# Patient Record
Sex: Female | Born: 1965 | Race: Black or African American | Hispanic: No | Smoking: Never smoker
Health system: Southern US, Community
[De-identification: ages and names within clinical notes are randomized; demographics above are authoritative.]

## PROBLEM LIST (undated history)

## (undated) DIAGNOSIS — R928 Other abnormal and inconclusive findings on diagnostic imaging of breast: Secondary | ICD-10-CM

## (undated) DIAGNOSIS — E669 Obesity, unspecified: Secondary | ICD-10-CM

## (undated) DIAGNOSIS — I10 Essential (primary) hypertension: Secondary | ICD-10-CM

## (undated) DIAGNOSIS — Z1389 Encounter for screening for other disorder: Secondary | ICD-10-CM

## (undated) DIAGNOSIS — Z1239 Encounter for other screening for malignant neoplasm of breast: Secondary | ICD-10-CM

## (undated) DIAGNOSIS — O24419 Gestational diabetes mellitus in pregnancy, unspecified control: Secondary | ICD-10-CM

## (undated) DIAGNOSIS — L732 Hidradenitis suppurativa: Secondary | ICD-10-CM

## (undated) HISTORY — DX: Encounter for other screening for malignant neoplasm of breast: Z12.39

## (undated) HISTORY — DX: Essential (primary) hypertension: I10

## (undated) HISTORY — DX: Encounter for screening for other disorder: Z13.89

## (undated) HISTORY — DX: Obesity, unspecified: E66.9

## (undated) HISTORY — DX: Other abnormal and inconclusive findings on diagnostic imaging of breast: R92.8

## (undated) HISTORY — DX: Gestational diabetes mellitus in pregnancy, unspecified control: O24.419

## (undated) HISTORY — DX: Hidradenitis suppurativa: L73.2

---

## 1997-07-30 HISTORY — PX: BREAST REDUCTION SURGERY: SHX8

## 1998-04-15 ENCOUNTER — Ambulatory Visit (HOSPITAL_COMMUNITY): Admission: RE | Admit: 1998-04-15 | Discharge: 1998-04-15 | Payer: Self-pay | Admitting: Specialist

## 1998-04-23 ENCOUNTER — Encounter: Payer: Self-pay | Admitting: Specialist

## 1998-04-23 ENCOUNTER — Ambulatory Visit (HOSPITAL_COMMUNITY): Admission: RE | Admit: 1998-04-23 | Discharge: 1998-04-24 | Payer: Self-pay | Admitting: Specialist

## 1998-07-30 HISTORY — PX: REDUCTION MAMMAPLASTY: SUR839

## 2006-03-06 ENCOUNTER — Ambulatory Visit: Payer: Self-pay | Admitting: Family Medicine

## 2010-05-30 ENCOUNTER — Ambulatory Visit: Payer: Self-pay | Admitting: Internal Medicine

## 2010-06-21 ENCOUNTER — Ambulatory Visit: Payer: Self-pay | Admitting: Internal Medicine

## 2010-06-29 ENCOUNTER — Ambulatory Visit: Payer: Self-pay | Admitting: Internal Medicine

## 2010-07-30 HISTORY — PX: ABDOMINAL HYSTERECTOMY: SHX81

## 2011-07-31 DIAGNOSIS — L732 Hidradenitis suppurativa: Secondary | ICD-10-CM

## 2011-07-31 DIAGNOSIS — R928 Other abnormal and inconclusive findings on diagnostic imaging of breast: Secondary | ICD-10-CM

## 2011-07-31 HISTORY — DX: Other abnormal and inconclusive findings on diagnostic imaging of breast: R92.8

## 2011-07-31 HISTORY — DX: Hidradenitis suppurativa: L73.2

## 2012-01-21 ENCOUNTER — Ambulatory Visit: Payer: Self-pay | Admitting: Obstetrics & Gynecology

## 2012-01-21 LAB — CBC
HCT: 27.3 % — ABNORMAL LOW (ref 35.0–47.0)
HGB: 8.2 g/dL — ABNORMAL LOW (ref 12.0–16.0)
MCH: 18.9 pg — ABNORMAL LOW (ref 26.0–34.0)
MCHC: 29.9 g/dL — ABNORMAL LOW (ref 32.0–36.0)
RBC: 4.31 10*6/uL (ref 3.80–5.20)
RDW: 18 % — ABNORMAL HIGH (ref 11.5–14.5)
WBC: 5.4 10*3/uL (ref 3.6–11.0)

## 2012-01-21 LAB — PREGNANCY, URINE: Pregnancy Test, Urine: NEGATIVE m[IU]/mL

## 2012-01-28 HISTORY — PX: PARTIAL HYSTERECTOMY: SHX80

## 2012-01-29 ENCOUNTER — Ambulatory Visit: Payer: Self-pay | Admitting: Obstetrics & Gynecology

## 2012-01-29 LAB — PREGNANCY, URINE: Pregnancy Test, Urine: NEGATIVE m[IU]/mL

## 2012-01-30 LAB — PATHOLOGY REPORT

## 2012-01-30 LAB — HEMOGLOBIN: HGB: 6.6 g/dL — ABNORMAL LOW (ref 12.0–16.0)

## 2012-03-06 ENCOUNTER — Ambulatory Visit: Payer: Self-pay | Admitting: Family Medicine

## 2012-03-14 ENCOUNTER — Ambulatory Visit: Payer: Self-pay | Admitting: Family Medicine

## 2012-04-14 ENCOUNTER — Ambulatory Visit: Payer: Self-pay | Admitting: General Surgery

## 2012-04-14 HISTORY — PX: BREAST BIOPSY: SHX20

## 2012-04-15 LAB — PATHOLOGY REPORT

## 2012-08-21 IMAGING — US ULTRASOUND LEFT BREAST
1 series · 14 of 17 positions shown · non-contrast
Comparison: none

REASON FOR EXAM: av lt asymmetric density
COMMENTS:

PROCEDURE:     US  - US BREAST LEFT  - March 14, 2012 [DATE]
RESULT:
The left breast is evaluated in the region of interest from the 3 o'clock to
the 6 o'clock position. No solid or cystic sonographic abnormalities are
identified.

[Series 1: ultrasound left breast · 0.10mm/px · 14 of 17 slices shown]
[im 1/17]
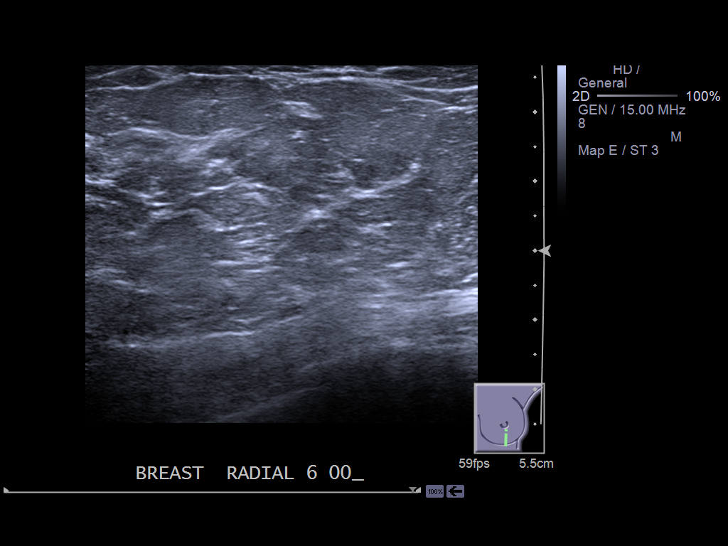
[im 2/17]
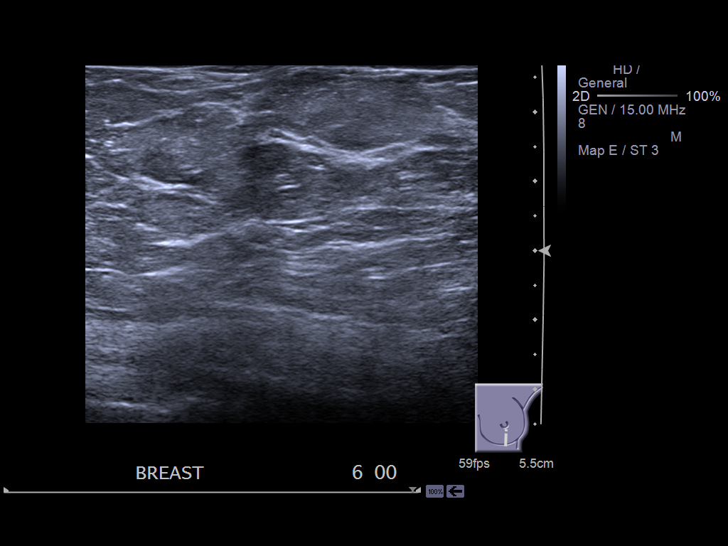
[im 4/17]
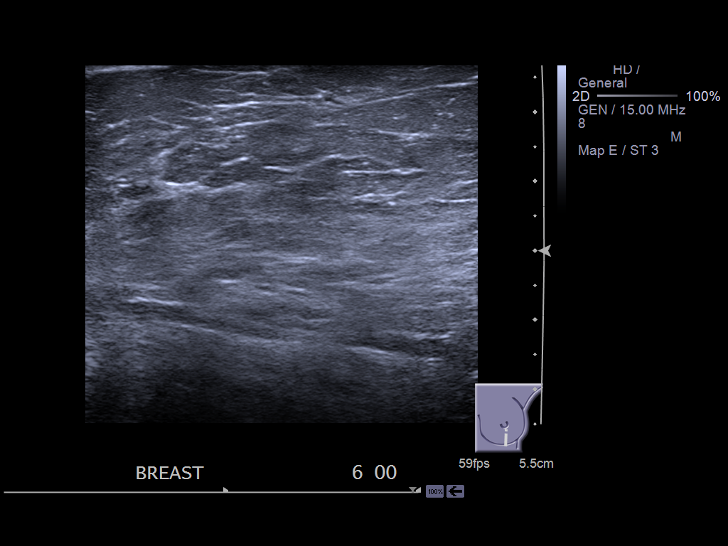
[im 5/17]
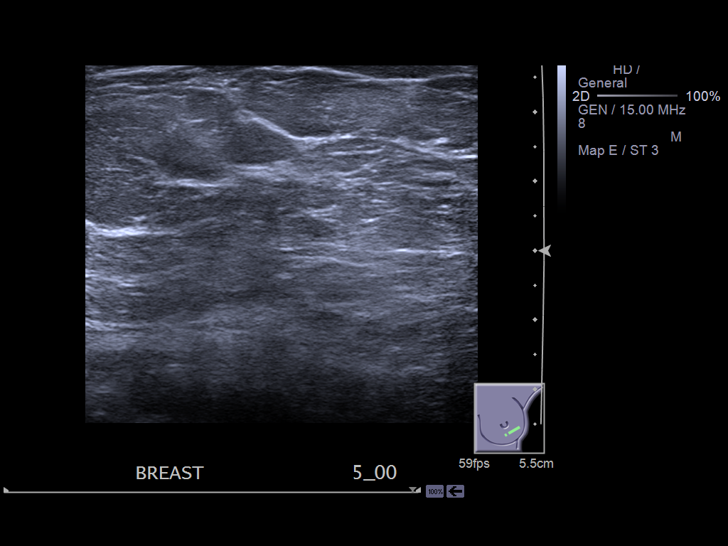
[im 6/17]
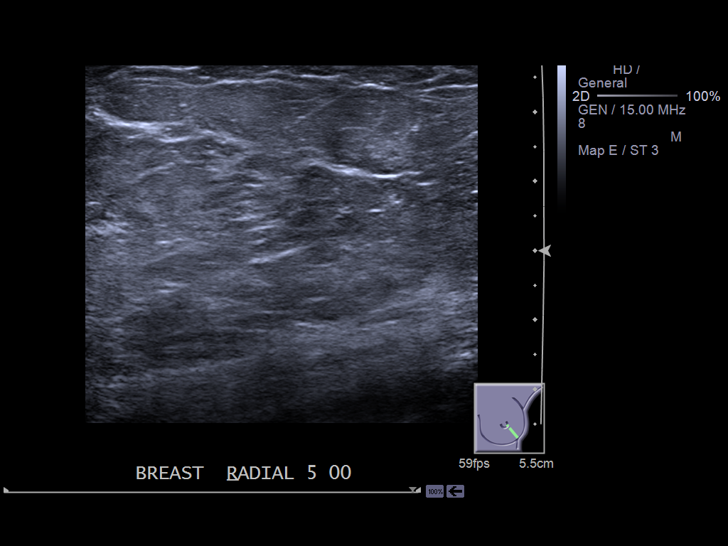
[im 7/17]
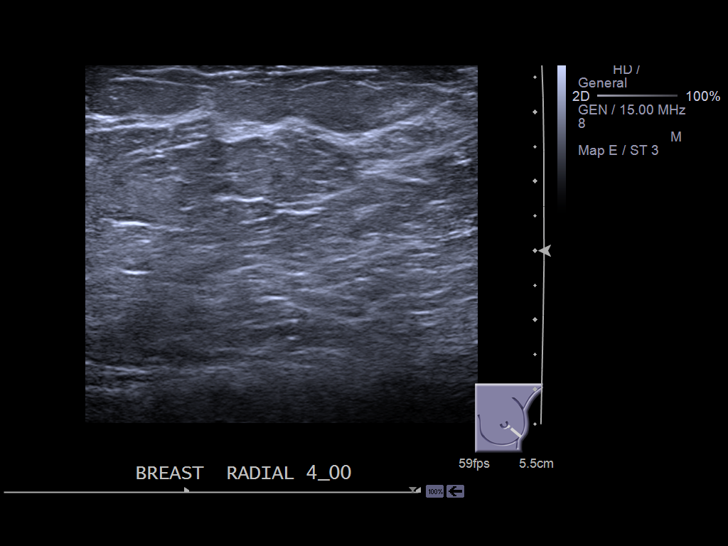
[im 8/17]
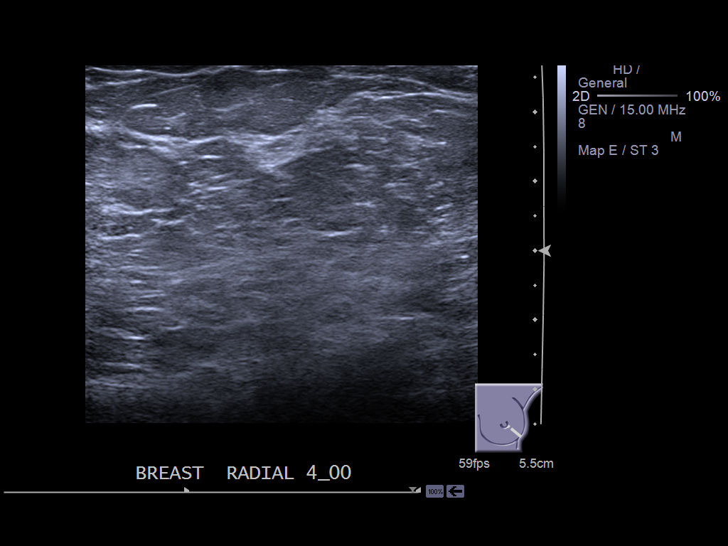
[im 10/17]
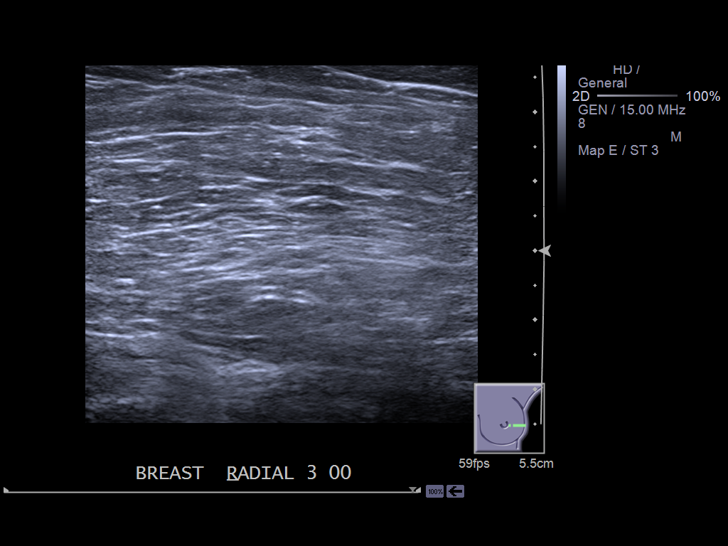
[im 11/17]
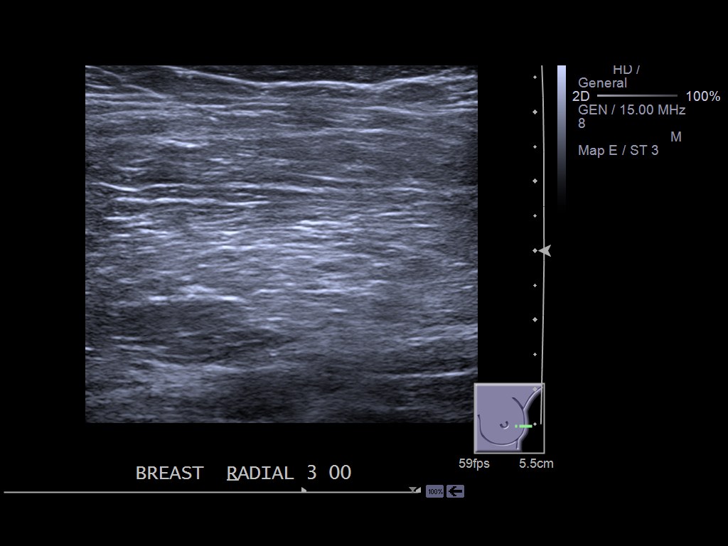
[im 12/17]
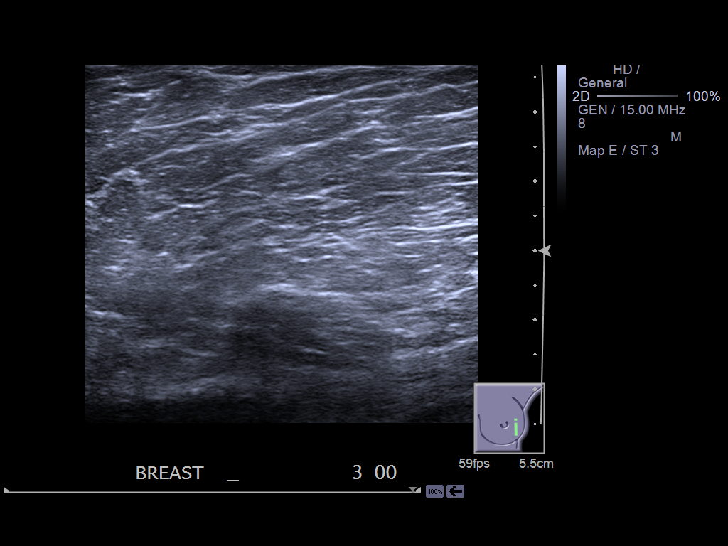
[im 13/17]
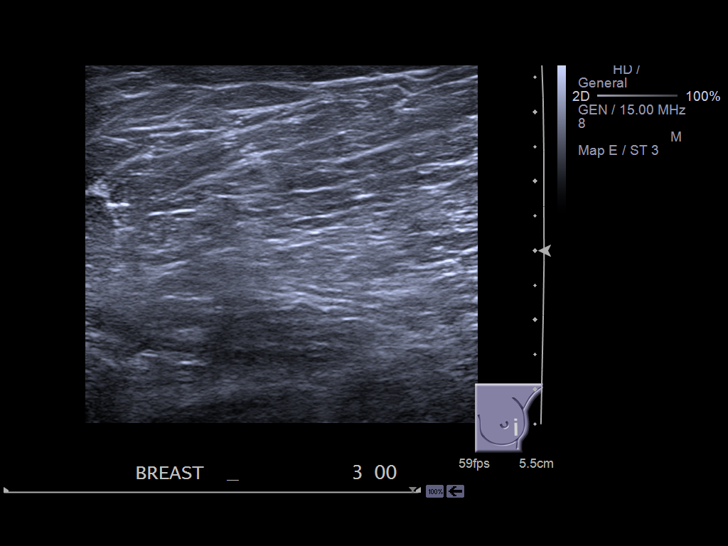
[im 14/17]
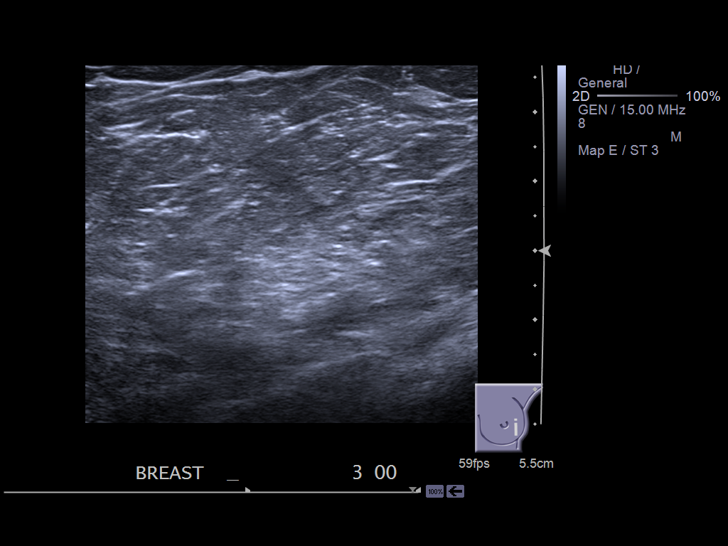
[im 16/17]
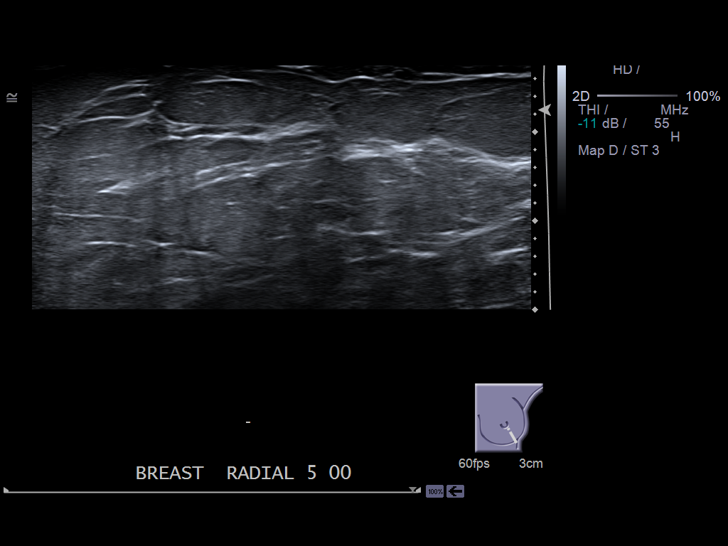
[im 17/17]
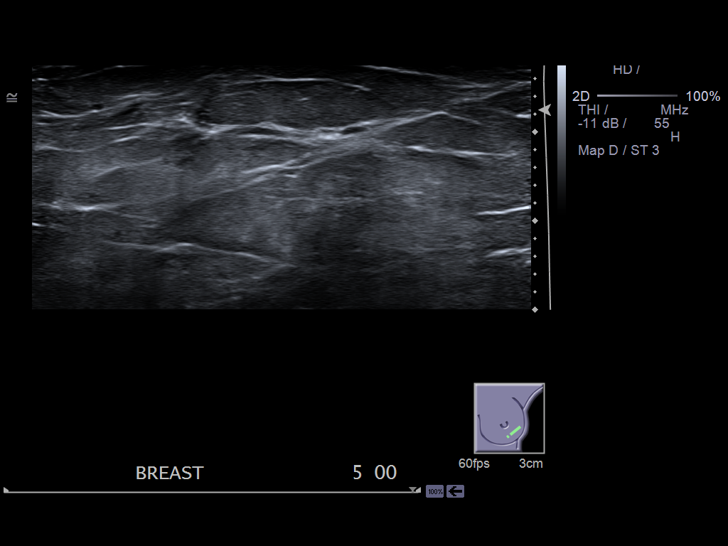

[14 of 17 positions shown; findings below may reference images not displayed]

IMPRESSION: Unremarkable Focused Left Breast Ultrasound. Please refer to
the additional radiographic view dictation for completed discussion.

## 2012-09-06 ENCOUNTER — Encounter: Payer: Self-pay | Admitting: General Surgery

## 2012-10-27 ENCOUNTER — Ambulatory Visit: Payer: Self-pay | Admitting: General Surgery

## 2012-11-26 ENCOUNTER — Ambulatory Visit: Payer: Self-pay | Admitting: General Surgery

## 2012-12-02 ENCOUNTER — Encounter: Payer: Self-pay | Admitting: General Surgery

## 2012-12-03 ENCOUNTER — Encounter: Payer: Self-pay | Admitting: *Deleted

## 2012-12-03 ENCOUNTER — Ambulatory Visit: Payer: Self-pay | Admitting: Family Medicine

## 2013-02-09 ENCOUNTER — Ambulatory Visit: Payer: Self-pay | Admitting: Family Medicine

## 2013-06-10 ENCOUNTER — Ambulatory Visit: Payer: Self-pay | Admitting: Family Medicine

## 2014-05-03 ENCOUNTER — Ambulatory Visit: Payer: Self-pay | Admitting: General Surgery

## 2014-05-11 ENCOUNTER — Ambulatory Visit (INDEPENDENT_AMBULATORY_CARE_PROVIDER_SITE_OTHER): Payer: BC Managed Care – PPO | Admitting: General Surgery

## 2014-05-11 ENCOUNTER — Encounter: Payer: Self-pay | Admitting: General Surgery

## 2014-05-11 VITALS — BP 130/80 | HR 86 | Resp 14 | Ht 65.0 in | Wt 201.0 lb

## 2014-05-11 DIAGNOSIS — L732 Hidradenitis suppurativa: Secondary | ICD-10-CM

## 2014-05-11 NOTE — Patient Instructions (Signed)
Patient to return in 2-3 weeks for follow up. The patient is aware to call back for any questions or concerns.

## 2014-05-11 NOTE — Progress Notes (Signed)
Patient ID: Sara Foley, female   DOB: [birth year], 48 y.o.   MRN: [id number]  Chief Complaint  Patient presents with  . Mass    left inner thigh    HPI Sara Foley is a 48 y.o. female.  Here today for evaluation of a left inner thigh nodule. States that she noticed it about 3-4 weeks ago. She states the area is draining and sore at times. The area has gotten larger. Currently on Augmentin for the past two weeks. She reports this problem has been recurring for quit sometime. In the past she had similar problems in right groin but resolved fully.   HPI  Past Medical History  Diagnosis Date  . Gestational diabetes     1996  . Unspecified essential hypertension   . Hidradenitis 2013    hidradentitis suppurativa  . Abnormal mammogram, unspecified 2013    illdefined left inferior density; ultrasound was normal  . Breast screening, unspecified   . Obesity, unspecified   . Screening for obesity     Past Surgical History  Procedure Laterality Date  . Partial hysterectomy  July 2013    due to fibroid tumors  . Breast reduction surgery  1999  . Breast biopsy Left 04-14-12    left breast stereotactic biopsy; fibroadenoma  . Abdominal hysterectomy  2012    partial    Family History  Problem Relation Age of Onset  . Prostate cancer Other   . Colon cancer Maternal Uncle     Social History History  Substance Use Topics  . Smoking status: Never Smoker   . Smokeless tobacco: Not on file  . Alcohol Use: 0.5 oz/week    1 drink(s) per week    No Known Allergies  Current Outpatient Prescriptions  Medication Sig Dispense Refill  . amoxicillin-clavulanate (AUGMENTIN) 875-125 MG per tablet       . ibuprofen (ADVIL,MOTRIN) 200 MG tablet Take 400 mg by mouth as needed.       No current facility-administered medications for this visit.    Review of Systems Review of Systems  Constitutional: Negative.   Respiratory: Negative.   Cardiovascular: Negative.     Blood pressure  130/80, pulse 86, resp. rate 14, height 5\' 5"  (1.651 m), weight 201 lb (91.173 kg).  Physical Exam Physical Exam  Constitutional: She is oriented to person, place, and time. She appears well-developed and well-nourished.  Lymphadenopathy:       Right: No inguinal adenopathy present.       Left: No inguinal adenopathy present.  Neurological: She is alert and oriented to person, place, and time.  Skin: Skin is warm and dry.  Large area of hidradenitis located in the left upper inner thigh extending to the labia.   The portion nearer to labia appears to be healed areas of hidradenitis. Lower part of this is active and measures 6 by 5 cm with one spot draiing pus-yellowish grey. Culture obtained. Heale area of hidradenitis in right groin and thigh. Data Reviewed none  Assessment    Active hidradenitis left upper inner thigh.      Plan    Await culture report. Adjust antibiotic as needed. Local hygiene discussed. Recheck in 2-3 weeks.        SANKAR,SEEPLAPUTHUR G 05/11/2014, 8:08 PM

## 2014-05-17 LAB — ANAEROBIC AND AEROBIC CULTURE

## 2014-05-31 ENCOUNTER — Encounter: Payer: Self-pay | Admitting: General Surgery

## 2014-06-01 ENCOUNTER — Ambulatory Visit (INDEPENDENT_AMBULATORY_CARE_PROVIDER_SITE_OTHER): Payer: BC Managed Care – PPO | Admitting: General Surgery

## 2014-06-01 ENCOUNTER — Encounter: Payer: Self-pay | Admitting: General Surgery

## 2014-06-01 VITALS — BP 130/90 | HR 84 | Resp 14 | Ht 65.0 in | Wt 198.0 lb

## 2014-06-01 DIAGNOSIS — L732 Hidradenitis suppurativa: Secondary | ICD-10-CM

## 2014-06-01 MED ORDER — AMOXICILLIN-POT CLAVULANATE 875-125 MG PO TABS: 1.0000 | ORAL_TABLET | Freq: Two times a day (BID) | ORAL | Status: AC

## 2014-06-01 NOTE — Progress Notes (Signed)
Patient ID: Sara Foley, female   DOB: 1965/10/19, 48 y.o.   MRN: [id number]  Chief Complaint  Patient presents with  . Follow-up    follow up skin lesion thigh    HPI Sara Foley is a 48 y.o. female who presents for a 3 week follow up of a skin lesion on her thigh. The patient states the area is a little better since her last visit. She is still taking her antibiotic but has not been consistent with it-in fact she has often not taken the morning dose. She states there is still some drainage but has decreased in amount.   HPI  Past Medical History  Diagnosis Date  . Gestational diabetes     1996  . Unspecified essential hypertension   . Hidradenitis 2013    hidradentitis suppurativa  . Abnormal mammogram, unspecified 2013    illdefined left inferior density; ultrasound was normal  . Breast screening, unspecified   . Obesity, unspecified   . Screening for obesity     Past Surgical History  Procedure Laterality Date  . Partial hysterectomy  July 2013    due to fibroid tumors  . Breast reduction surgery  1999  . Breast biopsy Left 04-14-12    left breast stereotactic biopsy; fibroadenoma  . Abdominal hysterectomy  2012    partial    Family History  Problem Relation Age of Onset  . Prostate cancer Other   . Colon cancer Maternal Uncle     Social History History  Substance Use Topics  . Smoking status: Never Smoker   . Smokeless tobacco: Not on file  . Alcohol Use: 0.5 oz/week    1 drink(s) per week    No Known Allergies  Current Outpatient Prescriptions  Medication Sig Dispense Refill  . amoxicillin-clavulanate (AUGMENTIN) 875-125 MG per tablet     . ibuprofen (ADVIL,MOTRIN) 200 MG tablet Take 400 mg by mouth as needed.    Marland Kitchen. amoxicillin-clavulanate (AUGMENTIN) 875-125 MG per tablet Take 1 tablet by mouth 2 (two) times daily. 10 tablet 0   No current facility-administered medications for this visit.    Review of Systems Review of Systems   Constitutional: Negative.   Respiratory: Negative.   Cardiovascular: Negative.     Blood pressure 130/90, pulse 84, resp. rate 14, height 5\' 5"  (1.651 m), weight 198 lb (89.812 kg).  Physical Exam Physical Exam  Constitutional: She is oriented to person, place, and time. She appears well-developed and well-nourished.  Neurological: She is alert and oriented to person, place, and time.  Skin: Skin is warm and dry.  Left inner thigh shows similar finding with 6 x 5 cm area of active hidradenitis. Located upper portion of inner thigh. Healed areas extending above this to the left labia.  Basicall no change from last visit 2-3 weeks ago  Data Reviewed Culture has grown Streptococcus.   Assessment    Active hidradenitis left thigh. No improved since initial visit. Pt has not been taking her antibiotic routinely    Plan    Patient advised of proper use of antibiotic. She had about 5 days worth Augmentin left, Rx sent for additional 5 days.Patient to return in 3 weeks for follow up.        Sara Foley 06/01/2014, 2:23 PM

## 2014-06-01 NOTE — Patient Instructions (Signed)
Patient to take antibiotic twice daily on a consistent basis. Patient to return in . The patient is aware to call back for any questions or concerns.

## 2014-06-23 ENCOUNTER — Ambulatory Visit: Payer: BC Managed Care – PPO | Admitting: General Surgery

## 2014-07-14 ENCOUNTER — Encounter: Payer: Self-pay | Admitting: *Deleted

## 2014-11-21 NOTE — Op Note (Signed)
PATIENT NAME:  Sara Foley, Sara Foley MR#:  [id number] DATE OF BIRTH:  1966/07/20  DATE OF PROCEDURE:  01/29/2012  PREOPERATIVE DIAGNOSES:  1. Menorrhagia. 2. Anemia. 3. Fibroid uterus.   POSTOPERATIVE DIAGNOSES:  1. Menorrhagia. 2. Anemia. 3. Fibroid uterus.   PROCEDURE: Laparoscopic supracervical hysterectomy.   SURGEON: Dierdre Searles. Sara Lavonia Eager, MD   ASSISTANT: Sara LangeLashawn Weaver-Lee, MD   ANESTHESIA: General.   ESTIMATED BLOOD LOSS: 50 mL.   COMPLICATIONS: None.   FINDINGS: Enlarged uterus, adhesions of the omentum to the anterior surface of the uterus, bilateral paratubal cyst and a small left ovarian cyst, normal appendix, liver, bowel and colon.   DISPOSITION: To recovery room in stable condition.   TECHNIQUE: The patient is prepped and draped in the usual sterile fashion after adequate anesthesia is obtained in the dorsal lithotomy position. A sponge stick is placed per vagina for manipulation purposes. A Foley catheter is also inserted.   Attention is then turned to the abdomen where a Veress needle was inserted through a 5 mm infraumbilical incision after Marcaine is used to anesthetize the skin. Veress needle placement is confirmed using the hanging drop technique and the abdomen is then insufflated with CO2 gas. A 5 mm trocar is then inserted under direct visualization with the laparoscope with no injuries or bleeding noted. The patient is placed in Trendelenburg position with the above-mentioned findings visualized. There were no adhesions in the area of the trocar placement. Right and left trocars are placed after Marcaine is used to anesthetize the skin. An 11 mm trocar is placed in the right lower quadrant and a 5 mm trocar is placed in the left lower quadrant lateral to the inferior epigastric blood vessels.   Using the 5 mm Harmonic scalpel, the adhesions of the omentum to the anterior surface of the uterus were carefully dissected with no bleeding noted. Uterus is then grasped with a  tenaculum and the uterine ovarian blood vessels and ligaments are carefully clamped, transected, and then cauterized using Bovie cautery device as well as the Harmonic scalpel to dissect down the level of the cardinal ligaments to the level of the uterine arteries. Uterine arteries are carefully coagulated using bipolar cautery device and the uterus is amputated at the level of the cervix with approximately 1 cm length of cervix left inside. Endocervical canal is cauterized using bipolar cautery device. The pelvic cavity is irrigated with aspiration of all fluid and any blood clot seen.   A morcellator device is placed through the right lower quadrant incision and the uterus is removed by morcellation technique without complication. Reinspection of the pelvic cavity reveals excellent hemostasis and it is again irrigated. There is no apparent injury to ureter, bowel, bladder, or colon structures. Interceed is placed over the cervical stump to prevent adhesions.   The right lower quadrant incision is closed with a fascial closure device using a 0 Vicryl suture. Gas is expelled and trocars are removed. The patient is leveled. Skin is closed with Dermabond. The patient goes to the recovery room in stable condition. All sponge, instrument, and needle counts are correct.   ____________________________ R. Annamarie MajorPaul Shara Hartis, MD rph:drc Foley: 01/29/2012 10:32:59 ET T: 01/29/2012 12:46:42 ET JOB#: [id number]  cc: Dierdre Searles. Sara Lauro Manlove, MD, <Dictator> Nadara MustardOBERT P Derion Kreiter MD ELECTRONICALLY SIGNED 01/29/2012 16:26

## 2016-01-17 ENCOUNTER — Encounter: Payer: Self-pay | Admitting: Family Medicine

## 2016-01-17 ENCOUNTER — Other Ambulatory Visit: Payer: Self-pay | Admitting: Family Medicine

## 2016-01-17 ENCOUNTER — Other Ambulatory Visit: Payer: Self-pay

## 2016-01-17 ENCOUNTER — Ambulatory Visit (INDEPENDENT_AMBULATORY_CARE_PROVIDER_SITE_OTHER): Payer: BC Managed Care – PPO | Admitting: Family Medicine

## 2016-01-17 VITALS — BP 136/98 | HR 93 | Temp 97.9°F | Resp 16 | Wt 205.9 lb

## 2016-01-17 DIAGNOSIS — N63 Unspecified lump in breast: Secondary | ICD-10-CM | POA: Diagnosis not present

## 2016-01-17 DIAGNOSIS — L732 Hidradenitis suppurativa: Secondary | ICD-10-CM

## 2016-01-17 DIAGNOSIS — Z1239 Encounter for other screening for malignant neoplasm of breast: Secondary | ICD-10-CM | POA: Diagnosis not present

## 2016-01-17 DIAGNOSIS — E669 Obesity, unspecified: Secondary | ICD-10-CM | POA: Diagnosis not present

## 2016-01-17 DIAGNOSIS — Z8632 Personal history of gestational diabetes: Secondary | ICD-10-CM

## 2016-01-17 DIAGNOSIS — Z23 Encounter for immunization: Secondary | ICD-10-CM | POA: Diagnosis not present

## 2016-01-17 DIAGNOSIS — E66811 Obesity, class 1: Secondary | ICD-10-CM

## 2016-01-17 DIAGNOSIS — Z862 Personal history of diseases of the blood and blood-forming organs and certain disorders involving the immune mechanism: Secondary | ICD-10-CM | POA: Diagnosis not present

## 2016-01-17 DIAGNOSIS — M542 Cervicalgia: Secondary | ICD-10-CM | POA: Diagnosis not present

## 2016-01-17 DIAGNOSIS — Z124 Encounter for screening for malignant neoplasm of cervix: Secondary | ICD-10-CM

## 2016-01-17 DIAGNOSIS — Z01419 Encounter for gynecological examination (general) (routine) without abnormal findings: Secondary | ICD-10-CM

## 2016-01-17 DIAGNOSIS — I10 Essential (primary) hypertension: Secondary | ICD-10-CM

## 2016-01-17 DIAGNOSIS — Z0000 Encounter for general adult medical examination without abnormal findings: Secondary | ICD-10-CM

## 2016-01-17 DIAGNOSIS — N6320 Unspecified lump in the left breast, unspecified quadrant: Secondary | ICD-10-CM

## 2016-01-17 DIAGNOSIS — R5383 Other fatigue: Secondary | ICD-10-CM

## 2016-01-17 DIAGNOSIS — Z114 Encounter for screening for human immunodeficiency virus [HIV]: Secondary | ICD-10-CM | POA: Diagnosis not present

## 2016-01-17 DIAGNOSIS — Z13220 Encounter for screening for lipoid disorders: Secondary | ICD-10-CM

## 2016-01-17 LAB — CBC WITH DIFFERENTIAL/PLATELET
Basophils Absolute: 0 cells/uL (ref 0–200)
Basophils Relative: 0 %
Eosinophils Absolute: 67 cells/uL (ref 15–500)
Eosinophils Relative: 1 %
HCT: 43.6 % (ref 35.0–45.0)
Hemoglobin: 14.1 g/dL (ref 11.7–15.5)
Lymphocytes Relative: 18 %
Lymphs Abs: 1206 cells/uL (ref 850–3900)
MCH: 25.7 pg — ABNORMAL LOW (ref 27.0–33.0)
MCHC: 32.3 g/dL (ref 32.0–36.0)
MCV: 79.4 fL — ABNORMAL LOW (ref 80.0–100.0)
MPV: 10.1 fL (ref 7.5–12.5)
Monocytes Absolute: 469 cells/uL (ref 200–950)
Monocytes Relative: 7 %
Neutro Abs: 4958 cells/uL (ref 1500–7800)
Neutrophils Relative %: 74 %
Platelets: 327 10*3/uL (ref 140–400)
RBC: 5.49 MIL/uL — ABNORMAL HIGH (ref 3.80–5.10)
RDW: 14.9 % (ref 11.0–15.0)
WBC: 6.7 10*3/uL (ref 3.8–10.8)

## 2016-01-17 LAB — HEMOGLOBIN A1C
Hgb A1c MFr Bld: 6 % — ABNORMAL HIGH (ref <5.7)
Mean Plasma Glucose: 126 mg/dL

## 2016-01-17 LAB — VITAMIN B12: Vitamin B-12: 291 pg/mL (ref 200–1100)

## 2016-01-17 LAB — TSH: TSH: 1.49 mIU/L

## 2016-01-17 MED ORDER — LISINOPRIL-HYDROCHLOROTHIAZIDE 10-12.5 MG PO TABS: 1.0000 | ORAL_TABLET | Freq: Every day | ORAL | Status: DC

## 2016-01-17 NOTE — Progress Notes (Signed)
Name: Sara Foley   MRN: [id number]    DOB: 1966-07-23   Date:01/17/2016       Progress Note  Subjective  Chief Complaint  Chief Complaint  Patient presents with  . Annual Exam  . Stress    patient stated that she has had some increased stress lately  . Immunizations    tdap  . Labs Only  . Orders    mammogram    HPI  Well Woman: she is divorced, not sexually active since separation almost 4 years ago. No vaginal discharge or dysuria.   Obesity; she continues to gain weight, but slowly, she has a history of gestational DM - no polyphagia, polydipsia or polyuria. She is avoiding carbohydrates  HTN: not taking bp medications, bp is elevated, explained importance of compliance of medication. No chest pain or palpitation  Fatigue: she does not seem to have enough sleep. Falls asleep between 10-12 am, wakes up at 5 am. Discussed sleep hygiene. We will check labs.   Neck spasm: she states she gets very tense on her neck, has spasms and does not like to take medication. We will try massage therapy.    Patient Active Problem List   Diagnosis Date Noted  . Obesity (BMI 30.0-34.9) 01/17/2016  . History of gestational diabetes 01/17/2016  . Hypertension, benign 01/17/2016  . Hidradenitis suppurativa 01/17/2016  . History of iron deficiency anemia 01/17/2016  . Neck pain 01/17/2016    Past Surgical History  Procedure Laterality Date  . Partial hysterectomy  July 2013    due to fibroid tumors  . Breast reduction surgery  1999  . Breast biopsy Left 04-14-12    left breast stereotactic biopsy; fibroadenoma  . Abdominal hysterectomy  2012    partial    Family History  Problem Relation Age of Onset  . Prostate cancer Other   . Colon cancer Maternal Uncle     Social History   Social History  . Marital Status: Divorced    Spouse Name: N/A  . Number of Children: N/A  . Years of Education: N/A   Occupational History  . Not on file.   Social History Main Topics  .  Smoking status: Never Smoker   . Smokeless tobacco: Not on file  . Alcohol Use: 0.5 oz/week    1 drink(s) per week  . Drug Use: No  . Sexual Activity: Not on file   Other Topics Concern  . Not on file   Social History Narrative    No current outpatient prescriptions on file.  No Known Allergies   ROS  Constitutional: Negative for fever, positive for weight change.  Respiratory: Negative for cough and shortness of breath.   Cardiovascular: Negative for chest pain or palpitations.  Gastrointestinal: Negative for abdominal pain, no bowel changes.  Musculoskeletal: Negative for gait problem or joint swelling.  Skin: Negative for rash.  Neurological: Negative for dizziness or headache.  No other specific complaints in a complete review of systems (except as listed in HPI above).  Objective  Filed Vitals:   01/17/16 1419  BP: 136/98  Pulse: 93  Temp: 97.9 F (36.6 C)  TempSrc: Oral  Resp: 16  Weight: 205 lb 14.4 oz (93.396 kg)  SpO2: 99%    Body mass index is 34.26 kg/(m^2).  Physical Exam  Constitutional: Patient appears well-developed and well-nourished. No distress.  HENT: Head: Normocephalic and atraumatic. Ears: B TMs ok, no erythema or effusion; Nose: Nose normal. Mouth/Throat: Oropharynx is clear and moist.  No oropharyngeal exudate.  Eyes: Conjunctivae and EOM are normal. Pupils are equal, round, and reactive to light. No scleral icterus.  Neck: Normal range of motion. Neck supple. No JVD present. No thyromegaly present.  Cardiovascular: Normal rate, regular rhythm and normal heart sounds.  No murmur heard. No BLE edema. Pulmonary/Chest: Effort normal and breath sounds normal. No respiratory distress. Abdominal: Soft. Bowel sounds are normal, no distension. There is no tenderness. no masses Breast: large lump at left inner lower breast, no nipple discharge or rashes FEMALE GENITALIA:  External genitalia normal External urethra normal Vaginal vault normal  without discharge or lesions Cervix normal  Bimanual exam normal without masses RECTAL: not done Musculoskeletal: Normal range of motion, no joint effusions. No gross deformities Neurological: he is alert and oriented to person, place, and time. No cranial nerve deficit. Coordination, balance, strength, speech and gait are normal.  Skin: Skin is warm and dry. Hydradenitis suppurative, large indurated area on left inner thigh  Psychiatric: Patient has a normal mood and affect. behavior is normal. Judgment and thought content normal.  PHQ2/9: Depression screen PHQ 2/9 01/17/2016  Decreased Interest 0  Down, Depressed, Hopeless 0  PHQ - 2 Score 0     Fall Risk: Fall Risk  01/17/2016  Falls in the past year? No     Functional Status Survey: Is the patient deaf or have difficulty hearing?: No Does the patient have difficulty seeing, even when wearing glasses/contacts?: No Does the patient have difficulty concentrating, remembering, or making decisions?: No Does the patient have difficulty walking or climbing stairs?: No Does the patient have difficulty dressing or bathing?: No Does the patient have difficulty doing errands alone such as visiting a doctor's office or shopping?: No    Assessment & Plan  1. Well woman exam  Discussed importance of 150 minutes of physical activity weekly, eat two servings of fish weekly, eat one serving of tree nuts ( cashews, pistachios, pecans, almonds.Marland Kitchen.) every other day, eat 6 servings of fruit/vegetables daily and drink plenty of water and avoid sweet beverages.   2. Obesity (BMI 30.0-34.9)  Discussed with the patient the risk posed by an increased BMI. Discussed importance of portion control, calorie counting and at least 150 minutes of physical activity weekly. Avoid sweet beverages and drink more water. Eat at least 6 servings of fruit and vegetables daily   3. History of gestational diabetes  - Hemoglobin A1c  4. Hypertension, benign  -  lisinopril-hydrochlorothiazide (PRINZIDE,ZESTORETIC) 10-12.5 MG tablet; Take 1 tablet by mouth daily.  Dispense: 30 tablet; Refill: 1  5. History of iron deficiency anemia  - CBC with Differential/Platelet  6. Neck pain  - PT massage; Future  7. Encounter for screening for HIV  - HIV antibody  8. Breast cancer screening  - MM Digital Screening; Future  9. Lipid screening  - Lipid panel  10. Other fatigue  - Comprehensive metabolic panel - VITAMIN D 25 Hydroxy (Vit-D Deficiency, Fractures) - Vitamin B12 - TSH  11. Need for Tdap vaccination  - Tdap vaccine greater than or equal to 7yo IM  12. Left breast lump  - MM Digital Diagnostic Bilat; Future - US BREAST LTD UNI LEFT INC AXILLA; Future  13. Hidradenitis suppurativa  - Ambulatory referral to General Surgery  14. Cervical cancer screening  - Pap IG, CT/NG NAA, and HPV (high risk)

## 2016-01-18 ENCOUNTER — Other Ambulatory Visit: Payer: Self-pay | Admitting: Family Medicine

## 2016-01-18 DIAGNOSIS — E559 Vitamin D deficiency, unspecified: Secondary | ICD-10-CM

## 2016-01-18 DIAGNOSIS — E538 Deficiency of other specified B group vitamins: Secondary | ICD-10-CM

## 2016-01-18 LAB — COMPREHENSIVE METABOLIC PANEL
ALBUMIN: 3.9 g/dL (ref 3.6–5.1)
ALT: 11 U/L (ref 6–29)
AST: 14 U/L (ref 10–35)
Alkaline Phosphatase: 112 U/L (ref 33–115)
BUN: 12 mg/dL (ref 7–25)
CHLORIDE: 101 mmol/L (ref 98–110)
CO2: 25 mmol/L (ref 20–31)
Calcium: 8.7 mg/dL (ref 8.6–10.2)
Creat: 0.79 mg/dL (ref 0.50–1.10)
Glucose, Bld: 84 mg/dL (ref 65–99)
POTASSIUM: 4.4 mmol/L (ref 3.5–5.3)
Sodium: 141 mmol/L (ref 135–146)
TOTAL PROTEIN: 7.9 g/dL (ref 6.1–8.1)
Total Bilirubin: 0.3 mg/dL (ref 0.2–1.2)

## 2016-01-18 LAB — PAP IG, CT-NG NAA, HPV HIGH-RISK
Chlamydia Probe Amp: NOT DETECTED
GC Probe Amp: NOT DETECTED
HPV DNA HIGH RISK: NOT DETECTED

## 2016-01-18 LAB — VITAMIN D 25 HYDROXY (VIT D DEFICIENCY, FRACTURES): VIT D 25 HYDROXY: 11 ng/mL — AB (ref 30–100)

## 2016-01-18 LAB — LIPID PANEL
CHOL/HDL RATIO: 3.4 ratio (ref <=5.0)
Cholesterol: 152 mg/dL (ref 125–200)
HDL: 45 mg/dL — AB (ref >=46)
LDL CALC: 92 mg/dL (ref <130)
TRIGLYCERIDES: 76 mg/dL (ref <150)
VLDL: 15 mg/dL (ref <30)

## 2016-01-18 LAB — HIV ANTIBODY (ROUTINE TESTING W REFLEX): HIV: NONREACTIVE

## 2016-01-18 MED ORDER — VITAMIN D (ERGOCALCIFEROL) 1.25 MG (50000 UNIT) PO CAPS: 50000.0000 [IU] | ORAL_CAPSULE | ORAL | Status: DC

## 2016-01-18 MED ORDER — B-12 1000 MCG SL SUBL: 1.0000 | SUBLINGUAL_TABLET | Freq: Every day | SUBLINGUAL | Status: DC

## 2016-01-19 ENCOUNTER — Other Ambulatory Visit: Payer: Self-pay | Admitting: Family Medicine

## 2016-01-19 ENCOUNTER — Encounter: Payer: Self-pay | Admitting: Family Medicine

## 2016-01-19 DIAGNOSIS — Z90711 Acquired absence of uterus with remaining cervical stump: Secondary | ICD-10-CM | POA: Insufficient documentation

## 2016-01-19 MED ORDER — B-12 1000 MCG SL SUBL: 1.0000 | SUBLINGUAL_TABLET | Freq: Every day | SUBLINGUAL | Status: DC

## 2016-01-25 ENCOUNTER — Encounter: Payer: Self-pay | Admitting: *Deleted

## 2016-02-06 ENCOUNTER — Encounter: Payer: Self-pay | Admitting: General Surgery

## 2016-02-06 ENCOUNTER — Ambulatory Visit (INDEPENDENT_AMBULATORY_CARE_PROVIDER_SITE_OTHER): Payer: BC Managed Care – PPO | Admitting: General Surgery

## 2016-02-06 VITALS — BP 124/70 | HR 76 | Resp 12 | Ht 65.0 in | Wt 208.0 lb

## 2016-02-06 DIAGNOSIS — L732 Hidradenitis suppurativa: Secondary | ICD-10-CM | POA: Diagnosis not present

## 2016-02-06 MED ORDER — AMOXICILLIN-POT CLAVULANATE 875-125 MG PO TABS: 1.0000 | ORAL_TABLET | Freq: Two times a day (BID) | ORAL | Status: DC

## 2016-02-06 NOTE — Progress Notes (Signed)
Patient ID: Sara Foley, female   DOB: [birth year], 50 y.o.   MRN: [id number]  Chief Complaint  Patient presents with  . Other    Hidradenitis    HPI Sara Foley is a 50 y.o. female here today for a follow up of her hidradenitis. She was last seen for this in 2015. She reports that she does get this in her axillary region as well as the groin area. She states that it is much worse in the left inner thigharea. She has had this off and on for years. She reports she is getting more flare ups involving larger areas much more often.  I have reviewed the history of present illness with the patient.  HPI  Past Medical History  Diagnosis Date  . Gestational diabetes     1996  . Unspecified essential hypertension   . Hidradenitis 2013    hidradentitis suppurativa  . Abnormal mammogram, unspecified 2013    illdefined left inferior density; ultrasound was normal  . Breast screening, unspecified   . Obesity, unspecified   . Screening for obesity     Past Surgical History  Procedure Laterality Date  . Partial hysterectomy  July 2013    due to fibroid tumors  . Breast reduction surgery  1999  . Breast biopsy Left 04-14-12    left breast stereotactic biopsy; fibroadenoma  . Abdominal hysterectomy  2012    partial    Family History  Problem Relation Age of Onset  . Prostate cancer Other   . Colon cancer Maternal Uncle     Social History Social History  Substance Use Topics  . Smoking status: Never Smoker   . Smokeless tobacco: None  . Alcohol Use: 0.5 oz/week    1 drink(s) per week    No Known Allergies  Current Outpatient Prescriptions  Medication Sig Dispense Refill  . Cyanocobalamin (B-12) 1000 MCG SUBL Place 1 tablet under the tongue daily. 30 each 2  . lisinopril-hydrochlorothiazide (PRINZIDE,ZESTORETIC) 10-12.5 MG tablet Take 1 tablet by mouth daily. 30 tablet 1  . Vitamin D, Ergocalciferol, (DRISDOL) 50000 units CAPS capsule Take 1 capsule (50,000 Units total) by  mouth every 7 (seven) days. 12 capsule 0  . amoxicillin-clavulanate (AUGMENTIN) 875-125 MG tablet Take 1 tablet by mouth 2 (two) times daily. 20 tablet 0   No current facility-administered medications for this visit.    Review of Systems Review of Systems  Constitutional: Negative.   Respiratory: Negative.   Cardiovascular: Negative.     Blood pressure 124/70, pulse 76, resp. rate 12, height 5\' 5"  (1.651 m), weight 208 lb (94.348 kg).  Physical Exam Physical Exam  Constitutional: She is oriented to person, place, and time. She appears well-developed and well-nourished.  Eyes: Conjunctivae are normal. No scleral icterus.  Neck: Neck supple.  Cardiovascular: Normal rate, regular rhythm and normal heart sounds.   Pulmonary/Chest: Effort normal and breath sounds normal.  Abdominal: Soft. Bowel sounds are normal. There is no tenderness.  Lymphadenopathy:    She has no cervical adenopathy.  Neurological: She is alert and oriented to person, place, and time.  Skin: Skin is dry.       Data Reviewed Prior notes eviewed  Assessment    Hidradenitis left thigh. Will treat with antibiotics and reassess in 2-3 weeks. May benefit with excision.    Plan   Patient to return in two to three weeks.  AUGMENTIN sent to pharmacy BID for 10 days    PCP:  [place], This information  has been scribed by Ples Specter CMA.    Eleri Ruben G 02/09/2016, 2:28 PM

## 2016-02-06 NOTE — Patient Instructions (Addendum)
Patient to return in two to three weeks.

## 2016-02-09 ENCOUNTER — Encounter: Payer: Self-pay | Admitting: General Surgery

## 2016-02-12 LAB — ANAEROBIC AND AEROBIC CULTURE

## 2016-02-20 ENCOUNTER — Ambulatory Visit: Payer: BC Managed Care – PPO | Admitting: General Surgery

## 2016-02-28 ENCOUNTER — Ambulatory Visit (INDEPENDENT_AMBULATORY_CARE_PROVIDER_SITE_OTHER): Payer: BC Managed Care – PPO | Admitting: Family Medicine

## 2016-02-28 ENCOUNTER — Other Ambulatory Visit: Payer: Self-pay | Admitting: Family Medicine

## 2016-02-28 ENCOUNTER — Encounter: Payer: Self-pay | Admitting: Family Medicine

## 2016-02-28 VITALS — BP 128/78 | HR 89 | Temp 99.2°F | Resp 18 | Ht 65.0 in | Wt 204.6 lb

## 2016-02-28 DIAGNOSIS — E8881 Metabolic syndrome: Secondary | ICD-10-CM

## 2016-02-28 DIAGNOSIS — L732 Hidradenitis suppurativa: Secondary | ICD-10-CM

## 2016-02-28 DIAGNOSIS — E559 Vitamin D deficiency, unspecified: Secondary | ICD-10-CM

## 2016-02-28 DIAGNOSIS — I10 Essential (primary) hypertension: Secondary | ICD-10-CM | POA: Diagnosis not present

## 2016-02-28 DIAGNOSIS — E538 Deficiency of other specified B group vitamins: Secondary | ICD-10-CM

## 2016-02-28 DIAGNOSIS — E669 Obesity, unspecified: Secondary | ICD-10-CM | POA: Diagnosis not present

## 2016-02-28 DIAGNOSIS — E786 Lipoprotein deficiency: Secondary | ICD-10-CM

## 2016-02-28 DIAGNOSIS — N63 Unspecified lump in breast: Secondary | ICD-10-CM

## 2016-02-28 DIAGNOSIS — N6320 Unspecified lump in the left breast, unspecified quadrant: Secondary | ICD-10-CM

## 2016-02-28 MED ORDER — CYANOCOBALAMIN 1000 MCG/ML IJ SOLN
1000.0000 ug | Freq: Once | INTRAMUSCULAR | Status: AC
  Administered 2016-02-28: 1000 ug via INTRAMUSCULAR

## 2016-02-28 MED ORDER — LISINOPRIL-HYDROCHLOROTHIAZIDE 10-12.5 MG PO TABS: 1.0000 | ORAL_TABLET | Freq: Every day | ORAL | 5 refills | Status: DC

## 2016-02-28 NOTE — Progress Notes (Signed)
Name: Sara Foley   MRN: [id number]    DOB: November 01, 1965   Date:02/28/2016       Progress Note  Subjective  Chief Complaint  Chief Complaint  Patient presents with  . Hypertension    pt here for 6 week follow up    HPI  HTN: she is taking bp medication as prescribed, she denies chest pain, palpitation or SOB. BP is at goal today  Vitamin D deficiency: she is taking rx vitamin D weekly, she states she is feeling better.   Hidradenitis Suppurative: seen by Dr. Jamal Collin recently and is taking antibiotics at this time, skipping doses, no fever, she states it is doing better in the inner thigh. May need surgery  B12 deficiency: she did not start SL supplementation.   Metabolic Syndrome: she denies polyphagia, polydipsia or polyuria  Dyslipidemia: to improve HDL patient  needs to eat tree nuts ( pecans/pistachios/almonds ) four times weekly, eat fish two times weekly  and exercise  at least 150 minutes per week   Patient Active Problem List   Diagnosis Date Noted  . History of hysterectomy leaving cervix intact 01/19/2016  . Vitamin D deficiency 01/18/2016  . Vitamin B12 deficiency 01/18/2016  . Obesity (BMI 30.0-34.9) 01/17/2016  . History of gestational diabetes 01/17/2016  . Hypertension, benign 01/17/2016  . Hidradenitis suppurativa 01/17/2016  . History of iron deficiency anemia 01/17/2016  . Neck pain 01/17/2016    Past Surgical History:  Procedure Laterality Date  . ABDOMINAL HYSTERECTOMY  2012   partial  . BREAST BIOPSY Left 04-14-12   left breast stereotactic biopsy; fibroadenoma  . BREAST REDUCTION SURGERY  1999  . PARTIAL HYSTERECTOMY  July 2013   due to fibroid tumors    Family History  Problem Relation Age of Onset  . Prostate cancer Other   . Colon cancer Maternal Uncle     Social History   Social History  . Marital status: Divorced    Spouse name: N/A  . Number of children: N/A  . Years of education: N/A   Occupational History  . Not on file.    Social History Main Topics  . Smoking status: Never Smoker  . Smokeless tobacco: Never Used  . Alcohol use 0.5 oz/week    1 Standard drinks or equivalent per week  . Drug use: No  . Sexual activity: Not on file   Other Topics Concern  . Not on file   Social History Narrative  . No narrative on file     Current Outpatient Prescriptions:  .  Cyanocobalamin (B-12) 1000 MCG SUBL, Place 1 tablet under the tongue daily., Disp: 30 each, Rfl: 2 .  lisinopril-hydrochlorothiazide (PRINZIDE,ZESTORETIC) 10-12.5 MG tablet, Take 1 tablet by mouth daily., Disp: 30 tablet, Rfl: 5 .  Vitamin D, Ergocalciferol, (DRISDOL) 50000 units CAPS capsule, Take 1 capsule (50,000 Units total) by mouth every 7 (seven) days., Disp: 12 capsule, Rfl: 0 .  amoxicillin-clavulanate (AUGMENTIN) 875-125 MG tablet, Take 1 tablet by mouth 2 (two) times daily. (Patient not taking: Reported on 02/28/2016), Disp: 20 tablet, Rfl: 0  Current Facility-Administered Medications:  .  cyanocobalamin ((VITAMIN B-12)) injection 1,000 mcg, 1,000 mcg, Intramuscular, Once, Steele Sizer, MD  No Known Allergies   ROS  Constitutional: Negative for fever , mild weight change.  Respiratory: Negative for cough and shortness of breath.   Cardiovascular: Negative for chest pain or palpitations.  Gastrointestinal: Negative for abdominal pain, no bowel changes.  Musculoskeletal: Negative for gait problem or joint swelling.  Skin: Negative for rash.  Neurological: Negative for dizziness or headache.  No other specific complaints in a complete review of systems (except as listed in HPI above).  Objective  Vitals:   02/28/16 1422  BP: 128/78  Pulse: 89  Resp: 18  Temp: 99.2 F (37.3 C)  SpO2: 98%  Weight: 204 lb 9 oz (92.8 kg)  Height: 5\' 5"  (1.651 m)    Body mass index is 34.04 kg/m.  Physical Exam  Constitutional: Patient appears well-developed and well-nourished. Obese No distress.  HEENT: head atraumatic,  normocephalic, pupils equal and reactive to light, neck supple, throat within normal limits Cardiovascular: Normal rate, regular rhythm and normal heart sounds.  No murmur heard. No BLE edema. Pulmonary/Chest: Effort normal and breath sounds normal. No respiratory distress. Abdominal: Soft.  There is no tenderness. Psychiatric: Patient has a normal mood and affect. behavior is normal. Judgment and thought content normal.  Recent Results (from the past 2160 hour(s))  Pap IG, CT/NG NAA, and HPV (high risk)     Status: None   Collection Time: 01/17/16  4:17 PM  Result Value Ref Range   HPV DNA High Risk Not Detected     Comment: HIGH RISK HPV types (16,18,31,33,35,39,45,51,52,56,58,59,66,68) were not detected. Other HPV types which cause anogenital lesions may be present. The significance of the other types of HPV in malignant  processes has not been established.                  ** Normal Reference Range: Not Detected **      HPV High Risk testing performed using the APTIMA HPV mRNA Assay.      Specimen adequacy:      Comment: SATISFACTORY.  Endocervical/transformation zone component present.   FINAL DIAGNOSIS:      Comment: - NEGATIVE FOR INTRAEPITHELIAL LESIONS OR MALIGNANCY.    COMMENTS:      Comment: This Pap test has been evaluated with computer assisted technology.   Cytotechnologist:      Comment: [place], CT(HEW)   Chlamydia Probe Amp NOT DETECTED     Comment:                    **Normal Reference Range: NOT DETECTED**   This test was performed using the APTIMA COMBO2 Assay ([employer]   The analytical performance characteristics of this assay, when used to test SurePath specimens have been determined by [facility]      GC Probe Amp NOT DETECTED     Comment:                    **Normal Reference Range: NOT DETECTED**   This test was performed using the APTIMA COMBO2 Assay ([employer]   The analytical performance characteristics of this assay, when  used to test [facility]Path specimens have been determined by 01/19/16   *  The Pap is a screening test for cervical cancer. It is not a  diagnostic test and is subject to false negative and false positive  results. It is most reliable when a satisfactory sample, regularly  obtained, is submitted with relevant clinical findings and history,  and when the Pap result is evaluated along with historic and current  clinical information.   Lipid panel     Status: Abnormal   Collection Time: 01/17/16  5:00 PM  Result Value Ref Range   Cholesterol 152 125 - 200 mg/dL   Triglycerides 76 01/19/16 mg/dL   HDL 45 (L) <979  mg/dL   Total CHOL/HDL Ratio 3.4 <=5.0 Ratio   VLDL 15 <30 mg/dL   LDL Cholesterol 92 <130 mg/dL    Comment:   Total Cholesterol/HDL Ratio:CHD Risk                        Coronary Heart Disease Risk Table                                        Men       Women          1/2 Average Risk              3.4        3.3              Average Risk              5.0        4.4           2X Average Risk              9.6        7.1           3X Average Risk             23.4       11.0 Use the calculated Patient Ratio above and the CHD Risk table  to determine the patient's CHD Risk.   Comprehensive metabolic panel     Status: None   Collection Time: 01/17/16  5:00 PM  Result Value Ref Range   Sodium 141 135 - 146 mmol/L   Potassium 4.4 3.5 - 5.3 mmol/L   Chloride 101 98 - 110 mmol/L   CO2 25 20 - 31 mmol/L   Glucose, Bld 84 65 - 99 mg/dL   BUN 12 7 - 25 mg/dL   Creat 0.79 0.50 - 1.10 mg/dL   Total Bilirubin 0.3 0.2 - 1.2 mg/dL   Alkaline Phosphatase 112 33 - 115 U/L   AST 14 10 - 35 U/L   ALT 11 6 - 29 U/L   Total Protein 7.9 6.1 - 8.1 g/dL   Albumin 3.9 3.6 - 5.1 g/dL   Calcium 8.7 8.6 - 10.2 mg/dL  CBC with Differential/Platelet     Status: Abnormal   Collection Time: 01/17/16  5:00 PM  Result Value Ref Range   WBC 6.7 3.8 - 10.8 K/uL   RBC 5.49 (H) 3.80 - 5.10 MIL/uL    Hemoglobin 14.1 11.7 - 15.5 g/dL   HCT 43.6 35.0 - 45.0 %   MCV 79.4 (L) 80.0 - 100.0 fL   MCH 25.7 (L) 27.0 - 33.0 pg   MCHC 32.3 32.0 - 36.0 g/dL   RDW 14.9 11.0 - 15.0 %   Platelets 327 140 - 400 K/uL   MPV 10.1 7.5 - 12.5 fL   Neutro Abs 4,958 1,500 - 7,800 cells/uL   Lymphs Abs 1,206 850 - 3,900 cells/uL   Monocytes Absolute 469 200 - 950 cells/uL   Eosinophils Absolute 67 15 - 500 cells/uL   Basophils Absolute 0 0 - 200 cells/uL   Neutrophils Relative % 74 %   Lymphocytes Relative 18 %   Monocytes Relative 7 %   Eosinophils Relative 1 %   Basophils Relative 0 %   Smear Review Criteria for review not met  Comment: ** Please note change in unit of measure and reference range(s). **  Hemoglobin A1c     Status: Abnormal   Collection Time: 01/17/16  5:00 PM  Result Value Ref Range   Hgb A1c MFr Bld 6.0 (H) <5.7 %    Comment:   For someone without known diabetes, a hemoglobin A1c value between 5.7% and 6.4% is consistent with prediabetes and should be confirmed with a follow-up test.   For someone with known diabetes, a value <7% indicates that their diabetes is well controlled. A1c targets should be individualized based on duration of diabetes, age, co-morbid conditions and other considerations.   This assay result is consistent with an increased risk of diabetes.   Currently, no consensus exists regarding use of hemoglobin A1c for diagnosis of diabetes in children.      Mean Plasma Glucose 126 mg/dL  VITAMIN D 25 Hydroxy (Vit-D Deficiency, Fractures)     Status: Abnormal   Collection Time: 01/17/16  5:00 PM  Result Value Ref Range   Vit D, 25-Hydroxy 11 (L) 30 - 100 ng/mL    Comment: Vitamin D Status           25-OH Vitamin D        Deficiency                <20 ng/mL        Insufficiency         20 - 29 ng/mL        Optimal             > or = 30 ng/mL   For 25-OH Vitamin D testing on patients on D2-supplementation and patients for whom quantitation of D2 and  D3 fractions is required, the [facility] 25-OH VIT D, (D2,D3), LC/MS/MS is recommended: order code 514 [id number] 1117 (patients > 2 yrs).   Vitamin B12     Status: None   Collection Time: 01/17/16  5:00 PM  Result Value Ref Range   Vitamin B-12 291 200 - 1,100 pg/mL  TSH     Status: None   Collection Time: 01/17/16  5:00 PM  Result Value Ref Range   TSH 1.49 mIU/L    Comment:   Reference Range   > or = 20 Years  0.40-4.50   Pregnancy Range First trimester  0.26-2.66 Second trimester 0.55-2.73 Third trimester  0.43-2.91     HIV antibody     Status: None   Collection Time: 01/17/16  5:00 PM  Result Value Ref Range   HIV 1&2 Ab, 4th Generation NONREACTIVE NONREACTIVE    Comment:   HIV-1 antigen and HIV-1/HIV-2 antibodies were not detected.  There is no laboratory evidence of HIV infection.   HIV-1/2 Antibody Diff        Not indicated. HIV-1 RNA, Qual TMA          Not indicated.     PLEASE NOTE: This information has been disclosed to you from records whose confidentiality may be protected by state law. If your state requires such protection, then the state law prohibits you from making any further disclosure of the information without the specific written consent of the person to whom it pertains, or as otherwise permitted by law. A general authorization for the release of medical or other information is NOT sufficient for this purpose.   The performance of this assay has not been clinically validated in patients less than 78 years old.   For additional information please refer to http://education.[facility].com/faq/FAQ106.  (  This link is being provided for informational/educational purposes only.)     Anaerobic and Aerobic Culture     Status: Abnormal   Collection Time: 02/06/16  4:47 PM  Result Value Ref Range   Anaerobic Culture Final report (A)    Result 1 Comment (A)     Comment: Bacteroides species, Bacteroides fragilis group Scant growth Studies at [facility] have  confirmed the observations of others who have demonstrated that Bacteroides fragilis group are routinely susceptible to Cefoxitin, Chloramphenicol, and Metronidazole and are usually resistant to Penicillin, and variably in resistance to Clindamycin.    Result 2 Comment (A)     Comment: Mixed anaerobic organisms, none predominating. Light growth    Aerobic Culture Final report (A)    Result 1 Comment (A)     Comment: Beta hemolytic Streptococcus, group B Scant growth Penicillin and ampicillin are drugs of choice for treatment of beta-hemolytic streptococcal infections. Susceptibility testing of penicillins and other beta-lactam agents approved by the FDA for treatment of beta-hemolytic streptococcal infections need not be performed routinely because nonsusceptible isolates are extremely rare in any beta-hemolytic streptococcus and have not been reported for Streptococcus pyogenes (group A). (CLSI 2011)    Result 2 Mixed skin flora     Comment: Heavy growth     PHQ2/9: Depression screen PHQ 2/9 01/17/2016  Decreased Interest 0  Down, Depressed, Hopeless 0  PHQ - 2 Score 0     Fall Risk: Fall Risk  01/17/2016  Falls in the past year? No     Assessment & Plan  1. Hypertension, benign  - lisinopril-hydrochlorothiazide (PRINZIDE,ZESTORETIC) 10-12.5 MG tablet; Take 1 tablet by mouth daily.  Dispense: 30 tablet; Refill: 5  2. Obesity (BMI 30.0-34.9)  Discussed with the patient the risk posed by an increased BMI. Discussed importance of portion control, calorie counting and at least 150 minutes of physical activity weekly. Avoid sweet beverages and drink more water. Eat at least 6 servings of fruit and vegetables daily   3. Vitamin D deficiency  Continue supplementation  4. Vitamin B12 deficiency  - cyanocobalamin ((VITAMIN B-12)) injection 1,000 mcg; Inject 1 mL (1,000 mcg total) into the muscle once.  5. Hidradenitis suppurativa  Taking antibiotics, sees Dr.  Jamal Collin  6. Left breast lump  - US BREAST LTD UNI RIGHT INC AXILLA; Future  7. Metabolic syndrome  Discussed life style modification   8. Low HDL (under 40)  Discussed change in diet

## 2016-03-06 ENCOUNTER — Telehealth: Payer: Self-pay | Admitting: *Deleted

## 2016-03-06 NOTE — Telephone Encounter (Signed)
Aware of culture report. appointment made for f/u.

## 2016-03-19 ENCOUNTER — Ambulatory Visit
Admission: RE | Admit: 2016-03-19 | Discharge: 2016-03-19 | Disposition: A | Discharge status: Home / Self Care | Payer: BC Managed Care – PPO | Source: Ambulatory Visit | Attending: Family Medicine | Admitting: Family Medicine

## 2016-03-19 ENCOUNTER — Other Ambulatory Visit: Payer: Self-pay | Admitting: Family Medicine

## 2016-03-19 DIAGNOSIS — N6320 Unspecified lump in the left breast, unspecified quadrant: Secondary | ICD-10-CM

## 2016-03-19 DIAGNOSIS — N63 Unspecified lump in breast: Secondary | ICD-10-CM | POA: Diagnosis not present

## 2016-03-29 ENCOUNTER — Ambulatory Visit: Payer: Self-pay | Admitting: General Surgery

## 2016-04-10 ENCOUNTER — Encounter: Payer: Self-pay | Admitting: Family Medicine

## 2016-04-10 ENCOUNTER — Ambulatory Visit (INDEPENDENT_AMBULATORY_CARE_PROVIDER_SITE_OTHER): Payer: BC Managed Care – PPO | Admitting: Family Medicine

## 2016-04-10 VITALS — BP 122/78 | HR 87 | Temp 98.2°F | Resp 16 | Ht 65.0 in | Wt 201.4 lb

## 2016-04-10 DIAGNOSIS — E538 Deficiency of other specified B group vitamins: Secondary | ICD-10-CM

## 2016-04-10 DIAGNOSIS — E669 Obesity, unspecified: Secondary | ICD-10-CM

## 2016-04-10 DIAGNOSIS — E559 Vitamin D deficiency, unspecified: Secondary | ICD-10-CM

## 2016-04-10 DIAGNOSIS — E8881 Metabolic syndrome: Secondary | ICD-10-CM | POA: Diagnosis not present

## 2016-04-10 DIAGNOSIS — F4321 Adjustment disorder with depressed mood: Secondary | ICD-10-CM

## 2016-04-10 DIAGNOSIS — I10 Essential (primary) hypertension: Secondary | ICD-10-CM

## 2016-04-10 DIAGNOSIS — Z23 Encounter for immunization: Secondary | ICD-10-CM | POA: Diagnosis not present

## 2016-04-10 DIAGNOSIS — G4700 Insomnia, unspecified: Secondary | ICD-10-CM | POA: Diagnosis not present

## 2016-04-10 MED ORDER — TRAZODONE HCL 50 MG PO TABS: 25.0000 mg | ORAL_TABLET | Freq: Every evening | ORAL | 0 refills | Status: DC | PRN

## 2016-04-10 MED ORDER — VITAMIN D (ERGOCALCIFEROL) 1.25 MG (50000 UNIT) PO CAPS: 50000.0000 [IU] | ORAL_CAPSULE | ORAL | 0 refills | Status: DC

## 2016-04-10 MED ORDER — CYANOCOBALAMIN 1000 MCG/ML IJ SOLN
1000.0000 ug | Freq: Once | INTRAMUSCULAR | Status: AC
  Administered 2016-04-10: 1000 ug via INTRAMUSCULAR

## 2016-04-10 MED ORDER — B-12 1000 MCG SL SUBL: 1.0000 | SUBLINGUAL_TABLET | Freq: Every day | SUBLINGUAL | 2 refills | Status: DC

## 2016-04-10 NOTE — Progress Notes (Signed)
Name: Sara Foley   MRN: [id number]    DOB: 05/04/1966   Date:04/10/2016       Progress Note  Subjective  Chief Complaint  Chief Complaint  Patient presents with  . Insomnia    father passed and has been having trouble sleeping    HPI  HTN: she has been compliant with medication, she denies chest pain or palpitation. BP is at goal today  Metabolic Syndrome: last hgbA1C was 6 in June, discussed importance of a diabetic diet, she has family history of DM but she denies polyphagia, polyuria or polydipsia  Vitamin D deficiency: she has been unable to get rx filled at pharmacy, but willing to take it  B12 deficiency: she is feeling very tired, advised to get B12 injection today.  Grieving/ Insomnia: she states her father was diagnosed with leukemia years ago, but worse since April and died August 29th. She states she is not crying, but has difficulty staying asleep. She is tired all day because of lack of sleep. She has never taken medication for sleep in the past.   Obesity: lost a few pounds since last visit, discussed importance of changing her diet and exercising on a regular basis  Patient Active Problem List   Diagnosis Date Noted  . Metabolic syndrome 02/28/2016  . Low HDL (under 40) 02/28/2016  . History of hysterectomy leaving cervix intact 01/19/2016  . Vitamin D deficiency 01/18/2016  . Vitamin B12 deficiency 01/18/2016  . Obesity (BMI 30.0-34.9) 01/17/2016  . History of gestational diabetes 01/17/2016  . Hypertension, benign 01/17/2016  . Hidradenitis suppurativa 01/17/2016  . History of iron deficiency anemia 01/17/2016  . Neck pain 01/17/2016    Past Surgical History:  Procedure Laterality Date  . ABDOMINAL HYSTERECTOMY  2012   partial  . BREAST BIOPSY Left 04-14-12   left breast stereotactic biopsy; fibroadenoma  . BREAST REDUCTION SURGERY  1999  . PARTIAL HYSTERECTOMY  July 2013   due to fibroid tumors  . REDUCTION MAMMAPLASTY Bilateral 2000    Family  History  Problem Relation Age of Onset  . Prostate cancer Other   . Colon cancer Maternal Uncle   . Diabetes Mother   . Arthritis/Rheumatoid Mother   . Leukemia Father     Social History   Social History  . Marital status: Divorced    Spouse name: N/A  . Number of children: N/A  . Years of education: N/A   Occupational History  . Not on file.   Social History Main Topics  . Smoking status: Never Smoker  . Smokeless tobacco: Never Used  . Alcohol use 0.5 oz/week    1 Standard drinks or equivalent per week  . Drug use: No  . Sexual activity: Not Currently   Other Topics Concern  . Not on file   Social History Narrative  . No narrative on file     Current Outpatient Prescriptions:  .  lisinopril-hydrochlorothiazide (PRINZIDE,ZESTORETIC) 10-12.5 MG tablet, Take 1 tablet by mouth daily., Disp: 30 tablet, Rfl: 5 .  Cyanocobalamin (B-12) 1000 MCG SUBL, Place 1 tablet under the tongue daily., Disp: 30 each, Rfl: 2 .  Vitamin D, Ergocalciferol, (DRISDOL) 50000 units CAPS capsule, Take 1 capsule (50,000 Units total) by mouth every 7 (seven) days., Disp: 12 capsule, Rfl: 0  Current Facility-Administered Medications:  .  cyanocobalamin ((VITAMIN B-12)) injection 1,000 mcg, 1,000 mcg, Intramuscular, Once, Krichna Sowles, MD  No Known Allergies   ROS  Constitutional: Negative for fever , positive for    weight change.  Respiratory: Negative for cough and shortness of breath.   Cardiovascular: Negative for chest pain or palpitations.  Gastrointestinal: Negative for abdominal pain, no bowel changes.  Musculoskeletal: Negative for gait problem or joint swelling.  Skin: Negative for rash.  Neurological: Negative for dizziness or headache.  No other specific complaints in a complete review of systems (except as listed in HPI above).  Objective  Vitals:   04/10/16 0910  BP: 122/78  Pulse: 87  Resp: 16  Temp: 98.2 F (36.8 C)  SpO2: 97%  Weight: 201 lb 7 oz (91.4 kg)   Height: 5' 5" (1.651 m)    Body mass index is 33.52 kg/m.  Physical Exam  Constitutional: Patient appears well-developed and well-nourished. Obese  No distress.  HEENT: head atraumatic, normocephalic, pupils equal and reactive to light,neck supple, throat within normal limits Cardiovascular: Normal rate, regular rhythm and normal heart sounds.  No murmur heard. No BLE edema. Pulmonary/Chest: Effort normal and breath sounds normal. No respiratory distress. Abdominal: Soft.  There is no tenderness. Psychiatric: Patient has a normal mood and affect. behavior is normal. Judgment and thought content normal.  Recent Results (from the past 2160 hour(s))  Pap IG, CT/NG NAA, and HPV (high risk)     Status: None   Collection Time: 01/17/16  4:17 PM  Result Value Ref Range   HPV DNA High Risk Not Detected     Comment: HIGH RISK HPV types (16,18,31,33,35,39,45,51,52,56,58,59,66,68) were not detected. Other HPV types which cause anogenital lesions may be present. The significance of the other types of HPV in malignant  processes has not been established.                  ** Normal Reference Range: Not Detected **      HPV High Risk testing performed using the APTIMA HPV mRNA Assay.      Specimen adequacy:      Comment: SATISFACTORY.  Endocervical/transformation zone component present.   FINAL DIAGNOSIS:      Comment: - NEGATIVE FOR INTRAEPITHELIAL LESIONS OR MALIGNANCY.    COMMENTS:      Comment: This Pap test has been evaluated with computer assisted technology.   Cytotechnologist:      Comment: [place], CT(HEW)   Chlamydia Probe Amp NOT DETECTED     Comment:                    **Normal Reference Range: NOT DETECTED**   This test was performed using the APTIMA COMBO2 Assay ([employer]   The analytical performance characteristics of this assay, when used to test SurePath specimens have been determined by [facility]      GC Probe Amp NOT DETECTED     Comment:                     **Normal Reference Range: NOT DETECTED**   This test was performed using the APTIMA COMBO2 Assay ([employer]   The analytical performance characteristics of this assay, when used to test SurePath specimens have been determined by [facility]   *  The Pap is a screening test for cervical cancer. It is not a  diagnostic test and is subject to false negative and false positive  results. It is most reliable when a satisfactory sample, regularly  obtained, is submitted with relevant clinical findings and history,  and when the Pap result is evaluated along with historic and current  clinical information.   Lipid panel       Status: Abnormal   Collection Time: 01/17/16  5:00 PM  Result Value Ref Range   Cholesterol 152 125 - 200 mg/dL   Triglycerides 76 <150 mg/dL   HDL 45 (L) >=46 mg/dL   Total CHOL/HDL Ratio 3.4 <=5.0 Ratio   VLDL 15 <30 mg/dL   LDL Cholesterol 92 <130 mg/dL    Comment:   Total Cholesterol/HDL Ratio:CHD Risk                        Coronary Heart Disease Risk Table                                        Men       Women          1/2 Average Risk              3.4        3.3              Average Risk              5.0        4.4           2X Average Risk              9.6        7.1           3X Average Risk             23.4       11.0 Use the calculated Patient Ratio above and the CHD Risk table  to determine the patient's CHD Risk.   Comprehensive metabolic panel     Status: None   Collection Time: 01/17/16  5:00 PM  Result Value Ref Range   Sodium 141 135 - 146 mmol/L   Potassium 4.4 3.5 - 5.3 mmol/L   Chloride 101 98 - 110 mmol/L   CO2 25 20 - 31 mmol/L   Glucose, Bld 84 65 - 99 mg/dL   BUN 12 7 - 25 mg/dL   Creat 0.79 0.50 - 1.10 mg/dL   Total Bilirubin 0.3 0.2 - 1.2 mg/dL   Alkaline Phosphatase 112 33 - 115 U/L   AST 14 10 - 35 U/L   ALT 11 6 - 29 U/L   Total Protein 7.9 6.1 - 8.1 g/dL   Albumin 3.9 3.6 - 5.1 g/dL   Calcium 8.7 8.6  - 10.2 mg/dL  CBC with Differential/Platelet     Status: Abnormal   Collection Time: 01/17/16  5:00 PM  Result Value Ref Range   WBC 6.7 3.8 - 10.8 K/uL   RBC 5.49 (H) 3.80 - 5.10 MIL/uL   Hemoglobin 14.1 11.7 - 15.5 g/dL   HCT 43.6 35.0 - 45.0 %   MCV 79.4 (L) 80.0 - 100.0 fL   MCH 25.7 (L) 27.0 - 33.0 pg   MCHC 32.3 32.0 - 36.0 g/dL   RDW 14.9 11.0 - 15.0 %   Platelets 327 140 - 400 K/uL   MPV 10.1 7.5 - 12.5 fL   Neutro Abs 4,958 1,500 - 7,800 cells/uL   Lymphs Abs 1,206 850 - 3,900 cells/uL   Monocytes Absolute 469 200 - 950 cells/uL   Eosinophils Absolute 67 15 - 500 cells/uL   Basophils Absolute 0 0 - 200 cells/uL   Neutrophils Relative % 74 %  Lymphocytes Relative 18 %   Monocytes Relative 7 %   Eosinophils Relative 1 %   Basophils Relative 0 %   Smear Review Criteria for review not met     Comment: ** Please note change in unit of measure and reference range(s). **  Hemoglobin A1c     Status: Abnormal   Collection Time: 01/17/16  5:00 PM  Result Value Ref Range   Hgb A1c MFr Bld 6.0 (H) <5.7 %    Comment:   For someone without known diabetes, a hemoglobin A1c value between 5.7% and 6.4% is consistent with prediabetes and should be confirmed with a follow-up test.   For someone with known diabetes, a value <7% indicates that their diabetes is well controlled. A1c targets should be individualized based on duration of diabetes, age, co-morbid conditions and other considerations.   This assay result is consistent with an increased risk of diabetes.   Currently, no consensus exists regarding use of hemoglobin A1c for diagnosis of diabetes in children.      Mean Plasma Glucose 126 mg/dL  VITAMIN D 25 Hydroxy (Vit-D Deficiency, Fractures)     Status: Abnormal   Collection Time: 01/17/16  5:00 PM  Result Value Ref Range   Vit D, 25-Hydroxy 11 (L) 30 - 100 ng/mL    Comment: Vitamin D Status           25-OH Vitamin D        Deficiency                <20 ng/mL         Insufficiency         20 - 29 ng/mL        Optimal             > or = 30 ng/mL   For 25-OH Vitamin D testing on patients on D2-supplementation and patients for whom quantitation of D2 and D3 fractions is required, the [facility] 25-OH VIT D, (D2,D3), LC/MS/MS is recommended: order code 85814 (patients > 2 yrs).   Vitamin B12     Status: None   Collection Time: 01/17/16  5:00 PM  Result Value Ref Range   Vitamin B-12 291 200 - 1,100 pg/mL  TSH     Status: None   Collection Time: 01/17/16  5:00 PM  Result Value Ref Range   TSH 1.49 mIU/L    Comment:   Reference Range   > or = 20 Years  0.40-4.50   Pregnancy Range First trimester  0.26-2.66 Second trimester 0.55-2.73 Third trimester  0.43-2.91     HIV antibody     Status: None   Collection Time: 01/17/16  5:00 PM  Result Value Ref Range   HIV 1&2 Ab, 4th Generation NONREACTIVE NONREACTIVE    Comment:   HIV-1 antigen and HIV-1/HIV-2 antibodies were not detected.  There is no laboratory evidence of HIV infection.   HIV-1/2 Antibody Diff        Not indicated. HIV-1 RNA, Qual TMA          Not indicated.     PLEASE NOTE: This information has been disclosed to you from records whose confidentiality may be protected by state law. If your state requires such protection, then the state law prohibits you from making any further disclosure of the information without the specific written consent of the person to whom it pertains, or as otherwise permitted by law. A general authorization for the release of medical or other information   is NOT sufficient for this purpose.   The performance of this assay has not been clinically validated in patients less than 75 years old.   For additional information please refer to http://education.[facility].com/faq/FAQ106.  (This link is being provided for informational/educational purposes only.)     Anaerobic and Aerobic Culture     Status: Abnormal   Collection Time: 02/06/16  4:47  PM  Result Value Ref Range   Anaerobic Culture Final report (A)    Result 1 Comment (A)     Comment: Bacteroides species, Bacteroides fragilis group Scant growth Studies at [facility] have confirmed the observations of others who have demonstrated that Bacteroides fragilis group are routinely susceptible to Cefoxitin, Chloramphenicol, and Metronidazole and are usually resistant to Penicillin, and variably in resistance to Clindamycin.    Result 2 Comment (A)     Comment: Mixed anaerobic organisms, none predominating. Light growth    Aerobic Culture Final report (A)    Result 1 Comment (A)     Comment: Beta hemolytic Streptococcus, group B Scant growth Penicillin and ampicillin are drugs of choice for treatment of beta-hemolytic streptococcal infections. Susceptibility testing of penicillins and other beta-lactam agents approved by the FDA for treatment of beta-hemolytic streptococcal infections need not be performed routinely because nonsusceptible isolates are extremely rare in any beta-hemolytic streptococcus and have not been reported for Streptococcus pyogenes (group A). (CLSI 2011)    Result 2 Mixed skin flora     Comment: Heavy growth     PHQ2/9: Depression screen [facility] 2/9 04/10/2016 01/17/2016  Decreased Interest 0 0  Down, Depressed, Hopeless 0 0  PHQ - 2 Score 0 0     Fall Risk: Fall Risk  04/10/2016 01/17/2016  Falls in the past year? No No      Functional Status Survey: Is the patient deaf or have difficulty hearing?: No Does the patient have difficulty seeing, even when wearing glasses/contacts?: Yes Does the patient have difficulty concentrating, remembering, or making decisions?: No Does the patient have difficulty walking or climbing stairs?: No Does the patient have difficulty dressing or bathing?: No Does the patient have difficulty doing errands alone such as visiting a doctor's office or shopping?: No    Assessment & Plan  1. Hypertension,  benign  Well controlled  2. Metabolic syndrome  Discussed life style modification   3. Obesity (BMI 30.0-34.9)  Discussed with the patient the risk posed by an increased BMI. Discussed importance of portion control, calorie counting and at least 150 minutes of physical activity weekly. Avoid sweet beverages and drink more water. Eat at least 6 servings of fruit and vegetables daily   4. Vitamin D deficiency  - Vitamin D, Ergocalciferol, (DRISDOL) 50000 units CAPS capsule; Take 1 capsule (50,000 Units total) by mouth every 7 (seven) days.  Dispense: 12 capsule; Refill: 0  5. Vitamin B12 deficiency  - cyanocobalamin ((VITAMIN B-12)) injection 1,000 mcg; Inject 1 mL (1,000 mcg total) into the muscle once. - Cyanocobalamin (B-12) 1000 MCG SUBL; Place 1 tablet under the tongue daily.  Dispense: 30 each; Refill: 2  6. Needs flu shot  - Flu Vaccine QUAD 36+ mos IM -refused  7. Grieving  Discussed hospice grieving therapy  8. Insomnia  - traZODone (DESYREL) 50 MG tablet; Take 0.5-1.5 tablets (25-75 mg total) by mouth at bedtime as needed for sleep.  Dispense: 45 tablet; Refill: 0

## 2016-06-08 ENCOUNTER — Other Ambulatory Visit: Payer: Self-pay | Admitting: Family Medicine

## 2016-06-08 NOTE — Telephone Encounter (Signed)
Patient requesting a refill on the following medication that she is out of:  Lisinopril 10-12.5 mg  [facility] [address]

## 2016-06-09 ENCOUNTER — Other Ambulatory Visit: Payer: Self-pay | Admitting: Family Medicine

## 2016-06-09 NOTE — Telephone Encounter (Signed)
It was sent to [facility] back in 08 with 5 refills, please contact pharmacy to verify and if not there call it in. Thank you

## 2016-06-11 NOTE — Telephone Encounter (Signed)
Patient was informed that she should have at least 2 if not 1 refill left. She stated that she called the automated system and it said she had none. I then encouraged her to call and speak with a person and if a refill is needed to have them fax us a request. She said ok and thanks.

## 2016-08-30 ENCOUNTER — Other Ambulatory Visit: Payer: Self-pay | Admitting: Family Medicine

## 2016-08-30 MED ORDER — OSELTAMIVIR PHOSPHATE 75 MG PO CAPS
75.0000 mg | ORAL_CAPSULE | Freq: Every day | ORAL | 0 refills | Status: DC
Start: 2016-08-30 — End: 2016-09-19

## 2016-08-31 ENCOUNTER — Ambulatory Visit: Payer: BC Managed Care – PPO | Admitting: Family Medicine

## 2016-09-19 ENCOUNTER — Ambulatory Visit (INDEPENDENT_AMBULATORY_CARE_PROVIDER_SITE_OTHER): Payer: BC Managed Care – PPO | Admitting: Family Medicine

## 2016-09-19 ENCOUNTER — Encounter: Payer: Self-pay | Admitting: Family Medicine

## 2016-09-19 VITALS — BP 122/88 | HR 96 | Temp 98.8°F | Resp 16 | Ht 65.0 in | Wt 208.2 lb

## 2016-09-19 DIAGNOSIS — G4709 Other insomnia: Secondary | ICD-10-CM | POA: Diagnosis not present

## 2016-09-19 DIAGNOSIS — E8881 Metabolic syndrome: Secondary | ICD-10-CM

## 2016-09-19 DIAGNOSIS — E559 Vitamin D deficiency, unspecified: Secondary | ICD-10-CM

## 2016-09-19 DIAGNOSIS — R7303 Prediabetes: Secondary | ICD-10-CM | POA: Diagnosis not present

## 2016-09-19 DIAGNOSIS — E786 Lipoprotein deficiency: Secondary | ICD-10-CM

## 2016-09-19 DIAGNOSIS — I10 Essential (primary) hypertension: Secondary | ICD-10-CM

## 2016-09-19 DIAGNOSIS — E538 Deficiency of other specified B group vitamins: Secondary | ICD-10-CM

## 2016-09-19 DIAGNOSIS — E669 Obesity, unspecified: Secondary | ICD-10-CM | POA: Diagnosis not present

## 2016-09-19 DIAGNOSIS — E66811 Obesity, class 1: Secondary | ICD-10-CM

## 2016-09-19 MED ORDER — VALSARTAN 80 MG PO TABS: 80.0000 mg | ORAL_TABLET | Freq: Every day | ORAL | 1 refills | Status: DC

## 2016-09-19 MED ORDER — DULAGLUTIDE 1.5 MG/0.5ML ~~LOC~~ SOAJ: 1.5000 mg | SUBCUTANEOUS | 2 refills | Status: DC

## 2016-09-19 MED ORDER — B-12 1000 MCG SL SUBL: 1.0000 | SUBLINGUAL_TABLET | Freq: Every day | SUBLINGUAL | 1 refills | Status: DC

## 2016-09-19 MED ORDER — VITAMIN D3 50 MCG (2000 UT) PO CAPS: 2000.0000 [IU] | ORAL_CAPSULE | Freq: Every day | ORAL | 1 refills | Status: DC

## 2016-09-19 MED ORDER — VITAMIN D3 50 MCG (2000 UT) PO CAPS: 2000.0000 [IU] | ORAL_CAPSULE | Freq: Every day | ORAL | 0 refills | Status: DC

## 2016-09-19 NOTE — Addendum Note (Signed)
Addended by: Cynda FamiliaJOHNSON, Mailey Landstrom L on: 09/19/2016 02:10 PM   Modules accepted: Orders

## 2016-09-19 NOTE — Progress Notes (Signed)
Name: Sara Foley   MRN: [id number]    DOB: Apr 19, 1966   Date:09/19/2016       Progress Note  Subjective  Chief Complaint  Chief Complaint  Patient presents with  . Hypertension    6 month follow up pt has not been taking medication but has been watching what she eats  . Insomnia    HPI  HTN: she has not been compliant with medication, she denies chest pain or palpitation. BP is at goal today, but DBP is 10 points higher, she is willing to resume medication but is afraid of ACE inhibitor and does not like HCTZ.  Metabolic Syndrome: last hgbA1C was 6 in June, discussed importance of a diabetic diet, she has family history of DM and also previous history of gestational diabetes, but she denies polyphagia, polyuria or polydipsia  Vitamin D deficiency: she has not been taking Vitamin D supplementation   B12 deficiency: she is feeling very tired, advised to get B12 injection today but she wants to hold off for now, she will resume otc supplementation.   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 Discussed sleep hygiene  Obesity: she has gained weight, she states she has been gaining weight because she has been more stressed, discussed importance of changing her diet and exercising on a regular basis, she is willing to try GLP-1   Patient Active Problem List   Diagnosis Date Noted  . Metabolic syndrome 02/28/2016  . Low HDL (under 40) 02/28/2016  . History of hysterectomy leaving cervix intact 01/19/2016  . Vitamin D deficiency 01/18/2016  . Vitamin B12 deficiency 01/18/2016  . Obesity (BMI 30.0-34.9) 01/17/2016  . History of gestational diabetes 01/17/2016  . Hypertension, benign 01/17/2016  . Hidradenitis suppurativa 01/17/2016  . History of iron deficiency anemia 01/17/2016  . Neck pain 01/17/2016    Past Surgical History:  Procedure Laterality Date  . ABDOMINAL HYSTERECTOMY  2012   partial  .  BREAST BIOPSY Left 04-14-12   left breast stereotactic biopsy; fibroadenoma  . BREAST REDUCTION SURGERY  1999  . PARTIAL HYSTERECTOMY  July 2013   due to fibroid tumors  . REDUCTION MAMMAPLASTY Bilateral 2000    Family History  Problem Relation Age of Onset  . Prostate cancer Other   . Colon cancer Maternal Uncle   . Diabetes Mother   . Arthritis/Rheumatoid Mother   . Leukemia Father     Social History   Social History  . Marital status: Divorced    Spouse name: N/A  . Number of children: N/A  . Years of education: N/A   Occupational History  . Not on file.   Social History Main Topics  . Smoking status: Never Smoker  . Smokeless tobacco: Never Used  . Alcohol use 0.5 oz/week    1 Standard drinks or equivalent per week  . Drug use: No  . Sexual activity: Not Currently   Other Topics Concern  . Not on file   Social History Narrative  . No narrative on file     Current Outpatient Prescriptions:  .  Cyanocobalamin (B-12) 1000 MCG SUBL, Place 1 tablet under the tongue daily., Disp: 30 each, Rfl: 2 .  Cholecalciferol (VITAMIN D3) 2000 units capsule, Take 1 capsule (2,000 Units total) by mouth daily., Disp: 30 capsule, Rfl: 0 .  valsartan (DIOVAN) 80 MG tablet, Take 1 tablet (80 mg total) by mouth daily., Disp: 90 tablet, Rfl: 1  No Known Allergies   ROS  Constitutional: Negative for fever, positive for  weight change.  Respiratory: Negative for cough and shortness of breath.   Cardiovascular: Negative for chest pain or palpitations.  Gastrointestinal: Negative for abdominal pain, no bowel changes.  Musculoskeletal: Negative for gait problem or joint swelling.  Skin: Negative for rash.  Neurological: Negative for dizziness or headache.  No other specific complaints in a complete review of systems (except as listed in HPI above).  Objective  Vitals:   09/19/16 1328  BP: 122/88  Pulse: 96  Resp: 16  Temp: 98.8 F (37.1 C)  SpO2: 96%  Weight: 208 lb 4 oz  (94.5 kg)  Height: 5\' 5"  (1.651 m)    Body mass index is 34.65 kg/m.  Physical Exam  Constitutional: Patient appears well-developed and well-nourished. Obese  No distress.  HEENT: head atraumatic, normocephalic, pupils equal and reactive to light,neck supple, throat within normal limits Cardiovascular: Normal rate, regular rhythm and normal heart sounds.  No murmur heard. No BLE edema. Pulmonary/Chest: Effort normal and breath sounds normal. No respiratory distress. Abdominal: Soft.  There is no tenderness. Psychiatric: Patient has a normal mood and affect. behavior is normal. Judgment and thought content normal.  PHQ2/9: Depression screen [facility] 2/9 09/19/2016 04/10/2016 01/17/2016  Decreased Interest 0 0 0  Down, Depressed, Hopeless 0 0 0  PHQ - 2 Score 0 0 0    Fall Risk: Fall Risk  09/19/2016 04/10/2016 01/17/2016  Falls in the past year? No No No    Functional Status Survey: Is the patient deaf or have difficulty hearing?: No Does the patient have difficulty seeing, even when wearing glasses/contacts?: No Does the patient have difficulty concentrating, remembering, or making decisions?: No Does the patient have difficulty walking or climbing stairs?: No Does the patient have difficulty dressing or bathing?: No Does the patient have difficulty doing errands alone such as visiting a doctor's office or shopping?: No   Assessment & Plan  1. Hypertension, benign  - valsartan (DIOVAN) 80 MG tablet; Take 1 tablet (80 mg total) by mouth daily.  Dispense: 90 tablet; Refill: 1  2. Metabolic syndrome  Discussed starting GLP-1 agonist, she denies family history of thyroid cancer or MEN, discussed possible side effects - Dulaglutide (TRULICITY) 1.5 MG/0.5ML SOPN; Inject 1.5 mg into the skin once a week.  Dispense: 4 pen; Refill: 2  3. Obesity (BMI 30.0-34.9)  Discussed with the patient the risk posed by an increased BMI. Discussed importance of portion control, calorie counting and  at least 150 minutes of physical activity weekly. Avoid sweet beverages and drink more water. Eat at least 6 servings of fruit and vegetables daily  - Dulaglutide (TRULICITY) 1.5 MG/0.5ML SOPN; Inject 1.5 mg into the skin once a week.  Dispense: 4 pen; Refill: 2  4. Vitamin B12 deficiency  She needs to resume medication, offered injection but she would like to hold off for now  5. Other insomnia  She did not take Trazodone, she states she is trying to have better   6. Vitamin D deficiency  - Cholecalciferol (VITAMIN D3) 2000 units capsule; Take 1 capsule (2,000 Units total) by mouth daily.  Dispense: 30 capsule; Refill: 0  7. Low HDL (under 40)  Discussed importance of 150 minutes of physical activity weekly, eat two servings of fish weekly, eat one serving of tree nuts ( cashews, pistachios, pecans, almonds.Marland Kitchen) every other day, eat 6 servings of fruit/vegetables daily and drink plenty of water and avoid sweet beverages.  8. Pre-diabetes  - Dulaglutide (TRULICITY) 1.5 MG/0.5ML SOPN; Inject 1.5 mg into the skin once a week.  Dispense: 4 pen; Refill: 2

## 2016-12-26 ENCOUNTER — Encounter: Payer: Self-pay | Admitting: *Deleted

## 2017-01-07 ENCOUNTER — Encounter: Payer: Self-pay | Admitting: Family Medicine

## 2017-01-07 ENCOUNTER — Ambulatory Visit (INDEPENDENT_AMBULATORY_CARE_PROVIDER_SITE_OTHER): Payer: BC Managed Care – PPO | Admitting: Family Medicine

## 2017-01-07 VITALS — BP 148/96 | HR 110 | Temp 98.1°F | Resp 18 | Ht 65.0 in | Wt 207.2 lb

## 2017-01-07 DIAGNOSIS — E559 Vitamin D deficiency, unspecified: Secondary | ICD-10-CM | POA: Diagnosis not present

## 2017-01-07 DIAGNOSIS — E8881 Metabolic syndrome: Secondary | ICD-10-CM

## 2017-01-07 DIAGNOSIS — E538 Deficiency of other specified B group vitamins: Secondary | ICD-10-CM | POA: Diagnosis not present

## 2017-01-07 DIAGNOSIS — E669 Obesity, unspecified: Secondary | ICD-10-CM | POA: Diagnosis not present

## 2017-01-07 DIAGNOSIS — I10 Essential (primary) hypertension: Secondary | ICD-10-CM | POA: Diagnosis not present

## 2017-01-07 DIAGNOSIS — J40 Bronchitis, not specified as acute or chronic: Secondary | ICD-10-CM | POA: Diagnosis not present

## 2017-01-07 LAB — CBC WITH DIFFERENTIAL/PLATELET
BASOS ABS: 0 {cells}/uL (ref 0–200)
Basophils Relative: 0 %
EOS PCT: 2 %
Eosinophils Absolute: 176 cells/uL (ref 15–500)
HEMATOCRIT: 40.4 % (ref 35.0–45.0)
HEMOGLOBIN: 13.3 g/dL (ref 11.7–15.5)
LYMPHS ABS: 1320 {cells}/uL (ref 850–3900)
Lymphocytes Relative: 15 %
MCH: 26.3 pg — AB (ref 27.0–33.0)
MCHC: 32.9 g/dL (ref 32.0–36.0)
MCV: 80 fL (ref 80.0–100.0)
MONO ABS: 792 {cells}/uL (ref 200–950)
MPV: 9.7 fL (ref 7.5–12.5)
Monocytes Relative: 9 %
NEUTROS ABS: 6512 {cells}/uL (ref 1500–7800)
Neutrophils Relative %: 74 %
Platelets: 284 10*3/uL (ref 140–400)
RBC: 5.05 MIL/uL (ref 3.80–5.10)
RDW: 14.7 % (ref 11.0–15.0)
WBC: 8.8 10*3/uL (ref 3.8–10.8)

## 2017-01-07 MED ORDER — DM-GUAIFENESIN ER 30-600 MG PO TB12: 1.0000 | ORAL_TABLET | Freq: Two times a day (BID) | ORAL | 0 refills | Status: DC

## 2017-01-07 MED ORDER — BENZONATATE 100 MG PO CAPS: 100.0000 mg | ORAL_CAPSULE | Freq: Three times a day (TID) | ORAL | 0 refills | Status: DC | PRN

## 2017-01-07 NOTE — Progress Notes (Signed)
Name: Sara Foley   MRN: [id number]    DOB: Oct 15, 1965   Date:01/07/2017       Progress Note  Subjective  Chief Complaint  Chief Complaint  Patient presents with  . Hypertension    Denies any symptoms  . Cough    Onset-4 days, productive cough white mucus and dry at times, losing her voice. Has tried otc Cater[employer] managerAlka Seltzer Plus  . Medication Refill    HPI  HTN: she has not been compliant with medication, she denies chest pain or palpitation. BP is hight today. Explained importance of compliance.  Metabolic Syndrome: last hgbA1C was 6 in June, discussed importance of a diabetic diet, she has family history of DM and also previous history of gestational diabetes, but she denies polyphagia, polyuria or polydipsia. She was given Trulicity back in Feb 2018 but she stopped on her own.   Vitamin D deficiency: she has been taking Vitamin D supplementation   B12 deficiency: she is feeling very tired, advised to get B12 injection today but she wants to hold off for now, she will resume otc supplementation.   Obesity:  she states she has been gaining weight because she has been more stressed, discussed importance of changing her diet and exercising on a regular basis, she stopped GLP-1 agonist   Patient Active Problem List   Diagnosis Date Noted  . Metabolic syndrome 02/28/2016  . Low HDL (under 40) 02/28/2016  . History of hysterectomy leaving cervix intact 01/19/2016  . Vitamin D deficiency 01/18/2016  . Vitamin B12 deficiency 01/18/2016  . Obesity (BMI 30.0-34.9) 01/17/2016  . History of gestational diabetes 01/17/2016  . Hypertension, benign 01/17/2016  . Hidradenitis suppurativa 01/17/2016  . History of iron deficiency anemia 01/17/2016  . Neck pain 01/17/2016    Past Surgical History:  Procedure Laterality Date  . ABDOMINAL HYSTERECTOMY  2012   partial  . BREAST BIOPSY Left 04-14-12   left breast stereotactic biopsy; fibroadenoma  . BREAST REDUCTION SURGERY  1999  . PARTIAL  HYSTERECTOMY  July 2013   due to fibroid tumors  . REDUCTION MAMMAPLASTY Bilateral 2000    Family History  Problem Relation Age of Onset  . Prostate cancer Other   . Colon cancer Maternal Uncle   . Diabetes Mother   . Arthritis/Rheumatoid Mother   . Leukemia Father     Social History   Social History  . Marital status: Divorced    Spouse name: N/A  . Number of children: N/A  . Years of education: N/A   Occupational History  . Not on file.   Social History Main Topics  . Smoking status: Never Smoker  . Smokeless tobacco: Never Used  . Alcohol use 0.5 oz/week    1 Standard drinks or equivalent per week  . Drug use: No  . Sexual activity: Not Currently   Other Topics Concern  . Not on file   Social History Narrative  . No narrative on file     Current Outpatient Prescriptions:  .  valsartan (DIOVAN) 80 MG tablet, Take 1 tablet (80 mg total) by mouth daily., Disp: 90 tablet, Rfl: 1 .  benzonatate (TESSALON) 100 MG capsule, Take 1-2 capsules (100-200 mg total) by mouth 3 (three) times daily as needed., Disp: 40 capsule, Rfl: 0 .  Cholecalciferol (VITAMIN D3) 2000 units capsule, Take 1 capsule (2,000 Units total) by mouth daily., Disp: 90 capsule, Rfl: 1 .  Cyanocobalamin (B-12) 1000 MCG SUBL, Place 1 tablet under the tongue daily. (Patient  not taking: Reported on 01/07/2017), Disp: 90 each, Rfl: 1 .  dextromethorphan-guaiFENesin (MUCINEX DM) 30-600 MG 12hr tablet, Take 1 tablet by mouth 2 (two) times daily., Disp: 30 tablet, Rfl: 0  No Known Allergies   ROS  Constitutional: Negative for fever or weight change.  Respiratory: Positive for cough but no  shortness of breath.   Cardiovascular: Negative for chest pain or palpitations.  Gastrointestinal: Negative for abdominal pain, no bowel changes.  Musculoskeletal: Negative for gait problem or joint swelling.  Skin: Negative for rash.  Neurological: Negative for dizziness , she has intermittent headache.  No other  specific complaints in a complete review of systems (except as listed in HPI above).  Objective  Vitals:   01/07/17 0842  BP: (!) 148/96  Pulse: (!) 110  Resp: 18  Temp: 98.1 F (36.7 C)  TempSrc: Oral  SpO2: 98%  Weight: 207 lb 3.2 oz (94 kg)  Height: 5\' 5"  (1.651 m)    Body mass index is 34.48 kg/m.  Physical Exam  Constitutional: Patient appears well-developed and well-nourished. Obese  No distress.  HEENT: head atraumatic, normocephalic, pupils equal and reactive to light, ears normal TM, neck supple, throat within normal limits Cardiovascular: high rate, regular rhythm and normal heart sounds.  No murmur heard. No BLE edema. Pulmonary/Chest: Effort normal and breath sounds normal. No respiratory distress. Abdominal: Soft.  There is no tenderness. Psychiatric: Patient has a normal mood and affect. behavior is normal. Judgment and thought content normal.  PHQ2/9: Depression screen [facility] 2/9 01/07/2017 09/19/2016 04/10/2016 01/17/2016  Decreased Interest 0 0 0 0  Down, Depressed, Hopeless 0 0 0 0  PHQ - 2 Score 0 0 0 0    Fall Risk: Fall Risk  01/07/2017 09/19/2016 04/10/2016 01/17/2016  Falls in the past year? No No No No     Functional Status Survey: Is the patient deaf or have difficulty hearing?: No Does the patient have difficulty seeing, even when wearing glasses/contacts?: No Does the patient have difficulty concentrating, remembering, or making decisions?: No Does the patient have difficulty walking or climbing stairs?: No Does the patient have difficulty dressing or bathing?: No Does the patient have difficulty doing errands alone such as visiting a doctor's office or shopping?: No   Assessment & Plan  1. Hypertension, benign  She has not been taking bp medication, explained importance of taking her medications daily and complications of uncontrolled HTN -Comp panel  - CBC -EKG : normal EKG  2. Metabolic syndrome  She stopped Trulicity, states trying to  eat better - hgbA1C  3. Obesity (BMI 30.0-34.9)  Discussed life style modification -fasting insulin  4. Vitamin B12 deficiency  She needs to take supplements  5. Vitamin D deficiency  Taking rx vitamin D   6. Bronchitis  Likely viral, explained no need to take antibiotics at this time, she does not want an inhaler - benzonatate (TESSALON) 100 MG capsule; Take 1-2 capsules (100-200 mg total) by mouth 3 (three) times daily as needed.  Dispense: 40 capsule; Refill: 0 - dextromethorphan-guaiFENesin (MUCINEX DM) 30-600 MG 12hr tablet; Take 1 tablet by mouth 2 (two) times daily.  Dispense: 30 tablet; Refill: 0

## 2017-01-08 LAB — COMPLETE METABOLIC PANEL WITH GFR
ALBUMIN: 3.5 g/dL — AB (ref 3.6–5.1)
ALK PHOS: 100 U/L (ref 33–115)
ALT: 9 U/L (ref 6–29)
AST: 12 U/L (ref 10–35)
BILIRUBIN TOTAL: 0.3 mg/dL (ref 0.2–1.2)
BUN: 10 mg/dL (ref 7–25)
CO2: 26 mmol/L (ref 20–31)
Calcium: 8.4 mg/dL — ABNORMAL LOW (ref 8.6–10.2)
Chloride: 100 mmol/L (ref 98–110)
Creat: 0.63 mg/dL (ref 0.50–1.10)
GFR, Est African American: >89 mL/min (ref >=60)
GFR, Est Non African American: >89 mL/min (ref >=60)
Glucose, Bld: 89 mg/dL (ref 65–99)
Potassium: 4.1 mmol/L (ref 3.5–5.3)
Sodium: 134 mmol/L — ABNORMAL LOW (ref 135–146)
TOTAL PROTEIN: 7.1 g/dL (ref 6.1–8.1)

## 2017-01-08 LAB — HEMOGLOBIN A1C
Hgb A1c MFr Bld: 6 % — ABNORMAL HIGH (ref <5.7)
Mean Plasma Glucose: 126 mg/dL

## 2017-01-08 LAB — LIPID PANEL
CHOL/HDL RATIO: 3 ratio (ref <5.0)
Cholesterol: 127 mg/dL (ref <200)
HDL: 42 mg/dL — ABNORMAL LOW (ref >50)
LDL CALC: 73 mg/dL (ref <100)
Triglycerides: 58 mg/dL (ref <150)
VLDL: 12 mg/dL (ref <30)

## 2017-01-08 LAB — VITAMIN D 25 HYDROXY (VIT D DEFICIENCY, FRACTURES): VIT D 25 HYDROXY: 28 ng/mL — AB (ref 30–100)

## 2017-01-08 LAB — VITAMIN B12: VITAMIN B 12: 262 pg/mL (ref 200–1100)

## 2017-01-08 LAB — INSULIN, FASTING: Insulin fasting, serum: 14.5 u[IU]/mL (ref 2.0–19.6)

## 2017-02-06 ENCOUNTER — Ambulatory Visit: Payer: BC Managed Care – PPO | Admitting: Family Medicine

## 2017-03-01 ENCOUNTER — Telehealth: Payer: Self-pay | Admitting: Family Medicine

## 2017-03-01 DIAGNOSIS — E538 Deficiency of other specified B group vitamins: Secondary | ICD-10-CM

## 2017-03-01 MED ORDER — B-12 1000 MCG SL SUBL: 1.0000 | SUBLINGUAL_TABLET | Freq: Every day | SUBLINGUAL | 1 refills | Status: DC

## 2017-03-01 NOTE — Telephone Encounter (Signed)
PT NOTIFIED AND SAID WILL CALL BACK AND MAKE THE APPT.

## 2017-03-01 NOTE — Telephone Encounter (Signed)
Requesting a refill on vitamin b-12. Please send to [facility]. Pt is completely out

## 2017-03-05 ENCOUNTER — Telehealth: Payer: Self-pay

## 2017-03-05 NOTE — Telephone Encounter (Signed)
[facility] faxed us they are unable to get sublingual tablets. Please send RX for regular B-12 tablet.

## 2017-03-05 NOTE — Telephone Encounter (Signed)
Can she go to another pharmacy?

## 2017-03-06 NOTE — Telephone Encounter (Signed)
Informed patient about B12 SL being offered over the counter but if she does not want to buy otc. She can call around and see which pharmacy does offer the B12 1000 mcg SL and call us back with the pharmacy information.

## 2017-04-24 ENCOUNTER — Encounter: Payer: Self-pay | Admitting: Family Medicine

## 2017-04-24 ENCOUNTER — Ambulatory Visit (INDEPENDENT_AMBULATORY_CARE_PROVIDER_SITE_OTHER): Payer: BC Managed Care – PPO | Admitting: Family Medicine

## 2017-04-24 VITALS — BP 120/80 | HR 93 | Resp 12 | Wt 206.7 lb

## 2017-04-24 DIAGNOSIS — E786 Lipoprotein deficiency: Secondary | ICD-10-CM | POA: Diagnosis not present

## 2017-04-24 DIAGNOSIS — I10 Essential (primary) hypertension: Secondary | ICD-10-CM

## 2017-04-24 DIAGNOSIS — E8881 Metabolic syndrome: Secondary | ICD-10-CM

## 2017-04-24 DIAGNOSIS — K529 Noninfective gastroenteritis and colitis, unspecified: Secondary | ICD-10-CM | POA: Diagnosis not present

## 2017-04-24 DIAGNOSIS — E669 Obesity, unspecified: Secondary | ICD-10-CM

## 2017-04-24 DIAGNOSIS — E538 Deficiency of other specified B group vitamins: Secondary | ICD-10-CM

## 2017-04-24 DIAGNOSIS — E559 Vitamin D deficiency, unspecified: Secondary | ICD-10-CM

## 2017-04-24 DIAGNOSIS — E441 Mild protein-calorie malnutrition: Secondary | ICD-10-CM

## 2017-04-24 DIAGNOSIS — Z23 Encounter for immunization: Secondary | ICD-10-CM | POA: Diagnosis not present

## 2017-04-24 MED ORDER — LOSARTAN POTASSIUM 50 MG PO TABS: 50.0000 mg | ORAL_TABLET | Freq: Every day | ORAL | 1 refills | Status: DC

## 2017-04-24 MED ORDER — B-12 1000 MCG SL SUBL: 1.0000 | SUBLINGUAL_TABLET | Freq: Every day | SUBLINGUAL | 1 refills | Status: DC

## 2017-04-24 MED ORDER — VITAMIN D3 50 MCG (2000 UT) PO CAPS: 2000.0000 [IU] | ORAL_CAPSULE | Freq: Every day | ORAL | 1 refills | Status: DC

## 2017-04-24 NOTE — Progress Notes (Signed)
Name: Sara Foley   MRN: [id number]    DOB: 01-28-1966   Date:04/24/2017       Progress Note  Subjective  Chief Complaint  Chief Complaint  Patient presents with  . Hypertension  . Diarrhea  . Hyperlipidemia    HPI  HTN: she has not been compliant with medication, she denies chest pain or palpitation. BP is at goal today, she took medication this morning.   Metabolic Syndrome: last hgbA1C was 6 in June, discussed importance of a diabetic diet, she has family history of DM and also previous history of gestational diabetes, but she denies polyphagia, polyuria or polydipsia. She was given Trulicity back in Feb 2018 but she stopped on her own. She is avoiding bread, but not very knowledgeable about diet yet. Discussed going to life style modification but she wants to hold off.   Vitamin D deficiency: she has been taking Vitamin D supplementation   B12 deficiency: she is taking B12 supplementation otc. She is feeling better  Obesity: weight is stable, she does not want to take medications at this time. Discussed life style modification.   Enteritis: she states felt mild abdominal pain ( generalized ) pain two nights ago, she had multiple episodes of soft to loose stools, no blood or mucus, last bowel movement was yesterday around 4 pm, she is able to eat , but no nausea, vomiting or abdominal pain today. She had rice last night. She states she is feeling well now.   Low HDL: discussed life style modification  Patient Active Problem List   Diagnosis Date Noted  . Metabolic syndrome 02/28/2016  . Low HDL (under 40) 02/28/2016  . History of hysterectomy leaving cervix intact 01/19/2016  . Vitamin D deficiency 01/18/2016  . Vitamin B12 deficiency 01/18/2016  . Obesity (BMI 30.0-34.9) 01/17/2016  . History of gestational diabetes 01/17/2016  . Hypertension, benign 01/17/2016  . Hidradenitis suppurativa 01/17/2016  . History of iron deficiency anemia 01/17/2016  . Neck pain  01/17/2016    Past Surgical History:  Procedure Laterality Date  . ABDOMINAL HYSTERECTOMY  2012   partial  . BREAST BIOPSY Left 04-14-12   left breast stereotactic biopsy; fibroadenoma  . BREAST REDUCTION SURGERY  1999  . PARTIAL HYSTERECTOMY  July 2013   due to fibroid tumors  . REDUCTION MAMMAPLASTY Bilateral 2000    Family History  Problem Relation Age of Onset  . Prostate cancer Other   . Colon cancer Maternal Uncle   . Diabetes Mother   . Arthritis/Rheumatoid Mother   . Leukemia Father     Social History   Social History  . Marital status: Divorced    Spouse name: N/A  . Number of children: N/A  . Years of education: N/A   Occupational History  . Not on file.   Social History Main Topics  . Smoking status: Never Smoker  . Smokeless tobacco: Never Used  . Alcohol use 0.5 oz/week    1 Standard drinks or equivalent per week  . Drug use: No  . Sexual activity: Not Currently   Other Topics Concern  . Not on file   Social History Narrative  . No narrative on file     Current Outpatient Prescriptions:  .  Cyanocobalamin (B-12) 1000 MCG SUBL, Place 1 tablet under the tongue daily., Disp: 100 each, Rfl: 1 .  Cholecalciferol (VITAMIN D3) 2000 units capsule, Take 1 capsule (2,000 Units total) by mouth daily., Disp: 100 capsule, Rfl: 1 .  losartan (COZAAR)  50 MG tablet, Take 1 tablet (50 mg total) by mouth daily., Disp: 90 tablet, Rfl: 1  No Known Allergies   ROS  Constitutional: Negative for fever or weight change.  Respiratory: Negative for cough and shortness of breath.   Cardiovascular: Negative for chest pain or palpitations.  Gastrointestinal: Negative for abdominal pain, positive for  bowel changes ( diarrhea yesterday) .  Musculoskeletal: Negative for gait problem or joint swelling.  Skin: Negative for rash.  Neurological: Negative for dizziness or headache.  No other specific complaints in a complete review of systems (except as listed in HPI  above).  Objective  Vitals:   04/24/17 0749  BP: 120/80  Pulse: 93  Resp: 12  SpO2: 97%  Weight: 206 lb 11.2 oz (93.8 kg)    Body mass index is 34.4 kg/m.  Physical Exam  Constitutional: Patient appears well-developed and well-nourished. Obese No distress.  HEENT: head atraumatic, normocephalic, pupils equal and reactive to light,  neck supple, throat within normal limits Cardiovascular: Normal rate, regular rhythm and normal heart sounds.  No murmur heard. No BLE edema. Pulmonary/Chest: Effort normal and breath sounds normal. No respiratory distress. Abdominal: Soft.  There is no tenderness. Psychiatric: Patient has a normal mood and affect. behavior is normal. Judgment and thought content normal.  PHQ2/9: Depression screen [facility] 2/9 01/07/2017 09/19/2016 04/10/2016 01/17/2016  Decreased Interest 0 0 0 0  Down, Depressed, Hopeless 0 0 0 0  PHQ - 2 Score 0 0 0 0    Fall Risk: Fall Risk  04/24/2017 01/07/2017 09/19/2016 04/10/2016 01/17/2016  Falls in the past year? No No No No No      Assessment & Plan  1. Hypertension, benign  - losartan (COZAAR) 50 MG tablet; Take 1 tablet (50 mg total) by mouth daily.  Dispense: 90 tablet; Refill: 1  2. Vitamin B12 deficiency  - Cyanocobalamin (B-12) 1000 MCG SUBL; Place 1 tablet under the tongue daily.  Dispense: 100 each; Refill: 1  3. Enteritis  Resolving, gave reassurance, drink fluids and bland diet  4. Vitamin D deficiency  - Cholecalciferol (VITAMIN D3) 2000 units capsule; Take 1 capsule (2,000 Units total) by mouth daily.  Dispense: 100 capsule; Refill: 1  5. Needs flu shot  Refused  6. Metabolic syndrome  Discussed starting medication, but she wants to hold off for now  7. Obesity (BMI 30.0-34.9)  Discussed with the patient the risk posed by an increased BMI. Discussed importance of portion control, calorie counting and at least 150 minutes of physical activity weekly. Avoid sweet beverages and drink more water. Eat  at least 6 servings of fruit and vegetables daily   8. Low HDL (under 40)  Lipid panel shows low HDL : to improve HDL patient  needs to eat tree nuts ( pecans/pistachios/almonds ) four times weekly, eat fish two times weekly  and exercise  at least 150 minutes per week  9. Mild protein-calorie malnutrition (HCC)  Low albumin, discussed importance of increasing protein in her diet

## 2017-04-24 NOTE — Patient Instructions (Signed)
Diabetes Mellitus and Food It is important for you to manage your blood sugar (glucose) level. Your blood glucose level can be greatly affected by what you eat. Eating healthier foods in the appropriate amounts throughout the day at about the same time each day will help you control your blood glucose level. It can also help slow or prevent worsening of your diabetes mellitus. Healthy eating may even help you improve the level of your blood pressure and reach or maintain a healthy weight. General recommendations for healthful eating and cooking habits include:  Eating meals and snacks regularly. Avoid going long periods of time without eating to lose weight.  Eating a diet that consists mainly of plant-based foods, such as fruits, vegetables, nuts, legumes, and whole grains.  Using low-heat cooking methods, such as baking, instead of high-heat cooking methods, such as deep frying.  Work with your dietitian to make sure you understand how to use the Nutrition Facts information on food labels. How can food affect me? Carbohydrates Carbohydrates affect your blood glucose level more than any other type of food. Your dietitian will help you determine how many carbohydrates to eat at each meal and teach you how to count carbohydrates. Counting carbohydrates is important to keep your blood glucose at a healthy level, especially if you are using insulin or taking certain medicines for diabetes mellitus. Alcohol Alcohol can cause sudden decreases in blood glucose (hypoglycemia), especially if you use insulin or take certain medicines for diabetes mellitus. Hypoglycemia can be a life-threatening condition. Symptoms of hypoglycemia (sleepiness, dizziness, and disorientation) are similar to symptoms of having too much alcohol. If your health care provider has given you approval to drink alcohol, do so in moderation and use the following guidelines:  Women should not have more than one drink per day, and men  should not have more than two drinks per day. One drink is equal to: ? 12 oz of beer. ? 5 oz of wine. ? 1 oz of hard liquor.  Do not drink on an empty stomach.  Keep yourself hydrated. Have water, diet soda, or unsweetened iced tea.  Regular soda, juice, and other mixers might contain a lot of carbohydrates and should be counted.  What foods are not recommended? As you make food choices, it is important to remember that all foods are not the same. Some foods have fewer nutrients per serving than other foods, even though they might have the same number of calories or carbohydrates. It is difficult to get your body what it needs when you eat foods with fewer nutrients. Examples of foods that you should avoid that are high in calories and carbohydrates but low in nutrients include:  Trans fats (most processed foods list trans fats on the Nutrition Facts label).  Regular soda.  Juice.  Candy.  Sweets, such as cake, pie, doughnuts, and cookies.  Fried foods.  What foods can I eat? Eat nutrient-rich foods, which will nourish your body and keep you healthy. The food you should eat also will depend on several factors, including:  The calories you need.  The medicines you take.  Your weight.  Your blood glucose level.  Your blood pressure level.  Your cholesterol level.  You should eat a variety of foods, including:  Protein. ? Lean cuts of meat. ? Proteins low in saturated fats, such as fish, egg whites, and beans. Avoid processed meats.  Fruits and vegetables. ? Fruits and vegetables that may help control blood glucose levels, such as apples,   mangoes, and yams.  Dairy products. ? Choose fat-free or low-fat dairy products, such as milk, yogurt, and cheese.  Grains, bread, pasta, and rice. ? Choose whole grain products, such as multigrain bread, whole oats, and Vitanza rice. These foods may help control blood pressure.  Fats. ? Foods containing healthful fats, such as  nuts, avocado, olive oil, canola oil, and fish.  Does everyone with diabetes mellitus have the same meal plan? Because every person with diabetes mellitus is different, there is not one meal plan that works for everyone. It is very important that you meet with a dietitian who will help you create a meal plan that is just right for you. This information is not intended to replace advice given to you by your health care provider. Make sure you discuss any questions you have with your health care provider. Document Released: 04/12/2005 Document Revised: 12/22/2015 Document Reviewed: 06/12/2013 Elsevier Interactive Patient Education  2017 [employer] Inc[employer]  

## 2017-07-17 ENCOUNTER — Ambulatory Visit (INDEPENDENT_AMBULATORY_CARE_PROVIDER_SITE_OTHER): Payer: BC Managed Care – PPO | Admitting: Family Medicine

## 2017-07-17 ENCOUNTER — Encounter: Payer: Self-pay | Admitting: Family Medicine

## 2017-07-17 VITALS — BP 118/76 | HR 95 | Resp 12 | Ht 65.0 in | Wt 214.2 lb

## 2017-07-17 DIAGNOSIS — E669 Obesity, unspecified: Secondary | ICD-10-CM | POA: Diagnosis not present

## 2017-07-17 DIAGNOSIS — E441 Mild protein-calorie malnutrition: Secondary | ICD-10-CM

## 2017-07-17 DIAGNOSIS — E559 Vitamin D deficiency, unspecified: Secondary | ICD-10-CM

## 2017-07-17 DIAGNOSIS — E786 Lipoprotein deficiency: Secondary | ICD-10-CM | POA: Diagnosis not present

## 2017-07-17 DIAGNOSIS — I10 Essential (primary) hypertension: Secondary | ICD-10-CM | POA: Diagnosis not present

## 2017-07-17 DIAGNOSIS — E8881 Metabolic syndrome: Secondary | ICD-10-CM | POA: Diagnosis not present

## 2017-07-17 DIAGNOSIS — E538 Deficiency of other specified B group vitamins: Secondary | ICD-10-CM

## 2017-07-17 LAB — COMPREHENSIVE METABOLIC PANEL
AG Ratio: 1.1 (calc) (ref 1.0–2.5)
ALBUMIN MSPROF: 3.9 g/dL (ref 3.6–5.1)
ALT: 9 U/L (ref 6–29)
AST: 13 U/L (ref 10–35)
Alkaline phosphatase (APISO): 112 U/L (ref 33–115)
BUN: 12 mg/dL (ref 7–25)
CO2: 30 mmol/L (ref 20–32)
Calcium: 9 mg/dL (ref 8.6–10.2)
Chloride: 101 mmol/L (ref 98–110)
Creat: 0.59 mg/dL (ref 0.50–1.10)
GLUCOSE: 86 mg/dL (ref 65–99)
Globulin: 3.4 g/dL (calc) (ref 1.9–3.7)
POTASSIUM: 4.3 mmol/L (ref 3.5–5.3)
Sodium: 137 mmol/L (ref 135–146)
Total Bilirubin: 0.4 mg/dL (ref 0.2–1.2)
Total Protein: 7.3 g/dL (ref 6.1–8.1)

## 2017-07-17 NOTE — Progress Notes (Signed)
Name: Sara Foley   MRN: [id number]    DOB: 09-14-1965   Date:07/17/2017       Progress Note  Subjective  Chief Complaint  Chief Complaint  Patient presents with  . Hypertension  . Hyperlipidemia  . Medication Refill    3 month F/U    HPI  HTN: she has  been compliant with medication, she denies chest pain or palpitation. BP is slightly elevated, she took medication this morning but was rushing on her way here from work.   Metabolic Syndrome: last hgbA1C was 6 in June, we will recheck labs today, discussed importance of a diabetic diet, she has family history of DM and also previous history of gestational diabetes, but she denies polyphagia, polyuria or polydipsia. She was given Trulicity back in Feb 2018 but she stopped on her own, she does not like given herself shots, discussed Metformin but she refuses that also. She has not been compliant with her diet, eating out 3 times a week, likes to eat. .   Vitamin D deficiency: she has been taking Vitamin D supplementation   B12 deficiency: she is not sure if she is taking supplementation at this time. Advised to take it again   Obesity: weight is going up, gained 7 lbs since last visit. she does not want to take medications at this time. Discussed life style modification, she refuses to go at this time  Low HDL: discussed life style modification, she is eating more fish and tree nuts  Malnutrition: last labs showed low albumin    Patient Active Problem List   Diagnosis Date Noted  . Metabolic syndrome 02/28/2016  . Low HDL (under 40) 02/28/2016  . History of hysterectomy leaving cervix intact 01/19/2016  . Vitamin D deficiency 01/18/2016  . Vitamin B12 deficiency 01/18/2016  . Obesity (BMI 30.0-34.9) 01/17/2016  . History of gestational diabetes 01/17/2016  . Hypertension, benign 01/17/2016  . Hidradenitis suppurativa 01/17/2016  . History of iron deficiency anemia 01/17/2016  . Neck pain 01/17/2016    Past Surgical  History:  Procedure Laterality Date  . ABDOMINAL HYSTERECTOMY  2012   partial  . BREAST BIOPSY Left 04-14-12   left breast stereotactic biopsy; fibroadenoma  . BREAST REDUCTION SURGERY  1999  . PARTIAL HYSTERECTOMY  July 2013   due to fibroid tumors  . REDUCTION MAMMAPLASTY Bilateral 2000    Family History  Problem Relation Age of Onset  . Prostate cancer Other   . Colon cancer Maternal Uncle   . Diabetes Mother   . Arthritis/Rheumatoid Mother   . Leukemia Father     Social History   Socioeconomic History  . Marital status: Divorced    Spouse name: Not on file  . Number of children: Not on file  . Years of education: Not on file  . Highest education level: Not on file  Social Needs  . Financial resource strain: Not on file  . Food insecurity - worry: Not on file  . Food insecurity - inability: Not on file  . Transportation needs - medical: Not on file  . Transportation needs - non-medical: Not on file  Occupational History  . Not on file  Tobacco Use  . Smoking status: Never Smoker  . Smokeless tobacco: Never Used  Substance and Sexual Activity  . Alcohol use: Yes    Alcohol/week: 0.5 oz    Types: 1 Standard drinks or equivalent per week  . Drug use: No  . Sexual activity: Not Currently  Other  Topics Concern  . Not on file  Social History Narrative  . Not on file     Current Outpatient Medications:  .  Cholecalciferol (VITAMIN D3) 2000 units capsule, Take 1 capsule (2,000 Units total) by mouth daily., Disp: 100 capsule, Rfl: 1 .  Cyanocobalamin (B-12) 1000 MCG SUBL, Place 1 tablet under the tongue daily., Disp: 100 each, Rfl: 1 .  losartan (COZAAR) 50 MG tablet, Take 1 tablet (50 mg total) by mouth daily., Disp: 90 tablet, Rfl: 1  No Known Allergies   ROS  Constitutional: Patient appears well-developed and well-nourished. Obese  No distress.  HEENT: head atraumatic, normocephalic, pupils equal and reactive to light, neck supple, throat within normal  limits Cardiovascular: Normal rate, regular rhythm and normal heart sounds.  No murmur heard. No BLE edema. Pulmonary/Chest: Effort normal and breath sounds normal. No respiratory distress. Abdominal: Soft.  There is no tenderness. Psychiatric: Patient has a normal mood and affect. behavior is normal. Judgment and thought content normal.  Objective  Vitals:   07/17/17 1456 07/17/17 1532  BP: 140/90 118/76  Pulse: 95   Resp: 12   SpO2: 99%   Weight: 214 lb 3.2 oz (97.2 kg)   Height: 5\' 5"  (1.651 m)     Body mass index is 35.64 kg/m.  Physical Exam  Constitutional: Patient appears well-developed and well-nourished. Obese  No distress.  HEENT: head atraumatic, normocephalic, pupils equal and reactive to light,  neck supple, throat within normal limits Cardiovascular: Normal rate, regular rhythm and normal heart sounds.  No murmur heard. No BLE edema. Pulmonary/Chest: Effort normal and breath sounds normal. No respiratory distress. Abdominal: Soft.  There is no tenderness. Psychiatric: Patient has a normal mood and affect. behavior is normal. Judgment and thought content normal.  PHQ2/9: Depression screen [facility] 2/9 01/07/2017 09/19/2016 04/10/2016 01/17/2016  Decreased Interest 0 0 0 0  Down, Depressed, Hopeless 0 0 0 0  PHQ - 2 Score 0 0 0 0     Fall Risk: Fall Risk  07/17/2017 04/24/2017 01/07/2017 09/19/2016 04/10/2016  Falls in the past year? No No No No No     Functional Status Survey: Is the patient deaf or have difficulty hearing?: No Does the patient have difficulty seeing, even when wearing glasses/contacts?: No Does the patient have difficulty concentrating, remembering, or making decisions?: No Does the patient have difficulty walking or climbing stairs?: No Does the patient have difficulty dressing or bathing?: No Does the patient have difficulty doing errands alone such as visiting a doctor's office or shopping?: No    Assessment & Plan  1. Hypertension,  benign  - Comprehensive metabolic panel  2. Low HDL (under 40)  Discussed life style modification   3. Metabolic syndrome  - POCT HgB [id number]  4. Obesity (BMI 30.0-34.9)  Gaining weight, discussed life style modification   5. Vitamin B12 deficiency  Needs to resume supplementation   6. Vitamin D deficiency  Needs to take vitamin D   7. Mild protein-calorie malnutrition (HCC)  Recheck labs

## 2017-07-19 NOTE — Progress Notes (Signed)
LVM on [phone] @ 9:08 pm informing patient that her labs were normal, she no longer has low albumin.

## 2017-10-07 ENCOUNTER — Ambulatory Visit: Payer: BC Managed Care – PPO | Admitting: Family Medicine

## 2017-10-07 ENCOUNTER — Encounter: Payer: Self-pay | Admitting: Family Medicine

## 2017-10-07 VITALS — BP 150/86 | HR 86 | Temp 98.0°F | Resp 16 | Ht 65.0 in | Wt 214.2 lb

## 2017-10-07 DIAGNOSIS — R059 Cough, unspecified: Secondary | ICD-10-CM

## 2017-10-07 DIAGNOSIS — R202 Paresthesia of skin: Secondary | ICD-10-CM | POA: Diagnosis not present

## 2017-10-07 DIAGNOSIS — R7303 Prediabetes: Secondary | ICD-10-CM | POA: Diagnosis not present

## 2017-10-07 DIAGNOSIS — R05 Cough: Secondary | ICD-10-CM

## 2017-10-07 DIAGNOSIS — E786 Lipoprotein deficiency: Secondary | ICD-10-CM

## 2017-10-07 DIAGNOSIS — E538 Deficiency of other specified B group vitamins: Secondary | ICD-10-CM | POA: Diagnosis not present

## 2017-10-07 DIAGNOSIS — I10 Essential (primary) hypertension: Secondary | ICD-10-CM

## 2017-10-07 MED ORDER — BENZONATATE 100 MG PO CAPS: 100.0000 mg | ORAL_CAPSULE | Freq: Three times a day (TID) | ORAL | 0 refills | Status: DC | PRN

## 2017-10-07 MED ORDER — FLUTICASONE FUROATE-VILANTEROL 100-25 MCG/INH IN AEPB: 1.0000 | INHALATION_SPRAY | Freq: Every day | RESPIRATORY_TRACT | 0 refills | Status: DC

## 2017-10-07 MED ORDER — B-12 1000 MCG SL SUBL: 1.0000 | SUBLINGUAL_TABLET | Freq: Every day | SUBLINGUAL | 1 refills | Status: DC

## 2017-10-07 NOTE — Progress Notes (Signed)
Name: Sara Foley   MRN: [id number]    DOB: 05-23-1966   Date:10/07/2017       Progress Note  Subjective  Chief Complaint  Chief Complaint  Patient presents with  . Cough    Dry cough most of the time-productive sometimes-clear mucus. Has been taking Robitussin, warm tea and Coricidin-Horseness  . Headache    Onset-couple of weeks with shooting pains in back of head.    HPI  Cough: she does not recall URI, but over the past 3 weeks she has noticed a dry cough daily, but occasionally a clear sputum. No wheezing, SOB , fever or chills. Denies rhinorrhea or nasal congestion. She denies orthopnea or lower extremity edema. She feels fine, just annoyed about the cough not going away. She denies reflux symptoms. Appetite is normal  Paresthesia: she has noticed intermittent tingling going up from her left  nuchal area to her scalp. She has a history of cervical pain. She denies weakness . No focal findings. No visual problems. Lasts only a few seconds and on average once or twice weekly.  HTN: bp is uncontrolled today, last visit it was at goal, she does not want to change medication at this time. Denies chest pain or palpitation. She states she had coffee, pepsi, sausage biscuit and potato round from [employer] this morning.   Metabolic Syndrome: last hgbA1C was 6.0%, she is not following a diabetic diet. She denies polyphagia, polydipsia or polyuria.    Patient Active Problem List   Diagnosis Date Noted  . Metabolic syndrome 02/28/2016  . Low HDL (under 40) 02/28/2016  . History of hysterectomy leaving cervix intact 01/19/2016  . Vitamin D deficiency 01/18/2016  . Vitamin B12 deficiency 01/18/2016  . Obesity (BMI 30.0-34.9) 01/17/2016  . History of gestational diabetes 01/17/2016  . Hypertension, benign 01/17/2016  . Hidradenitis suppurativa 01/17/2016  . History of iron deficiency anemia 01/17/2016  . Neck pain 01/17/2016    Past Surgical History:  Procedure Laterality Date  .  ABDOMINAL HYSTERECTOMY  2012   partial  . BREAST BIOPSY Left 04-14-12   left breast stereotactic biopsy; fibroadenoma  . BREAST REDUCTION SURGERY  1999  . PARTIAL HYSTERECTOMY  July 2013   due to fibroid tumors  . REDUCTION MAMMAPLASTY Bilateral 2000    Family History  Problem Relation Age of Onset  . Prostate cancer Other   . Colon cancer Maternal Uncle   . Diabetes Mother   . Arthritis/Rheumatoid Mother   . Leukemia Father     Social History   Socioeconomic History  . Marital status: Divorced    Spouse name: Not on file  . Number of children: Not on file  . Years of education: Not on file  . Highest education level: Not on file  Social Needs  . Financial resource strain: Not on file  . Food insecurity - worry: Not on file  . Food insecurity - inability: Not on file  . Transportation needs - medical: Not on file  . Transportation needs - non-medical: Not on file  Occupational History  . Not on file  Tobacco Use  . Smoking status: Never Smoker  . Smokeless tobacco: Never Used  Substance and Sexual Activity  . Alcohol use: Yes    Alcohol/week: 0.5 oz    Types: 1 Standard drinks or equivalent per week  . Drug use: No  . Sexual activity: Not Currently  Other Topics Concern  . Not on file  Social History Narrative  . Not on  file     Current Outpatient Medications:  .  Cholecalciferol (VITAMIN D3) 2000 units capsule, Take 1 capsule (2,000 Units total) by mouth daily., Disp: 100 capsule, Rfl: 1 .  losartan (COZAAR) 50 MG tablet, Take 1 tablet (50 mg total) by mouth daily., Disp: 90 tablet, Rfl: 1 .  Cyanocobalamin (B-12) 1000 MCG SUBL, Place 1 tablet under the tongue daily. (Patient not taking: Reported on 10/07/2017), Disp: 100 each, Rfl: 1  No Known Allergies   ROS  Constitutional: Negative for fever or weight change.  Respiratory: Negative for cough and shortness of breath.   Cardiovascular: Negative for chest pain or palpitations.  Gastrointestinal:  Negative for abdominal pain, no bowel changes.  Musculoskeletal: Negative for gait problem or joint swelling.  Skin: Negative for rash.  Neurological: Negative for dizziness or headache.  No other specific complaints in a complete review of systems (except as listed in HPI above).  Objective  Vitals:   10/07/17 0912  BP: (!) 150/86  Pulse: 86  Resp: 16  Temp: 98 F (36.7 C)  TempSrc: Oral  SpO2: 98%  Weight: 214 lb 3.2 oz (97.2 kg)  Height: 5\' 5"  (1.651 m)    Body mass index is 35.64 kg/m.  Physical Exam  Constitutional: Patient appears well-developed and well-nourished. Obese No distress.  HEENT: head atraumatic, normocephalic, pupils equal and reactive to light,  neck supple, throat within normal limits Cardiovascular: Normal rate, regular rhythm and normal heart sounds.  No murmur heard. No BLE edema. Pulmonary/Chest: Effort normal and breath sounds normal. No respiratory distress. Abdominal: Soft.  There is no tenderness. Psychiatric: Patient has a normal mood and affect. behavior is normal. Judgment and thought content normal. Neurological : no focal findings. Pain during left head rotation with extension   Recent Results (from the past 2160 hour(s))  Comprehensive metabolic panel     Status: None   Collection Time: 07/17/17  3:33 PM  Result Value Ref Range   Glucose, Bld 86 65 - 99 mg/dL    Comment: .            Fasting reference interval .    BUN 12 7 - 25 mg/dL   Creat 1.61 0.96 - 0.45 mg/dL   BUN/Creatinine Ratio NOT APPLICABLE 6 - 22 (calc)   Sodium 137 135 - 146 mmol/L   Potassium 4.3 3.5 - 5.3 mmol/L   Chloride 101 98 - 110 mmol/L   CO2 30 20 - 32 mmol/L   Calcium 9.0 8.6 - 10.2 mg/dL   Total Protein 7.3 6.1 - 8.1 g/dL   Albumin 3.9 3.6 - 5.1 g/dL   Globulin 3.4 1.9 - 3.7 g/dL (calc)   AG Ratio 1.1 1.0 - 2.5 (calc)   Total Bilirubin 0.4 0.2 - 1.2 mg/dL   Alkaline phosphatase (APISO) 112 33 - 115 U/L   AST 13 10 - 35 U/L   ALT 9 6 - 29 U/L       PHQ2/9: Depression screen [facility] 2/9 10/07/2017 01/07/2017 09/19/2016 04/10/2016 01/17/2016  Decreased Interest 0 0 0 0 0  Down, Depressed, Hopeless 0 0 0 0 0  PHQ - 2 Score 0 0 0 0 0     Fall Risk: Fall Risk  10/07/2017 07/17/2017 04/24/2017 01/07/2017 09/19/2016  Falls in the past year? No No No No No    Functional Status Survey: Is the patient deaf or have difficulty hearing?: No Does the patient have difficulty seeing, even when wearing glasses/contacts?: No Does the patient have difficulty  concentrating, remembering, or making decisions?: No Does the patient have difficulty walking or climbing stairs?: No Does the patient have difficulty dressing or bathing?: No Does the patient have difficulty doing errands alone such as visiting a doctor's office or shopping?: No    Assessment & Plan  1. Hypertension, benign  Continue medication, discussed adding HCTZ but she refuses  2. Low HDL (under 40)  Lipid panel shows low HDL : to improve HDL patient  needs to eat tree nuts ( pecans/pistachios/almonds ) four times weekly, eat fish two times weekly  and exercise  at least 150 minutes per week  3. Cough in adult  Call back for CXR if no resolution in 2 weeks - fluticasone furoate-vilanterol (BREO ELLIPTA) 100-25 MCG/INH AEPB; Inhale 1 puff into the lungs daily.  Dispense: 60 each; Refill: 0 - benzonatate (TESSALON) 100 MG capsule; Take 1-2 capsules (100-200 mg total) by mouth 3 (three) times daily as needed.  Dispense: 40 capsule; Refill: 0  4. Pre-diabetes  Recheck labs next visit   5. Paresthesia  Explained likely from Facet disease, advised neck exercises  6. Vitamin B12 deficiency  - Cyanocobalamin (B-12) 1000 MCG SUBL; Place 1 tablet under the tongue daily.  Dispense: 100 each; Refill: 1

## 2017-11-28 ENCOUNTER — Other Ambulatory Visit: Payer: Self-pay | Admitting: Family Medicine

## 2017-11-28 DIAGNOSIS — I10 Essential (primary) hypertension: Secondary | ICD-10-CM

## 2018-01-16 ENCOUNTER — Ambulatory Visit: Payer: BC Managed Care – PPO | Admitting: Family Medicine

## 2018-01-21 ENCOUNTER — Ambulatory Visit: Payer: BC Managed Care – PPO | Admitting: Family Medicine

## 2018-03-03 ENCOUNTER — Other Ambulatory Visit: Payer: Self-pay | Admitting: Family Medicine

## 2018-03-17 ENCOUNTER — Other Ambulatory Visit: Payer: Self-pay | Admitting: Family Medicine

## 2018-03-17 DIAGNOSIS — I10 Essential (primary) hypertension: Secondary | ICD-10-CM

## 2018-03-17 NOTE — Telephone Encounter (Signed)
Sending 30 days. She needs follow up

## 2018-10-11 ENCOUNTER — Other Ambulatory Visit: Payer: Self-pay | Admitting: Family Medicine

## 2018-10-11 DIAGNOSIS — I10 Essential (primary) hypertension: Secondary | ICD-10-CM

## 2018-10-12 NOTE — Telephone Encounter (Signed)
She needs follow up, last visit was 09/2017.  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

## 2018-10-13 NOTE — Telephone Encounter (Signed)
appt made for the 3.17.2020

## 2018-10-14 ENCOUNTER — Other Ambulatory Visit: Payer: Self-pay

## 2018-10-14 ENCOUNTER — Ambulatory Visit: Payer: BC Managed Care – PPO | Admitting: Family Medicine

## 2018-10-14 ENCOUNTER — Encounter: Payer: Self-pay | Admitting: Family Medicine

## 2018-10-14 VITALS — BP 158/98 | HR 95 | Temp 98.4°F | Resp 16 | Ht 65.0 in | Wt 209.6 lb

## 2018-10-14 DIAGNOSIS — R7303 Prediabetes: Secondary | ICD-10-CM | POA: Diagnosis not present

## 2018-10-14 DIAGNOSIS — E786 Lipoprotein deficiency: Secondary | ICD-10-CM | POA: Diagnosis not present

## 2018-10-14 DIAGNOSIS — E559 Vitamin D deficiency, unspecified: Secondary | ICD-10-CM

## 2018-10-14 DIAGNOSIS — I10 Essential (primary) hypertension: Secondary | ICD-10-CM

## 2018-10-14 DIAGNOSIS — E538 Deficiency of other specified B group vitamins: Secondary | ICD-10-CM

## 2018-10-14 DIAGNOSIS — Z1211 Encounter for screening for malignant neoplasm of colon: Secondary | ICD-10-CM

## 2018-10-14 DIAGNOSIS — Z1239 Encounter for other screening for malignant neoplasm of breast: Secondary | ICD-10-CM

## 2018-10-14 MED ORDER — LOSARTAN POTASSIUM 100 MG PO TABS: 100.0000 mg | ORAL_TABLET | Freq: Every day | ORAL | 1 refills | Status: DC

## 2018-10-14 MED ORDER — B-12 1000 MCG SL SUBL: 1.0000 | SUBLINGUAL_TABLET | Freq: Every day | SUBLINGUAL | 1 refills | Status: DC

## 2018-10-14 MED ORDER — VITAMIN D3 50 MCG (2000 UT) PO TABS: 1.0000 | ORAL_TABLET | Freq: Every day | ORAL | 1 refills | Status: DC

## 2018-10-14 NOTE — Progress Notes (Signed)
Name: Sara Foley   MRN: [id number]    DOB: 1966/05/09   Date:10/14/2018       Progress Note  Subjective  Chief Complaint  Chief Complaint  Patient presents with  . Medication Refill    HPI   Paresthesia: she has noticed intermittent tingling going up from her left  nuchal area to her scalp. She has a history of cervical pain. She denies weakness . No focal findings. No visual problems. Lasts only a few seconds and on average once or twice weekly. She states symptoms are almost gone, forgot about it until I asked   HTN: bp is uncontrolled today, last visit was one year ago, she lost to follow up, she is on losartan 50 mg but we will increase to 100 mg and ask her to return for bp check in the next week or so. No chest pain or palpitation.   Metabolic Syndrome: last hgbA1C was 6.0%, she is not following a diabetic diet. She denies polyphagia, polydipsia or polyuria. She has obesity. She also has family history of diabetes   Vitamin D deficiency: she has not been taking supplements in months, we will recheck level and sent a refill   B12 deficiency: she has been out of supplements, we will recheck labs  Obesity:weight went down 5 lbs since last year. She states she has been walking more often, at most once a week and is trying to eat healthier, limiting junk food   Low HDL: discussed life style modification, she is eating more fish and tree nuts. Recheck labs  Patient Active Problem List   Diagnosis Date Noted  . Metabolic syndrome 02/28/2016  . Low HDL (under 40) 02/28/2016  . History of hysterectomy leaving cervix intact 01/19/2016  . Vitamin D deficiency 01/18/2016  . Vitamin B12 deficiency 01/18/2016  . Obesity (BMI 30.0-34.9) 01/17/2016  . History of gestational diabetes 01/17/2016  . Hypertension, benign 01/17/2016  . Hidradenitis suppurativa 01/17/2016  . History of iron deficiency anemia 01/17/2016  . Neck pain 01/17/2016    Past Surgical History:   Procedure Laterality Date  . ABDOMINAL HYSTERECTOMY  2012   partial  . BREAST BIOPSY Left 04-14-12   left breast stereotactic biopsy; fibroadenoma  . BREAST REDUCTION SURGERY  1999  . PARTIAL HYSTERECTOMY  July 2013   due to fibroid tumors  . REDUCTION MAMMAPLASTY Bilateral 2000    Family History  Problem Relation Age of Onset  . Prostate cancer Other   . Colon cancer Maternal Uncle   . Diabetes Mother   . Arthritis/Rheumatoid Mother   . Leukemia Father     Social History   Socioeconomic History  . Marital status: Divorced    Spouse name: Not on file  . Number of children: 3  . Years of education: Not on file  . Highest education level: Not on file  Occupational History  . Not on file  Social Needs  . Financial resource strain: Somewhat hard  . Food insecurity:    Worry: Never true    Inability: Never true  . Transportation needs:    Medical: No    Non-medical: No  Tobacco Use  . Smoking status: Never Smoker  . Smokeless tobacco: Never Used  Substance and Sexual Activity  . Alcohol use: Yes    Alcohol/week: 1.0 standard drinks    Types: 1 Standard drinks or equivalent per week  . Drug use: No  . Sexual activity: Not Currently  Lifestyle  . Physical activity:  Days per week: 1 day    Minutes per session: 30 min  . Stress: Not at all  Relationships  . Social connections:    Talks on phone: More than three times a week    Gets together: More than three times a week    Attends religious service: More than 4 times per year    Active member of club or organization: Yes    Attends meetings of clubs or organizations: More than 4 times per year    Relationship status: Divorced  . Intimate partner violence:    Fear of current or ex partner: No    Emotionally abused: No    Physically abused: No    Forced sexual activity: No  Other Topics Concern  . Not on file  Social History Narrative  . Not on file     Current Outpatient Medications:  .  benzonatate  (TESSALON) 100 MG capsule, Take 1-2 capsules (100-200 mg total) by mouth 3 (three) times daily as needed., Disp: 40 capsule, Rfl: 0 .  Cholecalciferol (VITAMIN D3) 50 MCG (2000 UT) TABS, Take 1 tablet by mouth daily., Disp: 100 tablet, Rfl: 1 .  Cyanocobalamin (B-12) 1000 MCG SUBL, Place 1 tablet under the tongue daily., Disp: 100 each, Rfl: 1 .  losartan (COZAAR) 100 MG tablet, Take 1 tablet (100 mg total) by mouth daily., Disp: 90 tablet, Rfl: 1  No Known Allergies  I personally reviewed active problem list, medication list, allergies, family history, social history with the patient/caregiver today.   ROS  Constitutional: Negative for fever or weight change.  Respiratory: Negative for cough and shortness of breath.   Cardiovascular: Negative for chest pain or palpitations.  Gastrointestinal: Negative for abdominal pain, no bowel changes.  Musculoskeletal: Negative for gait problem or joint swelling.  Skin: Negative for rash.  Neurological: Negative for dizziness or headache.  No other specific complaints in a complete review of systems (except as listed in HPI above).  Objective  Vitals:   10/14/18 1518  BP: (!) 140/100  Pulse: 95  Resp: 16  Temp: 98.4 F (36.9 C)  TempSrc: Oral  SpO2: 97%  Weight: 209 lb 9.6 oz (95.1 kg)  Height: 5\' 5"  (1.651 m)    Body mass index is 34.88 kg/m.  Physical Exam  Constitutional: Patient appears well-developed and well-nourished. Obese  No distress.  HEENT: head atraumatic, normocephalic, pupils equal and reactive to light,  neck supple, throat within normal limits Cardiovascular: Normal rate, regular rhythm and normal heart sounds.  No murmur heard. No BLE edema. Pulmonary/Chest: Effort normal and breath sounds normal. No respiratory distress. Abdominal: Soft.  There is no tenderness. Psychiatric: Patient has a normal mood and affect. behavior is normal. Judgment and thought content normal.  PHQ2/9: Depression screen [facility] 2/9  10/14/2018 10/07/2017 01/07/2017 09/19/2016 04/10/2016  Decreased Interest 0 0 0 0 0  Down, Depressed, Hopeless 0 0 0 0 0  PHQ - 2 Score 0 0 0 0 0  Altered sleeping 0 - - - -  Tired, decreased energy 0 - - - -  Change in appetite 0 - - - -  Feeling bad or failure about yourself  0 - - - -  Trouble concentrating 0 - - - -  Moving slowly or fidgety/restless 0 - - - -  Suicidal thoughts 0 - - - -  PHQ-9 Score 0 - - - -     Fall Risk: Fall Risk  10/14/2018 10/14/2018 10/07/2017 07/17/2017 04/24/2017  Falls in  the past year? 0 0 No No No  Number falls in past yr: 0 0 - - -  Injury with Fall? 0 0 - - -      Assessment & Plan  1. Hypertension, benign  - losartan (COZAAR) 100 MG tablet; Take 1 tablet (100 mg total) by mouth daily.  Dispense: 90 tablet; Refill: 1 - COMPLETE METABOLIC PANEL WITH GFR - CBC with Differential/Platelet  2. Vitamin B12 deficiency  - Cyanocobalamin (B-12) 1000 MCG SUBL; Place 1 tablet under the tongue daily.  Dispense: 100 each; Refill: 1 - B12 and Folate Panel  3. Pre-diabetes  - Hemoglobin A1c  4. Low HDL (under 40)  - Lipid panel  5. Vitamin D deficiency  - Cholecalciferol (VITAMIN D3) 50 MCG (2000 UT) TABS; Take 1 tablet by mouth daily.  Dispense: 100 tablet; Refill: 1 - VITAMIN D 25 Hydroxy (Vit-D Deficiency, Fractures)

## 2018-10-15 ENCOUNTER — Other Ambulatory Visit: Payer: Self-pay | Admitting: Family Medicine

## 2018-10-15 LAB — COMPLETE METABOLIC PANEL WITH GFR
AG RATIO: 1.1 (calc) (ref 1.0–2.5)
ALT: 11 U/L (ref 6–29)
AST: 14 U/L (ref 10–35)
Albumin: 3.8 g/dL (ref 3.6–5.1)
Alkaline phosphatase (APISO): 100 U/L (ref 37–153)
BUN: 12 mg/dL (ref 7–25)
CALCIUM: 8.9 mg/dL (ref 8.6–10.4)
CO2: 28 mmol/L (ref 20–32)
Chloride: 104 mmol/L (ref 98–110)
Creat: 0.67 mg/dL (ref 0.50–1.05)
GFR, EST AFRICAN AMERICAN: 118 mL/min/{1.73_m2} (ref >=60)
GFR, EST NON AFRICAN AMERICAN: 102 mL/min/{1.73_m2} (ref >=60)
GLOBULIN: 3.4 g/dL (ref 1.9–3.7)
Glucose, Bld: 88 mg/dL (ref 65–99)
Potassium: 3.6 mmol/L (ref 3.5–5.3)
SODIUM: 140 mmol/L (ref 135–146)
TOTAL PROTEIN: 7.2 g/dL (ref 6.1–8.1)
Total Bilirubin: 0.4 mg/dL (ref 0.2–1.2)

## 2018-10-15 LAB — LIPID PANEL
CHOL/HDL RATIO: 3.5 (calc) (ref <5.0)
CHOLESTEROL: 143 mg/dL (ref <200)
HDL: 41 mg/dL — AB (ref >=50)
LDL Cholesterol (Calc): 84 mg/dL (calc)
Non-HDL Cholesterol (Calc): 102 mg/dL (calc) (ref <130)
Triglycerides: 88 mg/dL (ref <150)

## 2018-10-15 LAB — CBC WITH DIFFERENTIAL/PLATELET
ABSOLUTE MONOCYTES: 456 {cells}/uL (ref 200–950)
Basophils Absolute: 20 cells/uL (ref 0–200)
Basophils Relative: 0.3 %
Eosinophils Absolute: 109 cells/uL (ref 15–500)
Eosinophils Relative: 1.6 %
HEMATOCRIT: 37.7 % (ref 35.0–45.0)
Hemoglobin: 12.6 g/dL (ref 11.7–15.5)
Lymphs Abs: 1299 cells/uL (ref 850–3900)
MCH: 27 pg (ref 27.0–33.0)
MCHC: 33.4 g/dL (ref 32.0–36.0)
MCV: 80.9 fL (ref 80.0–100.0)
MPV: 10.7 fL (ref 7.5–12.5)
Monocytes Relative: 6.7 %
Neutro Abs: 4916 cells/uL (ref 1500–7800)
Neutrophils Relative %: 72.3 %
Platelets: 307 10*3/uL (ref 140–400)
RBC: 4.66 10*6/uL (ref 3.80–5.10)
RDW: 13.9 % (ref 11.0–15.0)
Total Lymphocyte: 19.1 %
WBC: 6.8 10*3/uL (ref 3.8–10.8)

## 2018-10-15 LAB — B12 AND FOLATE PANEL
Folate: 9.4 ng/mL
Vitamin B-12: 250 pg/mL (ref 200–1100)

## 2018-10-15 LAB — HEMOGLOBIN A1C
Hgb A1c MFr Bld: 6.2 % of total Hgb — ABNORMAL HIGH (ref <5.7)
MEAN PLASMA GLUCOSE: 131 (calc)
eAG (mmol/L): 7.3 (calc)

## 2018-10-15 LAB — VITAMIN D 25 HYDROXY (VIT D DEFICIENCY, FRACTURES): Vit D, 25-Hydroxy: 22 ng/mL — ABNORMAL LOW (ref 30–100)

## 2018-10-15 MED ORDER — VITAMIN D (ERGOCALCIFEROL) 1.25 MG (50000 UNIT) PO CAPS: 50000.0000 [IU] | ORAL_CAPSULE | ORAL | 0 refills | Status: DC

## 2018-10-21 ENCOUNTER — Other Ambulatory Visit: Payer: Self-pay

## 2018-10-21 ENCOUNTER — Ambulatory Visit (INDEPENDENT_AMBULATORY_CARE_PROVIDER_SITE_OTHER): Payer: BC Managed Care – PPO

## 2018-10-21 VITALS — BP 120/80 | HR 85

## 2018-10-21 DIAGNOSIS — Z0130 Encounter for examination of blood pressure without abnormal findings: Secondary | ICD-10-CM

## 2018-10-21 NOTE — Progress Notes (Signed)
Patient is here for a blood pressure check. Patient denies chest pain, palpitations, shortness of breath or visual disturbances. At previous visit blood pressure was 158/98 with a heart rate of 95. Today during nurse visit first check blood pressure was 120/80 with a heart rate of 85. She does take blood pressure medications and she has not missed any doses.

## 2018-10-28 ENCOUNTER — Other Ambulatory Visit: Payer: Self-pay | Admitting: Family Medicine

## 2018-10-28 DIAGNOSIS — E538 Deficiency of other specified B group vitamins: Secondary | ICD-10-CM

## 2018-10-28 NOTE — Telephone Encounter (Signed)
Please resend sent to wrong pharmacy on 10/14/2018. Send to [facility] on [address]

## 2018-11-18 LAB — COLOGUARD: Cologuard: NEGATIVE

## 2018-11-21 ENCOUNTER — Encounter: Payer: Self-pay | Admitting: Family Medicine

## 2019-01-19 ENCOUNTER — Telehealth: Payer: Self-pay | Admitting: Family Medicine

## 2019-01-19 NOTE — Telephone Encounter (Signed)
Pt reports possible exposure last Wednesday or Thursday at school. States she was notified by work place a Art therapist had tested positive. Pt reports dry intermittent cough, no other symptoms. 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 Ancil Boozer ,  is high risk. Requesting testing, please advise:  CB# [phone]

## 2019-01-19 NOTE — Telephone Encounter (Signed)
Please schedule first available virtual appt

## 2019-01-20 ENCOUNTER — Ambulatory Visit (INDEPENDENT_AMBULATORY_CARE_PROVIDER_SITE_OTHER): Payer: BC Managed Care – PPO | Admitting: Nurse Practitioner

## 2019-01-20 ENCOUNTER — Encounter: Payer: Self-pay | Admitting: Nurse Practitioner

## 2019-01-20 ENCOUNTER — Other Ambulatory Visit: Payer: Self-pay

## 2019-01-20 VITALS — Temp 97.9°F | Resp 16

## 2019-01-20 DIAGNOSIS — E8881 Metabolic syndrome: Secondary | ICD-10-CM

## 2019-01-20 DIAGNOSIS — R059 Cough, unspecified: Secondary | ICD-10-CM

## 2019-01-20 DIAGNOSIS — Z20828 Contact with and (suspected) exposure to other viral communicable diseases: Secondary | ICD-10-CM

## 2019-01-20 DIAGNOSIS — E786 Lipoprotein deficiency: Secondary | ICD-10-CM

## 2019-01-20 DIAGNOSIS — R05 Cough: Secondary | ICD-10-CM | POA: Diagnosis not present

## 2019-01-20 DIAGNOSIS — Z20822 Contact with and (suspected) exposure to covid-19: Secondary | ICD-10-CM

## 2019-01-20 DIAGNOSIS — Z719 Counseling, unspecified: Secondary | ICD-10-CM

## 2019-01-20 NOTE — Patient Instructions (Addendum)
I suspect you may have COVID-19/coronavirus and recommend that you are tested to confirm.  Please call [facility]'s COVID-19 hotline at [phone] to determine if you are eligible for testing.  

## 2019-01-20 NOTE — Progress Notes (Signed)
Virtual Visit via Video Note  I connected with Sara Foley on 01/20/19 at  2:40 PM EDT by a video enabled telemedicine application and verified that I am speaking with the correct person using two identifiers.   Staff discussed the limitations of evaluation and management by telemedicine and the availability of in person appointments. The patient expressed understanding and agreed to proceed.  Patient location: home  My location: work office Other people present:  none HPI  Patient endorses positive COVID19 contact at work last week. Patient endorses cough that is mild and intermittent for the past few months, states just coughs to clear her throat. 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   Patient additionally wants to discuss taking fishoil and chicory root supplements. She has metabolic syndrome and low HDL. She does not eat fish often, has been trying to increase intake of tree nuts and fiber but states she does eat a lot of potatoes.  Lab Results  Component Value Date   CHOL 143 10/14/2018   HDL 41 (L) 10/14/2018   LDLCALC 84 10/14/2018   TRIG 88 10/14/2018   CHOLHDL 3.5 10/14/2018     PHQ2/9: Depression screen [facility] 2/9 01/20/2019 10/14/2018 10/07/2017 01/07/2017 09/19/2016  Decreased Interest 0 0 0 0 0  Down, Depressed, Hopeless 0 0 0 0 0  PHQ - 2 Score 0 0 0 0 0  Altered sleeping 0 0 - - -  Tired, decreased energy 0 0 - - -  Change in appetite 0 0 - - -  Feeling bad or failure about yourself  0 0 - - -  Trouble concentrating 0 0 - - -  Moving slowly or fidgety/restless 0 0 - - -  Suicidal thoughts 0 0 - - -  PHQ-9 Score 0 0 - - -  Difficult doing work/chores Not difficult at all - - - -    PHQ reviewed. Negative  Patient Active Problem List   Diagnosis Date Noted  . Metabolic syndrome 02/28/2016  . Low HDL (under 40) 02/28/2016  . History of hysterectomy leaving cervix intact 01/19/2016  . Vitamin D  deficiency 01/18/2016  . Vitamin B12 deficiency 01/18/2016  . Obesity (BMI 30.0-34.9) 01/17/2016  . History of gestational diabetes 01/17/2016  . Hypertension, benign 01/17/2016  . Hidradenitis suppurativa 01/17/2016  . History of iron deficiency anemia 01/17/2016  . Neck pain 01/17/2016    Past Medical History:  Diagnosis Date  . Abnormal mammogram, unspecified 2013   illdefined left inferior density; ultrasound was normal  . Breast screening, unspecified   . Gestational diabetes    1996  . Hidradenitis 2013   hidradentitis suppurativa  . Obesity, unspecified   . Screening for obesity   . Unspecified essential hypertension     Past Surgical History:  Procedure Laterality Date  . ABDOMINAL HYSTERECTOMY  2012   partial  . BREAST BIOPSY Left 04-14-12   left breast stereotactic biopsy; fibroadenoma  . BREAST REDUCTION SURGERY  1999  . PARTIAL HYSTERECTOMY  July 2013   due to fibroid tumors  . REDUCTION MAMMAPLASTY Bilateral 2000    Social History   Tobacco Use  . Smoking status: Never Smoker  . Smokeless tobacco: Never Used  Substance Use Topics  . Alcohol use: Yes    Alcohol/week: 1.0 standard drinks    Types: 1 Standard drinks or equivalent per week     Current Outpatient Medications:  .  Cholecalciferol (VITAMIN D3)  50 MCG (2000 UT) TABS, Take 1 tablet by mouth daily., Disp: 100 tablet, Rfl: 1 .  Cyanocobalamin (VITAMIN B-12) 1000 MCG SUBL, PLACE 1 TABLET UNDER THE TONGUE DAILY, Disp: 100 each, Rfl: 0 .  losartan (COZAAR) 100 MG tablet, Take 1 tablet (100 mg total) by mouth daily., Disp: 90 tablet, Rfl: 1  No Known Allergies  ROS   No other specific complaints in a complete review of systems (except as listed in HPI above).  Objective  Vitals:   01/20/19 1413  Resp: 16  Temp: 97.9 F (36.6 C)     There is no height or weight on file to calculate BMI.  Nursing Note and Vital Signs reviewed.  Physical Exam   Constitutional: Patient appears  well-developed and well-nourished. No distress.  HENT: Head: Normocephalic and atraumatic. Pulmonary/Chest: Effort normal  Musculoskeletal: Normal range of motion,  Neurological: alert and oriented, speech normal.  Skin: No rash noted. No erythema.  Psychiatric: Patient has a normal mood and affect. behavior is normal. Judgment and thought content normal.    Assessment & Plan 1. Exposure to Covid-19 Virus Does not meet present criteria here for testing, provided [facility] triage line number. Recommended self-quarantine for 14 days and have other family members check on her mother for her.   2. Low HDL (under 40) Discussed diet and supplements  3. Metabolic syndrome Discussed diet and supplements  4. Cough Due to length of cough think it is related to allergies, however recommend self-quarantine out of an abundance of caution due to pandemic. Recommend trial of antihistamine.   5. Health education/counseling Discussed diet and supplements    Follow Up Instructions:    I discussed the assessment and treatment plan with the patient. The patient was provided an opportunity to ask questions and all were answered. The patient agreed with the plan and demonstrated an understanding of the instructions.   The patient was advised to call back or seek an in-person evaluation if the symptoms worsen or if the condition fails to improve as anticipated.  I provided 25 minutes of non-face-to-face time during this encounter.   Fredderick Severance, NP

## 2019-01-28 ENCOUNTER — Other Ambulatory Visit (HOSPITAL_COMMUNITY)
Admission: RE | Admit: 2019-01-28 | Discharge: 2019-01-28 | Disposition: A | Discharge status: Home / Self Care | Payer: BC Managed Care – PPO | Source: Ambulatory Visit | Attending: Family Medicine | Admitting: Family Medicine

## 2019-01-28 ENCOUNTER — Ambulatory Visit (INDEPENDENT_AMBULATORY_CARE_PROVIDER_SITE_OTHER): Payer: BC Managed Care – PPO | Admitting: Family Medicine

## 2019-01-28 ENCOUNTER — Other Ambulatory Visit: Payer: Self-pay

## 2019-01-28 ENCOUNTER — Encounter: Payer: Self-pay | Admitting: Family Medicine

## 2019-01-28 VITALS — BP 126/84 | HR 97 | Temp 97.3°F | Resp 16 | Ht 65.0 in | Wt 215.3 lb

## 2019-01-28 DIAGNOSIS — Z124 Encounter for screening for malignant neoplasm of cervix: Secondary | ICD-10-CM | POA: Insufficient documentation

## 2019-01-28 DIAGNOSIS — L732 Hidradenitis suppurativa: Secondary | ICD-10-CM | POA: Diagnosis not present

## 2019-01-28 DIAGNOSIS — R1900 Intra-abdominal and pelvic swelling, mass and lump, unspecified site: Secondary | ICD-10-CM | POA: Diagnosis not present

## 2019-01-28 DIAGNOSIS — Z01419 Encounter for gynecological examination (general) (routine) without abnormal findings: Secondary | ICD-10-CM | POA: Insufficient documentation

## 2019-01-28 MED ORDER — DOXYCYCLINE HYCLATE 100 MG PO TABS: 100.0000 mg | ORAL_TABLET | Freq: Two times a day (BID) | ORAL | 0 refills | Status: DC

## 2019-01-28 NOTE — Progress Notes (Signed)
Name: Sara Foley   MRN: [id number]    DOB: 03-Jul-1966   Date:01/28/2019       Progress Note  Subjective  Chief Complaint  Chief Complaint  Patient presents with  . Well woman exam    HPI   Patient presents for annual CPE   Hidradenitis suppurative: she has a lesion on buttocks and left axilla that is currently draining. Not on antibiotics, she has recurrent problems.    Diet: she has been at home and snacking throughout the day  Exercise: not currently, but she will resume walking, discussed going early in the morning   USPSTF grade A and B recommendations    Office Visit from 01/28/2019 in [facility]  AUDIT-C Score  1     Depression: Phq 9 is  negative Depression screen [facility] 2/9 01/28/2019 01/20/2019 10/14/2018 10/07/2017 01/07/2017  Decreased Interest 0 0 0 0 0  Down, Depressed, Hopeless 0 0 0 0 0  PHQ - 2 Score 0 0 0 0 0  Altered sleeping 0 0 0 - -  Tired, decreased energy 0 0 0 - -  Change in appetite 1 0 0 - -  Feeling bad or failure about yourself  0 0 0 - -  Trouble concentrating 1 0 0 - -  Moving slowly or fidgety/restless 0 0 0 - -  Suicidal thoughts 0 0 0 - -  PHQ-9 Score 2 0 0 - -  Difficult doing work/chores Not difficult at all Not difficult at all - - -   Hypertension: BP Readings from Last 3 Encounters:  01/28/19 126/84  10/21/18 120/80  10/14/18 (!) 158/98   Obesity: Wt Readings from Last 3 Encounters:  01/28/19 215 lb 4.8 oz (97.7 kg)  10/14/18 209 lb 9.6 oz (95.1 kg)  10/07/17 214 lb 3.2 oz (97.2 kg)   BMI Readings from Last 3 Encounters:  01/28/19 35.83 kg/m  10/14/18 34.88 kg/m  10/07/17 35.64 kg/m    Hep C Screening: she wants to hold off for now  STD testing and prevention (HIV/chl/gon/syphilis): N/A Intimate partner violence: negative screen  Sexual History/Pain during Intercourse: not currently sexually active - over one year  Menstrual History/LMP/Abnormal Bleeding: s/p hysterectomy  Incontinence Symptoms: none    Advanced Care Planning: A voluntary discussion about advance care planning including the explanation and discussion of advance directives.  Discussed health care proxy and Living will, and the patient was able to identify a health care proxy as Lauren .  Patient does not have a living will at present time. If patient does have living will, I have requested they bring this to the clinic to be scanned in to their chart.  Breast cancer: up to date  BRCA gene screening: N/A Cervical cancer screening: today   Osteoporosis Screening:start at 40   Lipids:  Lab Results  Component Value Date   CHOL 143 10/14/2018   CHOL 127 01/07/2017   CHOL 152 01/17/2016   Lab Results  Component Value Date   HDL 41 (L) 10/14/2018   HDL 42 (L) 01/07/2017   HDL 45 (L) 01/17/2016   Lab Results  Component Value Date   LDLCALC 84 10/14/2018   LDLCALC 73 01/07/2017   LDLCALC 92 01/17/2016   Lab Results  Component Value Date   TRIG 88 10/14/2018   TRIG 58 01/07/2017   TRIG 76 01/17/2016   Lab Results  Component Value Date   CHOLHDL 3.5 10/14/2018   CHOLHDL 3.0 01/07/2017   CHOLHDL 3.4  01/17/2016   No results found for: LDLDIRECT  Glucose:  Glucose, Bld  Date Value Ref Range Status  10/14/2018 88 65 - 99 mg/dL Final    Comment:    .            Fasting reference interval .   07/17/2017 86 65 - 99 mg/dL Final    Comment:    .            Fasting reference interval .   01/07/2017 89 65 - 99 mg/dL Final    Skin cancer: discussed atypical lesions Colorectal cancer: negative Cologuard  Lung cancer:  Low Dose CT Chest recommended if Age 67-80 years, 30 pack-year currently smoking OR have quit w/in 15years. Patient does not qualify.   ECG:12/2016  Patient Active Problem List   Diagnosis Date Noted  . Metabolic syndrome 05/29/2810  . Low HDL (under 40) 02/28/2016  . History of hysterectomy leaving cervix intact 01/19/2016  . Vitamin D deficiency 01/18/2016  . Vitamin B12 deficiency  01/18/2016  . Obesity (BMI 30.0-34.9) 01/17/2016  . History of gestational diabetes 01/17/2016  . Hypertension, benign 01/17/2016  . Hidradenitis suppurativa 01/17/2016  . History of iron deficiency anemia 01/17/2016  . Neck pain 01/17/2016    Past Surgical History:  Procedure Laterality Date  . ABDOMINAL HYSTERECTOMY  2012   partial  . BREAST BIOPSY Left 04-14-12   left breast stereotactic biopsy; fibroadenoma  . BREAST REDUCTION SURGERY  1999  . PARTIAL HYSTERECTOMY  July 2013   due to fibroid tumors  . REDUCTION MAMMAPLASTY Bilateral 2000    Family History  Problem Relation Age of Onset  . Prostate cancer Other   . Colon cancer Maternal Uncle   . Diabetes Mother   . Arthritis/Rheumatoid Mother   . Leukemia Father     Social History   Socioeconomic History  . Marital status: Divorced    Spouse name: Not on file  . Number of children: 3  . Years of education: Not on file  . Highest education level: Not on file  Occupational History  . Not on file  Social Needs  . Financial resource strain: Somewhat hard  . Food insecurity    Worry: Never true    Inability: Never true  . Transportation needs    Medical: No    Non-medical: No  Tobacco Use  . Smoking status: Never Smoker  . Smokeless tobacco: Never Used  Substance and Sexual Activity  . Alcohol use: Yes    Alcohol/week: 3.0 standard drinks    Types: 3 Standard drinks or equivalent per week  . Drug use: No  . Sexual activity: Not Currently  Lifestyle  . Physical activity    Days per week: 5 days    Minutes per session: 10 min  . Stress: Not at all  Relationships  . Social connections    Talks on phone: More than three times a week    Gets together: More than three times a week    Attends religious service: More than 4 times per year    Active member of club or organization: Yes    Attends meetings of clubs or organizations: More than 4 times per year    Relationship status: Divorced  . Intimate  partner violence    Fear of current or ex partner: No    Emotionally abused: No    Physically abused: No    Forced sexual activity: No  Other Topics Concern  . Not on file  Social History Narrative  . Not on file     Current Outpatient Medications:  .  Cholecalciferol (VITAMIN D3) 50 MCG (2000 UT) TABS, Take 1 tablet by mouth daily., Disp: 100 tablet, Rfl: 1 .  Cyanocobalamin (VITAMIN B-12) 1000 MCG SUBL, PLACE 1 TABLET UNDER THE TONGUE DAILY, Disp: 100 each, Rfl: 0 .  losartan (COZAAR) 100 MG tablet, Take 1 tablet (100 mg total) by mouth daily., Disp: 90 tablet, Rfl: 1  No Known Allergies   ROS  Constitutional: Negative for fever , positive for weight change.  Respiratory: Mild for cough dry but no  shortness of breath.   Cardiovascular: Negative for chest pain or palpitations.  Gastrointestinal: Negative for abdominal pain, no bowel changes.  Musculoskeletal: Negative for gait problem or joint swelling.  Skin: Negative for rash.  Neurological: Negative for dizziness or headache.  No other specific complaints in a complete review of systems (except as listed in HPI above).  Objective  Vitals:   01/28/19 1150  BP: 126/84  Pulse: 97  Resp: 16  Temp: (!) 97.3 F (36.3 C)  TempSrc: Temporal  SpO2: 99%  Weight: 215 lb 4.8 oz (97.7 kg)  Height: _0  (1.651 m)    Body mass index is 35.83 kg/m.  Physical Exam  Constitutional: Patient appears well-developed and well-nourished. No distress.  HENT: Head: Normocephalic and atraumatic. Ears: B TMs ok, no erythema or effusion; Nose: Nose normal. Mouth/Throat: Oropharynx is clear and moist. No oropharyngeal exudate.  Eyes: Conjunctivae and EOM are normal. Pupils are equal, round, and reactive to light. No scleral icterus.  Neck: Normal range of motion. Neck supple. No JVD present. No thyromegaly present.  Cardiovascular: Normal rate, regular rhythm and normal heart sounds.  No murmur heard. No BLE edema. Pulmonary/Chest:  Effort normal and breath sounds normal. No respiratory distress. Abdominal: Soft. Bowel sounds are normal, no distension. There is no tenderness. no masses Breast: no lumps or masses, no nipple discharge or rashes FEMALE GENITALIA:  External genitalia large areas of scarring on both labia, worse on left side External urethra normal Vaginal vault normal some frothy discharge noticed Cervix normal without discharge or lesions Bimanual exam fullness on left lower quadrant, patient not sure if oophorectomy and agrees on having  RECTAL:not done  Musculoskeletal: Normal range of motion, no joint effusions. No gross deformities Neurological: he is alert and oriented to person, place, and time. No cranial nerve deficit. Coordination, balance, strength, speech and gait are normal.  Skin: multiple lesions on both axilla, worse on left side with oozing and erythema  Psychiatric: Patient has a normal mood and affect. behavior is normal. Judgment and thought content normal.  Recent Results (from the past 2160 hour(s))  Cologuard     Status: None   Collection Time: 11/11/18 12:00 AM  Result Value Ref Range   Cologuard Negative Negative      PHQ2/9: Depression screen [facility] 2/9 01/28/2019 01/20/2019 10/14/2018 10/07/2017 01/07/2017  Decreased Interest 0 0 0 0 0  Down, Depressed, Hopeless 0 0 0 0 0  PHQ - 2 Score 0 0 0 0 0  Altered sleeping 0 0 0 - -  Tired, decreased energy 0 0 0 - -  Change in appetite 1 0 0 - -  Feeling bad or failure about yourself  0 0 0 - -  Trouble concentrating 1 0 0 - -  Moving slowly or fidgety/restless 0 0 0 - -  Suicidal thoughts 0 0 0 - -  PHQ-9 Score 2  0 0 - -  Difficult doing work/chores Not difficult at all Not difficult at all - - -     Fall Risk: Fall Risk  01/28/2019 01/20/2019 10/14/2018 10/14/2018 10/07/2017  Falls in the past year? 0 0 0 0 No  Number falls in past yr: 0 0 0 0 -  Injury with Fall? 0 0 0 0 -     Assessment & Plan   1. Cervical cancer  screening  - Cytology - PAP  2. Well woman exam  - Cytology - PAP  3. Pelvic fullness in female  - US PELVIS (TRANSABDOMINAL ONLY); Future  4. Hidradenitis suppurativa  - doxycycline (VIBRA-TABS) 100 MG tablet; Take 1 tablet (100 mg total) by mouth 2 (two) times daily.  Dispense: 20 tablet; Refill: 0   -USPSTF grade A and B recommendations reviewed with patient; age-appropriate recommendations, preventive care, screening tests, etc discussed and encouraged; healthy living encouraged; see AVS for patient education given to patient -Discussed importance of 150 minutes of physical activity weekly, eat two servings of fish weekly, eat one serving of tree nuts ( cashews, pistachios, pecans, almonds.Marland Kitchen) every other day, eat 6 servings of fruit/vegetables daily and drink plenty of water and avoid sweet beverages.

## 2019-01-28 NOTE — Patient Instructions (Signed)

## 2019-01-29 LAB — CYTOLOGY - PAP
Chlamydia: NEGATIVE
Diagnosis: NEGATIVE
HPV: NOT DETECTED
Neisseria Gonorrhea: NEGATIVE
Trichomonas: NEGATIVE

## 2019-01-29 NOTE — Addendum Note (Signed)
Addended by: Saunders Glance A on: 01/29/2019 01:12 PM   Modules accepted: Orders

## 2019-02-03 NOTE — Addendum Note (Signed)
Addended by: Saunders Glance A on: 02/03/2019 12:07 PM   Modules accepted: Orders

## 2019-02-06 ENCOUNTER — Telehealth: Payer: Self-pay

## 2019-02-06 NOTE — Telephone Encounter (Signed)
Called patient to inform her that she has been scheduled to have her Ultrasound on 02/13/2019 @ 4pm at the [facility], but there was no answer. A voicemail was left with that information on there as well as instructions to start drinking32 oz of water at 3pm but be finished by 3:30pm without using the restroom and to arrive at 3:45pm.   CRM will be placed.

## 2019-02-13 ENCOUNTER — Ambulatory Visit
Admission: RE | Admit: 2019-02-13 | Discharge: 2019-02-13 | Disposition: A | Discharge status: Home / Self Care | Payer: BC Managed Care – PPO | Source: Ambulatory Visit | Attending: Family Medicine | Admitting: Family Medicine

## 2019-02-13 ENCOUNTER — Other Ambulatory Visit: Payer: Self-pay

## 2019-02-13 DIAGNOSIS — R1900 Intra-abdominal and pelvic swelling, mass and lump, unspecified site: Secondary | ICD-10-CM | POA: Diagnosis not present

## 2019-04-17 ENCOUNTER — Ambulatory Visit: Payer: BC Managed Care – PPO | Admitting: Family Medicine

## 2019-04-23 ENCOUNTER — Other Ambulatory Visit: Payer: Self-pay | Admitting: Family Medicine

## 2019-04-23 DIAGNOSIS — E538 Deficiency of other specified B group vitamins: Secondary | ICD-10-CM

## 2019-04-23 NOTE — Telephone Encounter (Signed)
Requested medication (s) are due for refill today: yes  Requested medication (s) are on the active medication list: yes  Last refill:  10/29/2018  Future visit scheduled: yes  Notes to clinic:  Review for refill   Requested Prescriptions  Pending Prescriptions Disp Refills   Cyanocobalamin (B-12) 1000 MCG LOZG [Pharmacy Med Name: B-12 1000 MCG Oral Lozenge]  0    Sig: DISSOLVE ONE TABLET UNDER THE TONGUE DAILY     Off-Protocol Failed - 04/23/2019  1:03 PM      Failed - Medication not assigned to a protocol, review manually.      Passed - Valid encounter within last 12 months    Recent Outpatient Visits          2 months ago Cervical cancer screening   [facility] Steele Sizer, MD   3 months ago Exposure to [place], NP   6 months ago Pre-diabetes   [facility] Steele Sizer, MD   1 year ago Hypertension, benign   [facility] Steele Sizer, MD   1 year ago Hypertension, benign   [facility] Steele Sizer, MD      Future Appointments            In 1 week Steele Sizer, MD [facility], [facility]

## 2019-04-28 ENCOUNTER — Other Ambulatory Visit: Payer: Self-pay | Admitting: Family Medicine

## 2019-04-28 DIAGNOSIS — I10 Essential (primary) hypertension: Secondary | ICD-10-CM

## 2019-05-01 ENCOUNTER — Encounter: Payer: Self-pay | Admitting: Family Medicine

## 2019-05-01 ENCOUNTER — Ambulatory Visit (INDEPENDENT_AMBULATORY_CARE_PROVIDER_SITE_OTHER): Payer: BC Managed Care – PPO | Admitting: Family Medicine

## 2019-05-01 ENCOUNTER — Other Ambulatory Visit: Payer: Self-pay

## 2019-05-01 DIAGNOSIS — L732 Hidradenitis suppurativa: Secondary | ICD-10-CM

## 2019-05-01 DIAGNOSIS — M79602 Pain in left arm: Secondary | ICD-10-CM | POA: Diagnosis not present

## 2019-05-01 DIAGNOSIS — E8881 Metabolic syndrome: Secondary | ICD-10-CM | POA: Diagnosis not present

## 2019-05-01 DIAGNOSIS — I10 Essential (primary) hypertension: Secondary | ICD-10-CM | POA: Diagnosis not present

## 2019-05-01 MED ORDER — LOSARTAN POTASSIUM 100 MG PO TABS: 100.0000 mg | ORAL_TABLET | Freq: Every day | ORAL | 1 refills | Status: DC

## 2019-05-01 NOTE — Progress Notes (Signed)
Name: Sara Foley   MRN: [id number]    DOB: [birth year]   Date:05/01/2019       Progress Note  Subjective  Chief Complaint  Chief Complaint  Patient presents with  . Arm Problem    left arm soreness, thinks it is coming from her being on the computer and in the same position.  . Medication Refill  . Hypertension    I connected with  Bennye Alm  on 05/01/19 at  3:40 PM EDT by a video enabled telemedicine application and verified that I am speaking with the correct person using two identifiers.  I discussed the limitations of evaluation and management by telemedicine and the availability of in person appointments. The patient expressed understanding and agreed to proceed. Staff also discussed with the patient that there may be a patient responsible charge related to this service. Patient Location: at work  Provider Location: [facility]    HPI  HTN: bp was well controlled on losartan, however she has been out for the past week, she called pharmacy but never got a reply. Explained that I usually fill rx when there is an appointment on the schedule. We will resume medication today. 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 She has noticed pain on biceps area, pain is described as soreness on the area and only with certain movements , no rashes, not associated with chest pain or palpitation, try using topical medication, such as biofreze   Hydradenitis suppurative: she is considering contacting her surgeon, she states sitting is making it worse, she took antibiotics 3 months ago   Metabolic Syndrome: she does not have a scale at home, she is trying to eat healthier. Denies polyphagia, polydipsia or polyuria  Patient Active Problem List   Diagnosis Date Noted  . Metabolic syndrome 02/28/2016  . Low HDL (under 40) 02/28/2016  . History of  hysterectomy leaving cervix intact 01/19/2016  . Vitamin D deficiency 01/18/2016  . Vitamin B12 deficiency 01/18/2016  . Obesity (BMI 30.0-34.9) 01/17/2016  . History of gestational diabetes 01/17/2016  . Hypertension, benign 01/17/2016  . Hidradenitis suppurativa 01/17/2016  . History of iron deficiency anemia 01/17/2016  . Neck pain 01/17/2016    Past Surgical History:  Procedure Laterality Date  . ABDOMINAL HYSTERECTOMY  2012   partial  . BREAST BIOPSY Left 04-14-12   left breast stereotactic biopsy; fibroadenoma  . BREAST REDUCTION SURGERY  1999  . PARTIAL HYSTERECTOMY  July 2013   due to fibroid tumors  . REDUCTION MAMMAPLASTY Bilateral 2000    Family History  Problem Relation Age of Onset  . Prostate cancer Other   . Colon cancer Maternal Uncle   . Diabetes Mother   . Arthritis/Rheumatoid Mother   . Leukemia Father     Social History   Socioeconomic History  . Marital status: Divorced    Spouse name: Not on file  . Number of children: 3  . Years of education: Not on file  . Highest education level: Not on file  Occupational History  . Not on file  Social Needs  . Financial resource strain: Somewhat hard  . Food insecurity    Worry: Never true    Inability: Never true  . Transportation needs    Medical: No    Non-medical: No  Tobacco Use  . Smoking status: Never  Smoker  . Smokeless tobacco: Never Used  Substance and Sexual Activity  . Alcohol use: Yes    Alcohol/week: 3.0 standard drinks    Types: 3 Standard drinks or equivalent per week  . Drug use: No  . Sexual activity: Not Currently  Lifestyle  . Physical activity    Days per week: 5 days    Minutes per session: 10 min  . Stress: Not at all  Relationships  . Social connections    Talks on phone: More than three times a week    Gets together: More than three times a week    Attends religious service: More than 4 times per year    Active member of club or organization: Yes    Attends  meetings of clubs or organizations: More than 4 times per year    Relationship status: Divorced  . Intimate partner violence    Fear of current or ex partner: No    Emotionally abused: No    Physically abused: No    Forced sexual activity: No  Other Topics Concern  . Not on file  Social History Narrative  . Not on file     Current Outpatient Medications:  .  Cholecalciferol (VITAMIN D3) 50 MCG (2000 UT) TABS, Take 1 tablet by mouth daily., Disp: 100 tablet, Rfl: 1 .  Cyanocobalamin (B-12) 1000 MCG LOZG, DISSOLVE ONE TABLET UNDER THE TONGUE DAILY, Disp: 100 lozenge, Rfl: 0 .  losartan (COZAAR) 100 MG tablet, Take 1 tablet (100 mg total) by mouth daily., Disp: 90 tablet, Rfl: 1 .  doxycycline (VIBRA-TABS) 100 MG tablet, Take 1 tablet (100 mg total) by mouth 2 (two) times daily. (Patient not taking: Reported on 05/01/2019), Disp: 20 tablet, Rfl: 0  No Known Allergies  I personally reviewed active problem list, medication list, allergies, family history, social history, health maintenance with the patient/caregiver today.   ROS  Ten systems reviewed and is negative except as mentioned in HPI   Objective  Virtual encounter, vitals not obtained.  There is no height or weight on file to calculate BMI.  Physical Exam  Awake, alert and oriented   PHQ2/9: Depression screen [facility] 2/9 05/01/2019 01/28/2019 01/20/2019 10/14/2018 10/07/2017  Decreased Interest 0 0 0 0 0  Down, Depressed, Hopeless 0 0 0 0 0  PHQ - 2 Score 0 0 0 0 0  Altered sleeping 0 0 0 0 -  Tired, decreased energy 0 0 0 0 -  Change in appetite 0 1 0 0 -  Feeling bad or failure about yourself  0 0 0 0 -  Trouble concentrating 0 1 0 0 -  Moving slowly or fidgety/restless 0 0 0 0 -  Suicidal thoughts 0 0 0 0 -  PHQ-9 Score 0 2 0 0 -  Difficult doing work/chores - Not difficult at all Not difficult at all - -   PHQ-2/9 Result is negative.    Fall Risk: Fall Risk  05/01/2019 01/28/2019 01/20/2019 10/14/2018 10/14/2018   Falls in the past year? 0 0 0 0 0  Number falls in past yr: 0 0 0 0 0  Injury with Fall? 0 0 0 0 0     Assessment & Plan  1. Hypertension, benign  - losartan (COZAAR) 100 MG tablet; Take 1 tablet (100 mg total) by mouth daily.  Dispense: 90 tablet; Refill: 1  2. Metabolic syndrome  Stable   3. Hidradenitis suppurativa  She is trying to see surgeon   4. Left arm  pain  Topical medication for arm and ice  I discussed the assessment and treatment plan with the patient. The patient was provided an opportunity to ask questions and all were answered. The patient agreed with the plan and demonstrated an understanding of the instructions.  The patient was advised to call back or seek an in-person evaluation if the symptoms worsen or if the condition fails to improve as anticipated.  I provided 25  minutes of non-face-to-face time during this encounter.

## 2019-05-19 ENCOUNTER — Encounter: Payer: Self-pay | Admitting: General Surgery

## 2019-05-19 ENCOUNTER — Ambulatory Visit: Payer: BC Managed Care – PPO | Admitting: General Surgery

## 2019-05-19 ENCOUNTER — Other Ambulatory Visit: Payer: Self-pay

## 2019-05-19 VITALS — BP 137/93 | HR 111 | Temp 97.9°F | Ht 65.0 in | Wt 223.2 lb

## 2019-05-19 DIAGNOSIS — L732 Hidradenitis suppurativa: Secondary | ICD-10-CM | POA: Diagnosis not present

## 2019-05-19 MED ORDER — AMOXICILLIN-POT CLAVULANATE 875-125 MG PO TABS: 1.0000 | ORAL_TABLET | Freq: Two times a day (BID) | ORAL | 0 refills | Status: AC

## 2019-05-19 NOTE — Patient Instructions (Addendum)
We have prescribed you antibiotics. Please complete the full course.                                                        Our surgery scheduler will call you to set up surgery.    Hidradenitis Suppurativa Hidradenitis suppurativa is a long-term (chronic) skin disease. It is similar to a severe form of acne, but it affects areas of the body where acne would be unusual, especially areas of the body where skin rubs against skin and becomes moist. These include:  Underarms.  Groin.  Genital area.  Buttocks.  Upper thighs.  Breasts. Hidradenitis suppurativa may start out as small lumps or pimples caused by blocked sweat glands or hair follicles. Pimples may develop into deep sores that break open (rupture) and drain pus. Over time, affected areas of skin may thicken and become scarred. This condition is rare and does not spread from person to person (non-contagious). What are the causes? The exact cause of this condition is not known. It may be related to:  Female and female hormones.  An overactive disease-fighting system (immune system). The immune system may over-react to blocked hair follicles or sweat glands and cause swelling and pus-filled sores. What increases the risk? You are more likely to develop this condition if you:  Are female.  Are 80-42 years old.  Have a family history of hidradenitis suppurativa.  Have a personal history of acne.  Are overweight.  Smoke.  Take the medicine lithium. What are the signs or symptoms? The first symptoms are usually painful bumps in the skin, similar to pimples. The condition may get worse over time (progress), or it may only cause mild symptoms. If the disease progresses, symptoms may include:  Skin bumps getting bigger and growing deeper into the skin.  Bumps rupturing and draining pus.  Itchy, infected skin.  Skin getting thicker and scarred.  Tunnels under the skin (fistulas) where pus  drains from a bump.  Pain during daily activities, such as pain during walking if your groin area is affected.  Emotional problems, such as stress or depression. This condition may affect your appearance and your ability or willingness to wear certain clothes or do certain activities. How is this diagnosed? This condition is diagnosed by a health care provider who specializes in skin diseases (dermatologist). You may be diagnosed based on:  Your symptoms and medical history.  A physical exam.  Testing a pus sample for infection.  Blood tests. How is this treated? Your treatment will depend on how severe your symptoms are. The same treatment will not work for everybody with this condition. You may need to try several treatments to find what works best for you. Treatment may include:  Cleaning and bandaging (dressing) your wounds as needed.  Lifestyle changes, such as new skin care routines.  Taking medicines, such as: ? Antibiotics. ? Acne medicines. ? Medicines to reduce the activity of the immune system. ? A diabetes medicine (metformin). ? Birth control pills, for women. ? Steroids to reduce swelling and pain.  Working with a mental health care provider, if you experience emotional distress due to this condition. If you have severe symptoms that do not get better with medicine, you may need surgery. Surgery may involve:  Using a laser to clear the skin and  remove hair follicles.  Opening and draining deep sores.  Removing the areas of skin that are diseased and scarred. Follow these instructions at home: Medicines   Take over-the-counter and prescription medicines only as told by your health care provider.  If you were prescribed an antibiotic medicine, take it as told by your health care provider. Do not stop taking the antibiotic even if your condition improves. Skin care  If you have open wounds, cover them with a clean dressing as told by your health care  provider. Keep wounds clean by washing them gently with soap and water when you bathe.  Do not shave the areas where you get hidradenitis suppurativa.  Do not wear deodorant.  Wear loose-fitting clothes.  Try to avoid getting overheated or sweaty. If you get sweaty or wet, change into clean, dry clothes as soon as you can.  To help relieve pain and itchiness, cover sore areas with a warm, clean washcloth (warm compress) for 5-10 minutes as often as needed.  If told by your health care provider, take a bleach bath twice a week: ? Fill your bathtub halfway with water. ? Pour in  cup of unscented household bleach. ? Soak in the tub for 5-10 minutes. ? Only soak from the neck down. Avoid water on your face and hair. ? Shower to rinse off the bleach from your skin. General instructions  Learn as much as you can about your disease so that you have an active role in your treatment. Work closely with your health care provider to find treatments that work for you.  If you are overweight, work with your health care provider to lose weight as recommended.  Do not use any products that contain nicotine or tobacco, such as cigarettes and e-cigarettes. If you need help quitting, ask your health care provider.  If you struggle with living with this condition, talk with your health care provider or work with a mental health care provider as recommended.  Keep all follow-up visits as told by your health care provider. This is important. Where to find more information  Hidradenitis Suppurativa [employer].: https://www.hs-foundation.org/ Contact a health care provider if you have:  A flare-up of hidradenitis suppurativa.  A fever or chills.  Trouble controlling your symptoms at home.  Trouble doing your daily activities because of your symptoms.  Trouble dealing with emotional problems related to your condition. Summary  Hidradenitis suppurativa is a long-term (chronic) skin disease.  It is similar to a severe form of acne, but it affects areas of the body where acne would be unusual.  The first symptoms are usually painful bumps in the skin, similar to pimples. The condition may get worse over time (progress), or it may only cause mild symptoms.  If you have open wounds, cover them with a clean dressing as told by your health care provider. Keep wounds clean by washing them gently with soap and water when you bathe.  Besides skin care, treatment may include medicines, laser treatment, and surgery. This information is not intended to replace advice given to you by your health care provider. Make sure you discuss any questions you have with your health care provider. Document Released: 02/28/2004 Document Revised: 07/24/2017 Document Reviewed: 07/24/2017 Elsevier Patient Education  2020 [employer]   Negative Pressure Wound Therapy Home Guide Negative pressure wound therapy (NPWT) uses a sponge or foam-like material (dressing) placed on or inside the wound. The wound is then covered and sealed with a cover dressing that  sticks to your skin (is adhesive). This keeps air out. A tube is attached to the cover dressing, and this tube connects to a small pump. The pump sucks fluid and germs from the wound. NPWT helps to increase blood flow to the wound and heal it from the inside. What are the risks? NPWT is usually safe to use. However, problems can occur, including:  Skin irritation from the dressing adhesive.  Bleeding.  Infection.  Dehydration. Wounds with large amounts of drainage can cause excessive fluid loss.  Pain. Supplies needed:  A disposable garbage bag.  Soap and water, or hand sanitizer.  Wound cleanser or salt-water solution (saline).  New sponge and cover dressing.  Protective clothing.  Gauze pad.  Vinyl gloves.  Tape.  Skin protectant. This may be a wipe, film, or spray.  Clean or germ-free (sterile) scissors.  Eye protection. How to  change your dressing Prepare to change your dressing  1. If told by your health care provider, take pain medicine 30 minutes before changing the dressing. 2. Wash your hands with soap and water. Dry your hands with a clean towel. If soap and water are not available, use hand sanitizer. 3. Set up a clean station for wound care. 4. Open the dressing package so that the sponge dressing remains on the inside of the package. 5. Wear gloves, protective clothing, and eye protection. Remove old dressing  1. Turn off the pump and disconnect the tubing from the dressing. 2. Carefully remove the adhesive cover dressing in the direction of your hair growth. 3. Remove the sponge dressing that is inside the wound. If the sponge sticks, use a wound cleanser or saline solution to wet the sponge and help it come off more easily. 4. Throw the old sponge and cover dressing supplies into the garbage bag. 5. Remove your gloves by grabbing the cuff and turning the glove inside out. Place the gloves in the trash immediately. 6. Wash your hands with soap and water. Dry your hands with a clean towel. If soap and water are not available, use hand sanitizer. Clean your wound  Wear gloves, protective clothing, and eye protection. Follow your health care provider's instructions on how to clean your wound. You may be told to: 1. Clean the wound using a saline solution or a wound cleanser and a clean gauze pad. 2. Pat the wound dry with a gauze pad. Do not rub the wound. 3. Throw the gauze pad into the garbage bag. 4. Remove your gloves by grabbing the cuff and turning the glove inside out. Place the gloves in the trash immediately. 5. Wash your hands with soap and water. Dry your hands with a clean towel. If soap and water are not available, use hand sanitizer. Apply new dressing  Wear gloves, protective clothing, and eye protection. 1. If told by your health care provider, apply a skin protectant to any skin that  will be exposed to adhesive. Let the skin protectant dry. 2. Cut a piece of new sponge dressing and put it on or in the wound. 3. Using clean scissors, cut a nickel-sized hole in the new cover dressing. 4. Apply the cover dressing. 5. Attach the suction tube over the hole in the cover dressing. 6. Take off your gloves. Put them in the plastic bag with the old dressing. Tie the bag shut and throw it away. 7. Wash your hands with soap and water. Dry your hands with a clean towel. If soap and water are not  available, use hand sanitizer. 8. Turn the pump back on. The sponge dressing should collapse. Do not change the settings on the machine without talking to a health care provider. 9. Replace the container in the pump that collects fluid if it is full. Replace the container per the manufacturer's instructions or at least once a week, even if it is not full. General tips and recommendations If the alarm sounds:  Stay calm.  Do not turn off the pump or do anything with the dressing.  Reasons the alarm may go off: ? The battery is low. Change the battery or plug the device into electrical power. ? The dressing has a leak. Find the leak and put tape over the leak. ? The fluid collection container is full. Change the fluid container.  Call your health care provider right away if you cannot fix the problem.  Explain to your health care provider what is happening. Follow his or her instructions. General instructions  Do not turn off the pump unless told to do so by your health care provider.  Do not turn off the pump for more than 2 hours. If the pump is off for more than 2 hours, the dressing will need to be changed.  If your health care provider says it is okay to shower: ? Do not take the pump into the shower. ? Make sure the wound dressing is protected and sealed. The wound dressing must stay dry.  Check frequently that the machine indicates that therapy is on and that all clamps are  open.  Do not use over-the-counter medicated or antiseptic creams, sprays, liquids, or dressings unless your health care provider approves. Contact a health care provider if:  You have new pain.  You develop irritation, a rash, or itching around the wound or dressing.  You see new black or yellow tissue in your wound.  The dressing changes are painful or cause bleeding.  The pump has been off for more than 2 hours, and you do not know how to change the dressing.  The pump alarm goes off, and you do not know what to do. Get help right away if:  You have a lot of bleeding.  The wound breaks open.  You have severe pain.  You have signs of infection, such as: ? More redness, swelling, or pain. ? More fluid or blood. ? Warmth. ? Pus or a bad smell. ? Red streaks leading from the wound. ? A fever.  You see a sudden change in the color or texture of the drainage.  You have signs of dehydration, such as: ? Little or no tears, urine, or sweat. ? Muscle cramps. ? Very dry mouth. ? Headache. ? Dizziness. Summary  Negative pressure wound therapy (NPWT) is a device that helps your wound heal.  Set up a clean station for wound care. Your health care provider will tell you what supplies to use.  Follow your health care provider's instructions on how to clean your wound and how to change the dressing.  Contact a health care provider if you have new pain, an irritation, or a rash, or if the alarm goes off and you do not know what to do.  Get help right away if you have a lot of bleeding, your wound breaks open, or you have severe pain. Also, get help if you have signs of infection. This information is not intended to replace advice given to you by your health care provider. Make sure you discuss any  questions you have with your health care provider. Document Released: 10/08/2011 Document Revised: 11/07/2018 Document Reviewed: 10/03/2018 Elsevier Patient Education  2020 [employer].

## 2019-05-21 NOTE — Progress Notes (Signed)
Patient ID: Sara Foley, female   DOB: Sep 17, 1965, 53 y.o.   MRN: [id number]  Chief Complaint  Patient presents with  . New Patient (Initial Visit)    Hidradenitis    HPI Sara Foley is a 53 y.o. female.   She has been seen before in this clinic for similar problem, most recently 3 years ago.  She states that she has had flares of hidradenitis for years.  This has been axillary inguinal and inframammary as well as on her thighs.  She has never had surgery for this; when she has had flares, she either takes antibiotics with resolution or they spontaneously resolve on her own.  She states that the lesions on her medial left thigh have progressively gotten worse and more painful.  She says that at the end of August, the area began getting larger and more inflamed.  She has noticed some drainage from the site.  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  She has not had any fevers or chills.  No nausea or vomiting.  The pain is localized to the left medial thigh and comes and jolts.  It does not radiate anywhere.  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   Past Medical History:  Diagnosis Date  . Abnormal mammogram, unspecified 2013   illdefined left inferior density; ultrasound was normal  . Breast screening, unspecified   . Gestational diabetes    1996  . Hidradenitis 2013   hidradentitis suppurativa  . Obesity, unspecified   . Screening for obesity   . Unspecified essential hypertension     Past Surgical History:  Procedure Laterality Date  . ABDOMINAL HYSTERECTOMY  2012   partial  . BREAST BIOPSY Left 04-14-12   left breast stereotactic biopsy; fibroadenoma  . BREAST REDUCTION SURGERY  1999  . PARTIAL HYSTERECTOMY  July 2013   due to fibroid tumors  . REDUCTION MAMMAPLASTY Bilateral 2000    Family History  Problem Relation Age of Onset  . Prostate cancer Other   .  Colon cancer Maternal Uncle   . Diabetes Mother   . Arthritis/Rheumatoid Mother   . Leukemia Father     Social History Social History   Tobacco Use  . Smoking status: Never Smoker  . Smokeless tobacco: Never Used  Substance Use Topics  . Alcohol use: Yes    Alcohol/week: 3.0 standard drinks    Types: 3 Standard drinks or equivalent per week  . Drug use: No    No Known Allergies  Current Outpatient Medications  Medication Sig Dispense Refill  . Cholecalciferol (VITAMIN D3) 50 MCG (2000 UT) TABS Take 1 tablet by mouth daily. 100 tablet 1  . Cyanocobalamin (B-12) 1000 MCG LOZG DISSOLVE ONE TABLET UNDER THE TONGUE DAILY 100 lozenge 0  . losartan (COZAAR) 100 MG tablet Take 1 tablet (100 mg total) by mouth daily. 90 tablet 1  . amoxicillin-clavulanate (AUGMENTIN) 875-125 MG tablet Take 1 tablet by mouth 2 (two) times daily for 14 days. 28 tablet 0   No current facility-administered medications for this visit.     Review of Systems Review of Systems  All other systems reviewed and are negative. Or as discussed above.  Blood pressure (!) 137/93, pulse (!) 111, temperature 97.9 F (36.6 C), height 5\' 5"  (1.651 m), weight 223 lb 3.2 oz (101.2 kg), SpO2 97 %. Body mass index is 37.14 kg/m.  Physical Exam Physical Exam Constitutional:      General: She is not in acute distress.    Appearance: Normal appearance. She is obese.  HENT:     Head: Normocephalic and atraumatic.     Nose:     Comments: Covered with a mask secondary to COVID-19 precautions    Mouth/Throat:     Comments: Covered with a mask secondary to COVID-19 precautions Eyes:     General: No scleral icterus.       Right eye: No discharge.        Left eye: No discharge.     Conjunctiva/sclera: Conjunctivae normal.  Neck:     Musculoskeletal: Normal range of motion. No neck rigidity.  Cardiovascular:     Rate and Rhythm: Normal rate and regular rhythm.     Pulses: Normal pulses.  Pulmonary:     Effort:  Pulmonary effort is normal.     Breath sounds: Normal breath sounds.  Abdominal:     General: Bowel sounds are normal.     Palpations: Abdomen is soft.     Comments: Protuberant, consistent with her level of obesity.  Genitourinary:    Comments: Deferred Musculoskeletal:     Right lower leg: No edema.     Left lower leg: No edema.  Lymphadenopathy:     Cervical: No cervical adenopathy.  Skin:    Findings: Lesion present.          Comments: In the left inner thigh, there is an area of thickening and induration.  No erythema is appreciated, but this may be secondary to patient's skin tone.  The skin is fibrotic and there is some purulent drainage present.  Neurological:     General: No focal deficit present.     Mental Status: She is alert and oriented to person, place, and time.  Psychiatric:        Mood and Affect: Mood normal.        Behavior: Behavior normal.        Thought Content: Thought content normal.     Data Reviewed General surgery clinic notes dating from May 11, 2014, June 01, 2014, and February 06, 2016 were reviewed.  These notes discuss a lesion in the same area as the current site of concern.  It appears that surgical resection was never discussed with her; she was prescribed antibiotics on several occasions.  At her very last visit, the assessment does state that she may benefit with excision but the patient states that she was not aware that surgical resection had been an option.  Assessment This is a 53 year old woman with a long history of hidradenitis.  She has recurrent and more frequent flares in her left inner thigh area.  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  She is interested in treatment.  Plan I discussed with her today that the most definitive treatment for hidradenitis that has been persistently flaring for many years is to surgically resect the area.  This would be a wide local excision which would  require either dressing changes or application of a wound VAC.  She was somewhat surprised by this recommendation, but would like to have the area resolved.  I cautioned her that hidradenitis is a fairly systemic problem and that she could always get recurrence but this would be the best way to treat the site of concern.  Weight loss and local hygiene can certainly decrease the risk of recurrence.  She did express interest in proceeding with operative intervention, but wanted some additional time to think about it.  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  For now, I gave her a prescription for 14 days of oral antibiotics.  We will have our surgery scheduler contact her and look for available options for a surgery date.    Fredirick Maudlin 05/21/2019, 9:29 AM

## 2019-05-21 NOTE — H&P (View-Only) (Signed)
Patient ID: Sara Foley, female   DOB: Sep 17, 1965, 53 y.o.   MRN: [id number]  Chief Complaint  Patient presents with  . New Patient (Initial Visit)    Hidradenitis    HPI Sara Foley is a 53 y.o. female.   She has been seen before in this clinic for similar problem, most recently 3 years ago.  She states that she has had flares of hidradenitis for years.  This has been axillary inguinal and inframammary as well as on her thighs.  She has never had surgery for this; when she has had flares, she either takes antibiotics with resolution or they spontaneously resolve on her own.  She states that the lesions on her medial left thigh have progressively gotten worse and more painful.  She says that at the end of August, the area began getting larger and more inflamed.  She has noticed some drainage from the site.  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  She has not had any fevers or chills.  No nausea or vomiting.  The pain is localized to the left medial thigh and comes and jolts.  It does not radiate anywhere.  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   Past Medical History:  Diagnosis Date  . Abnormal mammogram, unspecified 2013   illdefined left inferior density; ultrasound was normal  . Breast screening, unspecified   . Gestational diabetes    1996  . Hidradenitis 2013   hidradentitis suppurativa  . Obesity, unspecified   . Screening for obesity   . Unspecified essential hypertension     Past Surgical History:  Procedure Laterality Date  . ABDOMINAL HYSTERECTOMY  2012   partial  . BREAST BIOPSY Left 04-14-12   left breast stereotactic biopsy; fibroadenoma  . BREAST REDUCTION SURGERY  1999  . PARTIAL HYSTERECTOMY  July 2013   due to fibroid tumors  . REDUCTION MAMMAPLASTY Bilateral 2000    Family History  Problem Relation Age of Onset  . Prostate cancer Other   .  Colon cancer Maternal Uncle   . Diabetes Mother   . Arthritis/Rheumatoid Mother   . Leukemia Father     Social History Social History   Tobacco Use  . Smoking status: Never Smoker  . Smokeless tobacco: Never Used  Substance Use Topics  . Alcohol use: Yes    Alcohol/week: 3.0 standard drinks    Types: 3 Standard drinks or equivalent per week  . Drug use: No    No Known Allergies  Current Outpatient Medications  Medication Sig Dispense Refill  . Cholecalciferol (VITAMIN D3) 50 MCG (2000 UT) TABS Take 1 tablet by mouth daily. 100 tablet 1  . Cyanocobalamin (B-12) 1000 MCG LOZG DISSOLVE ONE TABLET UNDER THE TONGUE DAILY 100 lozenge 0  . losartan (COZAAR) 100 MG tablet Take 1 tablet (100 mg total) by mouth daily. 90 tablet 1  . amoxicillin-clavulanate (AUGMENTIN) 875-125 MG tablet Take 1 tablet by mouth 2 (two) times daily for 14 days. 28 tablet 0   No current facility-administered medications for this visit.     Review of Systems Review of Systems  All other systems reviewed and are negative. Or as discussed above.  Blood pressure (!) 137/93, pulse (!) 111, temperature 97.9 F (36.6 C), height 5\' 5"  (1.651 m), weight 223 lb 3.2 oz (101.2 kg), SpO2 97 %. Body mass index is 37.14 kg/m.  Physical Exam Physical Exam Constitutional:      General: She is not in acute distress.    Appearance: Normal appearance. She is obese.  HENT:     Head: Normocephalic and atraumatic.     Nose:     Comments: Covered with a mask secondary to COVID-19 precautions    Mouth/Throat:     Comments: Covered with a mask secondary to COVID-19 precautions Eyes:     General: No scleral icterus.       Right eye: No discharge.        Left eye: No discharge.     Conjunctiva/sclera: Conjunctivae normal.  Neck:     Musculoskeletal: Normal range of motion. No neck rigidity.  Cardiovascular:     Rate and Rhythm: Normal rate and regular rhythm.     Pulses: Normal pulses.  Pulmonary:     Effort:  Pulmonary effort is normal.     Breath sounds: Normal breath sounds.  Abdominal:     General: Bowel sounds are normal.     Palpations: Abdomen is soft.     Comments: Protuberant, consistent with her level of obesity.  Genitourinary:    Comments: Deferred Musculoskeletal:     Right lower leg: No edema.     Left lower leg: No edema.  Lymphadenopathy:     Cervical: No cervical adenopathy.  Skin:    Findings: Lesion present.          Comments: In the left inner thigh, there is an area of thickening and induration.  No erythema is appreciated, but this may be secondary to patient's skin tone.  The skin is fibrotic and there is some purulent drainage present.  Neurological:     General: No focal deficit present.     Mental Status: She is alert and oriented to person, place, and time.  Psychiatric:        Mood and Affect: Mood normal.        Behavior: Behavior normal.        Thought Content: Thought content normal.     Data Reviewed General surgery clinic notes dating from May 11, 2014, June 01, 2014, and February 06, 2016 were reviewed.  These notes discuss a lesion in the same area as the current site of concern.  It appears that surgical resection was never discussed with her; she was prescribed antibiotics on several occasions.  At her very last visit, the assessment does state that she may benefit with excision but the patient states that she was not aware that surgical resection had been an option.  Assessment This is a 53-year-old woman with a long history of hidradenitis.  She has recurrent and more frequent flares in her left inner thigh area.  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  She is interested in treatment.  Plan I discussed with her today that the most definitive treatment for hidradenitis that has been persistently flaring for many years is to surgically resect the area.  This would be a wide local excision which would  require either dressing changes or application of a wound VAC.  She was somewhat surprised by this recommendation, but would like to have the area resolved.  I cautioned her that hidradenitis is a fairly systemic problem and that she could always get recurrence but this would be the best way to treat the site of concern.  Weight loss and local hygiene can certainly decrease the risk of recurrence.    She did express interest in proceeding with operative intervention, but wanted some additional time to think about it.  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  For now, I gave her a prescription for 14 days of oral antibiotics.  We will have our surgery scheduler contact her and look for available options for a surgery date.    Fredirick Maudlin 05/21/2019, 9:29 AM

## 2019-05-26 ENCOUNTER — Telehealth: Payer: Self-pay

## 2019-05-26 NOTE — Telephone Encounter (Signed)
Pt has been advised of pre admission date/time, Covid Testing date and Surgery date.  Surgery Date: 06/05/19 Preadmission Testing Date: phone Covid Testing Date: 06/02/19 - patient advised to go to the [place] ([place])  [employer] Video sent via [employer] [facility] and [employer].  Patient has been made aware to call [phone], between 1-3:00pm the day before surgery, to find out what time to arrive.

## 2019-05-29 ENCOUNTER — Other Ambulatory Visit
Admission: RE | Admit: 2019-05-29 | Discharge: 2019-05-29 | Disposition: A | Discharge status: Home / Self Care | Payer: BC Managed Care – PPO | Source: Ambulatory Visit | Attending: General Surgery | Admitting: General Surgery

## 2019-05-29 ENCOUNTER — Other Ambulatory Visit: Payer: BC Managed Care – PPO

## 2019-05-29 NOTE — Pre-Procedure Instructions (Signed)
Called office for surgery orders.

## 2019-06-01 ENCOUNTER — Other Ambulatory Visit: Payer: Self-pay | Admitting: General Surgery

## 2019-06-01 DIAGNOSIS — L732 Hidradenitis suppurativa: Secondary | ICD-10-CM

## 2019-06-02 ENCOUNTER — Other Ambulatory Visit: Admission: RE | Admit: 2019-06-02 | Payer: BC Managed Care – PPO | Source: Ambulatory Visit

## 2019-06-02 ENCOUNTER — Encounter
Admission: RE | Admit: 2019-06-02 | Discharge: 2019-06-02 | Disposition: A | Discharge status: Home / Self Care | Payer: BC Managed Care – PPO | Source: Ambulatory Visit | Attending: General Surgery | Admitting: General Surgery

## 2019-06-02 ENCOUNTER — Other Ambulatory Visit: Payer: Self-pay

## 2019-06-02 DIAGNOSIS — Z01812 Encounter for preprocedural laboratory examination: Secondary | ICD-10-CM | POA: Insufficient documentation

## 2019-06-02 DIAGNOSIS — I10 Essential (primary) hypertension: Secondary | ICD-10-CM | POA: Insufficient documentation

## 2019-06-02 DIAGNOSIS — Z20828 Contact with and (suspected) exposure to other viral communicable diseases: Secondary | ICD-10-CM | POA: Insufficient documentation

## 2019-06-02 DIAGNOSIS — Z01810 Encounter for preprocedural cardiovascular examination: Secondary | ICD-10-CM

## 2019-06-02 LAB — SARS CORONAVIRUS 2 (TAT 6-24 HRS): SARS Coronavirus 2: NEGATIVE

## 2019-06-02 NOTE — Patient Instructions (Signed)
Your procedure is scheduled on: 06/05/2019 Fri Report to Same Day Surgery 2nd floor medical mall [facility] Entrance-take elevator on left to 2nd floor.  Check in with surgery information desk.) To find out your arrival time please call [phone] between 1PM - 3PM on 111/11/2018 Thurs  Remember: Instructions that are not followed completely may result in serious medical risk, up to and including death, or upon the discretion of your surgeon and anesthesiologist your surgery may need to be rescheduled.    _x___ 1. Do not eat food after midnight the night before your procedure. You may drink clear liquids up to 2 hours before you are scheduled to arrive at the hospital for your procedure.  Do not drink clear liquids within 2 hours of your scheduled arrival to the hospital.  Clear liquids include  --Water or Apple juice without pulp  --Clear carbohydrate beverage such as ClearFast or Gatorade  --Black Coffee or Clear Tea (No milk, no creamers, do not add anything to                  the coffee or Tea Type 1 and type 2 diabetics should only drink water.   ____Ensure clear carbohydrate drink on the way to the hospital for bariatric patients  ____Ensure clear carbohydrate drink 3 hours before surgery.   No gum chewing or hard candies.     __x__ 2. No Alcohol for 24 hours before or after surgery.   __x__3. No Smoking or e-cigarettes for 24 prior to surgery.  Do not use any chewable tobacco products for at least 6 hour prior to surgery   ____  4. Bring all medications with you on the day of surgery if instructed.    __x__ 5. Notify your doctor if there is any change in your medical condition     (cold, fever, infections).    x___6. On the morning of surgery brush your teeth with toothpaste and water.  You may rinse your mouth with mouth wash if you wish.  Do not swallow any toothpaste or mouthwash.   Do not wear jewelry, make-up, hairpins, clips or nail polish.  Do not wear lotions,  powders, or perfumes. You may wear deodorant.  Do not shave 48 hours prior to surgery. Men may shave face and neck.  Do not bring valuables to the hospital.    [facility] is not responsible for any belongings or valuables.               Contacts, dentures or bridgework may not be worn into surgery.  Leave your suitcase in the car. After surgery it may be brought to your room.  For patients admitted to the hospital, discharge time is determined by your                       treatment team.  _  Patients discharged the day of surgery will not be allowed to drive home.  You will need someone to drive you home and stay with you the night of your procedure.    Please read over the following fact sheets that you were given:   [facility] Preparing for Surgery and or MRSA Information   _x___ Take anti-hypertensive listed below, cardiac, seizure, asthma,     anti-reflux and psychiatric medicines. These include:  1. None  2.  3.  4.  5.  6.  ____Fleets enema or Magnesium Citrate as directed.   _x___ Use CHG Soap or  sage wipes as directed on instruction sheet   ____ Use inhalers on the day of surgery and bring to hospital day of surgery  ____ Stop Metformin and Janumet 2 days prior to surgery.    ____ Take 1/2 of usual insulin dose the night before surgery and none on the morning     surgery.   _x___ Follow recommendations from Cardiologist, Pulmonologist or PCP regarding          stopping Aspirin, Coumadin, Plavix ,Eliquis, Effient, or Pradaxa, and Pletal.  X____Stop Anti-inflammatories such as Advil, Aleve, Ibuprofen, Motrin, Naproxen, Naprosyn, Goodies powders or aspirin products. OK to take Tylenol and                          Celebrex.   _x___ Stop supplements until after surgery.  But may continue Vitamin D, Vitamin B,       and multivitamin.   ____ Bring C-Pap to the hospital.

## 2019-06-03 ENCOUNTER — Telehealth: Payer: Self-pay

## 2019-06-03 NOTE — Telephone Encounter (Signed)
Spoke with [facility] and they will only be able to provide home wound vac changes twice a week. The patient will need to come into the office once a week to have the other change done. She is aware of this and we will schedule these once we know the schedule for [facility]. I spoke with the patient and she said that she does not have any one willing to train to do her dressing changes.  A new order has been faxed to [facility] for twice weekly dressing changes.

## 2019-06-04 ENCOUNTER — Telehealth: Payer: Self-pay

## 2019-06-04 NOTE — Telephone Encounter (Signed)
Spoke with [place] with [employer] and they have delivered the wound vac to the patient and called and instructed her to bring the wound vac and supplies with her to the hospital tomorrow for surgery.

## 2019-06-05 ENCOUNTER — Encounter: Payer: Self-pay | Admitting: *Deleted

## 2019-06-05 ENCOUNTER — Encounter
Admission: RE | Disposition: A | Discharge status: Home / Self Care | Payer: Self-pay | Source: Ambulatory Visit | Attending: General Surgery

## 2019-06-05 ENCOUNTER — Other Ambulatory Visit: Payer: Self-pay

## 2019-06-05 ENCOUNTER — Ambulatory Visit
Admission: RE | Admit: 2019-06-05 | Discharge: 2019-06-05 | Disposition: A | Discharge status: Home / Self Care | Payer: BC Managed Care – PPO | Source: Ambulatory Visit | Attending: General Surgery | Admitting: General Surgery

## 2019-06-05 ENCOUNTER — Ambulatory Visit: Payer: BC Managed Care – PPO | Admitting: Anesthesiology

## 2019-06-05 DIAGNOSIS — E669 Obesity, unspecified: Secondary | ICD-10-CM | POA: Insufficient documentation

## 2019-06-05 DIAGNOSIS — Z6832 Body mass index (BMI) 32.0-32.9, adult: Secondary | ICD-10-CM | POA: Insufficient documentation

## 2019-06-05 DIAGNOSIS — L732 Hidradenitis suppurativa: Secondary | ICD-10-CM | POA: Insufficient documentation

## 2019-06-05 DIAGNOSIS — I10 Essential (primary) hypertension: Secondary | ICD-10-CM | POA: Insufficient documentation

## 2019-06-05 DIAGNOSIS — Z79899 Other long term (current) drug therapy: Secondary | ICD-10-CM | POA: Diagnosis not present

## 2019-06-05 HISTORY — PX: HYDRADENITIS EXCISION: SHX5243

## 2019-06-05 SURGERY — EXCISION, HIDRADENITIS, INGUINAL REGION
Anesthesia: General | Laterality: Left

## 2019-06-05 MED ORDER — LIDOCAINE HCL (PF) 2 % IJ SOLN
INTRAMUSCULAR | Status: AC
  Filled 2019-06-05: qty 10

## 2019-06-05 MED ORDER — GABAPENTIN 300 MG PO CAPS
300.0000 mg | ORAL_CAPSULE | ORAL | Status: AC
  Administered 2019-06-05: 12:00:00 300 mg via ORAL

## 2019-06-05 MED ORDER — ROCURONIUM BROMIDE 50 MG/5ML IV SOLN
INTRAVENOUS | Status: AC
  Filled 2019-06-05: qty 1

## 2019-06-05 MED ORDER — PENTAFLUOROPROP-TETRAFLUOROETH EX AERO
INHALATION_SPRAY | CUTANEOUS | Status: AC
  Administered 2019-06-05: 30 via TOPICAL
  Filled 2019-06-05: qty 116

## 2019-06-05 MED ORDER — FAMOTIDINE 20 MG PO TABS
20.0000 mg | ORAL_TABLET | Freq: Once | ORAL | Status: AC
  Administered 2019-06-05: 12:00:00 20 mg via ORAL

## 2019-06-05 MED ORDER — CHLORHEXIDINE GLUCONATE CLOTH 2 % EX PADS: 6.0000 | MEDICATED_PAD | Freq: Once | CUTANEOUS | Status: DC

## 2019-06-05 MED ORDER — BUPIVACAINE LIPOSOME 1.3 % IJ SUSP: 20.0000 mL | Freq: Once | INTRAMUSCULAR | Status: DC

## 2019-06-05 MED ORDER — DEXAMETHASONE SODIUM PHOSPHATE 10 MG/ML IJ SOLN
INTRAMUSCULAR | Status: DC | PRN
  Administered 2019-06-05: 10 mg via INTRAVENOUS

## 2019-06-05 MED ORDER — MIDAZOLAM HCL 2 MG/2ML IJ SOLN
INTRAMUSCULAR | Status: AC
  Filled 2019-06-05: qty 2

## 2019-06-05 MED ORDER — LIDOCAINE-EPINEPHRINE 1 %-1:100000 IJ SOLN
INTRAMUSCULAR | Status: DC | PRN
  Administered 2019-06-05: 10 mL via INTRAMUSCULAR

## 2019-06-05 MED ORDER — DEXAMETHASONE SODIUM PHOSPHATE 10 MG/ML IJ SOLN
INTRAMUSCULAR | Status: AC
  Filled 2019-06-05: qty 1

## 2019-06-05 MED ORDER — BUPIVACAINE LIPOSOME 1.3 % IJ SUSP
INTRAMUSCULAR | Status: DC | PRN
  Administered 2019-06-05: 20 mL

## 2019-06-05 MED ORDER — PROMETHAZINE HCL 25 MG/ML IJ SOLN: 6.2500 mg | INTRAMUSCULAR | Status: DC | PRN

## 2019-06-05 MED ORDER — LIDOCAINE 2% (20 MG/ML) 5 ML SYRINGE
INTRAMUSCULAR | Status: DC | PRN
  Administered 2019-06-05: 100 mg via INTRAVENOUS

## 2019-06-05 MED ORDER — FENTANYL CITRATE (PF) 250 MCG/5ML IJ SOLN
INTRAMUSCULAR | Status: AC
  Filled 2019-06-05: qty 5

## 2019-06-05 MED ORDER — ONDANSETRON HCL 4 MG/2ML IJ SOLN
INTRAMUSCULAR | Status: AC
  Filled 2019-06-05: qty 2

## 2019-06-05 MED ORDER — ROCURONIUM BROMIDE 100 MG/10ML IV SOLN
INTRAVENOUS | Status: DC | PRN
  Administered 2019-06-05: 40 mg via INTRAVENOUS

## 2019-06-05 MED ORDER — FAMOTIDINE 20 MG PO TABS
ORAL_TABLET | ORAL | Status: AC
  Administered 2019-06-05: 20 mg via ORAL
  Filled 2019-06-05: qty 1

## 2019-06-05 MED ORDER — ACETAMINOPHEN 500 MG PO TABS
1000.0000 mg | ORAL_TABLET | ORAL | Status: AC
  Administered 2019-06-05: 12:00:00 1000 mg via ORAL

## 2019-06-05 MED ORDER — FENTANYL CITRATE (PF) 100 MCG/2ML IJ SOLN: 25.0000 ug | INTRAMUSCULAR | Status: DC | PRN

## 2019-06-05 MED ORDER — LIDOCAINE-EPINEPHRINE 1 %-1:100000 IJ SOLN
INTRAMUSCULAR | Status: AC
  Filled 2019-06-05: qty 1

## 2019-06-05 MED ORDER — EPHEDRINE SULFATE 50 MG/ML IJ SOLN
INTRAMUSCULAR | Status: DC | PRN
  Administered 2019-06-05: 10 mg via INTRAVENOUS

## 2019-06-05 MED ORDER — FENTANYL CITRATE (PF) 100 MCG/2ML IJ SOLN
INTRAMUSCULAR | Status: DC | PRN
  Administered 2019-06-05: 100 ug via INTRAVENOUS
  Administered 2019-06-05: 150 ug via INTRAVENOUS

## 2019-06-05 MED ORDER — ACETAMINOPHEN 500 MG PO TABS
ORAL_TABLET | ORAL | Status: AC
  Administered 2019-06-05: 1000 mg via ORAL
  Filled 2019-06-05: qty 2

## 2019-06-05 MED ORDER — PENTAFLUOROPROP-TETRAFLUOROETH EX AERO
INHALATION_SPRAY | CUTANEOUS | Status: DC | PRN
  Administered 2019-06-05: 11:00:00 30 via TOPICAL

## 2019-06-05 MED ORDER — LACTATED RINGERS IV SOLN
INTRAVENOUS | Status: DC
  Administered 2019-06-05: 12:00:00 via INTRAVENOUS

## 2019-06-05 MED ORDER — ONDANSETRON HCL 4 MG/2ML IJ SOLN
INTRAMUSCULAR | Status: DC | PRN
  Administered 2019-06-05: 4 mg via INTRAVENOUS

## 2019-06-05 MED ORDER — CELECOXIB 200 MG PO CAPS
ORAL_CAPSULE | ORAL | Status: AC
  Administered 2019-06-05: 200 mg via ORAL
  Filled 2019-06-05: qty 1

## 2019-06-05 MED ORDER — IBUPROFEN 800 MG PO TABS: 800.0000 mg | ORAL_TABLET | Freq: Three times a day (TID) | ORAL | 0 refills | Status: DC

## 2019-06-05 MED ORDER — PROPOFOL 10 MG/ML IV BOLUS
INTRAVENOUS | Status: AC
  Filled 2019-06-05: qty 20

## 2019-06-05 MED ORDER — OXYCODONE HCL 5 MG PO TABS: 5.0000 mg | ORAL_TABLET | Freq: Four times a day (QID) | ORAL | 0 refills | Status: DC | PRN

## 2019-06-05 MED ORDER — SUCCINYLCHOLINE CHLORIDE 20 MG/ML IJ SOLN
INTRAMUSCULAR | Status: AC
  Filled 2019-06-05: qty 1

## 2019-06-05 MED ORDER — SUGAMMADEX SODIUM 200 MG/2ML IV SOLN
INTRAVENOUS | Status: DC | PRN
  Administered 2019-06-05: 300 mg via INTRAVENOUS

## 2019-06-05 MED ORDER — PHENYLEPHRINE HCL (PRESSORS) 10 MG/ML IV SOLN
INTRAVENOUS | Status: DC | PRN
  Administered 2019-06-05 (×3): 100 ug via INTRAVENOUS

## 2019-06-05 MED ORDER — GABAPENTIN 300 MG PO CAPS
ORAL_CAPSULE | ORAL | Status: AC
  Administered 2019-06-05: 300 mg via ORAL
  Filled 2019-06-05: qty 1

## 2019-06-05 MED ORDER — METOPROLOL TARTRATE 5 MG/5ML IV SOLN
INTRAVENOUS | Status: DC | PRN
  Administered 2019-06-05: 2 mg via INTRAVENOUS

## 2019-06-05 MED ORDER — BUPIVACAINE HCL (PF) 0.25 % IJ SOLN
INTRAMUSCULAR | Status: AC
  Filled 2019-06-05: qty 30

## 2019-06-05 MED ORDER — CELECOXIB 200 MG PO CAPS
200.0000 mg | ORAL_CAPSULE | ORAL | Status: AC
  Administered 2019-06-05: 12:00:00 200 mg via ORAL

## 2019-06-05 MED ORDER — CEFAZOLIN SODIUM-DEXTROSE 2-4 GM/100ML-% IV SOLN
INTRAVENOUS | Status: AC
  Filled 2019-06-05: qty 100

## 2019-06-05 MED ORDER — PROPOFOL 10 MG/ML IV BOLUS
INTRAVENOUS | Status: DC | PRN
  Administered 2019-06-05: 150 mg via INTRAVENOUS

## 2019-06-05 MED ORDER — CEFAZOLIN SODIUM-DEXTROSE 2-4 GM/100ML-% IV SOLN
2.0000 g | INTRAVENOUS | Status: AC
  Administered 2019-06-05: 12:00:00 2 g via INTRAVENOUS

## 2019-06-05 MED ORDER — BUPIVACAINE LIPOSOME 1.3 % IJ SUSP
INTRAMUSCULAR | Status: AC
  Filled 2019-06-05: qty 20

## 2019-06-05 MED ORDER — MIDAZOLAM HCL 2 MG/2ML IJ SOLN
INTRAMUSCULAR | Status: DC | PRN
  Administered 2019-06-05: 2 mg via INTRAVENOUS

## 2019-06-05 SURGICAL SUPPLY — 30 items
ADH SKN CLS APL DERMABOND .7 (GAUZE/BANDAGES/DRESSINGS)
APL PRP STRL LF DISP 70% ISPRP (MISCELLANEOUS) ×1
BLADE SURG 15 STRL LF DISP TIS (BLADE) ×1 IMPLANT
BLADE SURG 15 STRL SS (BLADE) ×2
CANISTER SUCT 1200ML W/VALVE (MISCELLANEOUS) ×2 IMPLANT
CHLORAPREP W/TINT 26 (MISCELLANEOUS) ×2 IMPLANT
COVER WAND RF STERILE (DRAPES) ×2 IMPLANT
DERMABOND ADVANCED (GAUZE/BANDAGES/DRESSINGS)
DERMABOND ADVANCED .7 DNX12 (GAUZE/BANDAGES/DRESSINGS) IMPLANT
DRAPE LAPAROTOMY TRNSV 106X77 (MISCELLANEOUS) ×2 IMPLANT
ELECT REM PT RETURN 9FT ADLT (ELECTROSURGICAL) ×2
ELECTRODE REM PT RTRN 9FT ADLT (ELECTROSURGICAL) ×1 IMPLANT
GAUZE PACKING IODOFORM 1/2 (PACKING) IMPLANT
GAUZE SPONGE 4X4 12PLY STRL (GAUZE/BANDAGES/DRESSINGS) IMPLANT
GLOVE BIO SURGEON STRL SZ 6.5 (GLOVE) ×4 IMPLANT
GLOVE INDICATOR 7.0 STRL GRN (GLOVE) ×4 IMPLANT
GOWN STRL REUS W/ TWL LRG LVL3 (GOWN DISPOSABLE) ×2 IMPLANT
GOWN STRL REUS W/TWL LRG LVL3 (GOWN DISPOSABLE) ×6
LABEL OR SOLS (LABEL) ×2 IMPLANT
NEEDLE HYPO 22GX1.5 SAFETY (NEEDLE) ×2 IMPLANT
NS IRRIG 1000ML POUR BTL (IV SOLUTION) ×2 IMPLANT
PACK BASIN MINOR ARMC (MISCELLANEOUS) ×2 IMPLANT
SPONGE LAP 18X18 RF (DISPOSABLE) ×2 IMPLANT
STRIP CLOSURE SKIN 1/2X4 (GAUZE/BANDAGES/DRESSINGS) IMPLANT
SUT MNCRL 4-0 (SUTURE) ×2
SUT MNCRL 4-0 27XMFL (SUTURE) ×1
SUT VIC AB 2-0 CT2 27 (SUTURE) ×2 IMPLANT
SUTURE MNCRL 4-0 27XMF (SUTURE) ×1 IMPLANT
SYR 10ML LL (SYRINGE) ×2 IMPLANT
SYR BULB IRRIG 60ML STRL (SYRINGE) ×2 IMPLANT

## 2019-06-05 NOTE — Interval H&P Note (Signed)
History and Physical Interval Note:  06/05/2019 11:04 AM  Sara Foley  has presented today for surgery, with the diagnosis of hidradenitis supprativa.  The various methods of treatment have been discussed with the patient and family. After consideration of risks, benefits and other options for treatment, the patient has consented to  Procedure(s): EXCISION HIDRADENITIS GROIN (Left) as a surgical intervention.  The patient's history has been reviewed, patient examined, no change in status, stable for surgery.  I have reviewed the patient's chart and labs.  Questions were answered to the patient's satisfaction.     Fredirick Maudlin

## 2019-06-05 NOTE — Progress Notes (Signed)
Patient's diastolic pressure exceeded 102 over three readings taken at various times to allow the patient to relax. Dr Rosey Bath was notified of the high diastolic pressure. The patient reported to Dr Rosey Bath that she had brought her home medication Losartan with her today. Dr Rosey Bath agreed to have the patient take her Losartan before the procedure. The patient took the Losartan with sips of water.

## 2019-06-05 NOTE — Discharge Instructions (Signed)

## 2019-06-05 NOTE — Transfer of Care (Signed)
Immediate Anesthesia Transfer of Care Note  Patient: Sara Foley  Procedure(s) Performed: EXCISION HIDRADENITIS GROIN (Left )  Patient Location: PACU  Anesthesia Type:General  Level of Consciousness: awake, alert  and oriented  Airway & Oxygen Therapy: Patient Spontanous Breathing and Patient connected to face mask oxygen  Post-op Assessment: Report given to RN and Post -op Vital signs reviewed and stable  Post vital signs: Reviewed and stable  Last Vitals:  Vitals Value Taken Time  BP 129/85 06/05/19 1316  Temp    Pulse 95 06/05/19 1317  Resp    SpO2 100 % 06/05/19 1317  Vitals shown include unvalidated device data.  Last Pain:  Vitals:   06/05/19 1115  TempSrc: Tympanic  PainSc: 0-No pain         Complications: No apparent anesthesia complications

## 2019-06-05 NOTE — Anesthesia Procedure Notes (Signed)
Procedure Name: Intubation Performed by: Lashonna Rieke, CRNA Pre-anesthesia Checklist: Patient identified, Patient being monitored, Timeout performed, Emergency Drugs available and Suction available Patient Re-evaluated:Patient Re-evaluated prior to induction Oxygen Delivery Method: Circle system utilized Preoxygenation: Pre-oxygenation with 100% oxygen Induction Type: IV induction Ventilation: Mask ventilation without difficulty Laryngoscope Size: 3 and Miller Grade View: Grade I Tube type: Oral Tube size: 7.0 mm Number of attempts: 1 Placement Confirmation: ETT inserted through vocal cords under direct vision,  positive ETCO2 and breath sounds checked- equal and bilateral Secured at: 21 cm Tube secured with: Tape Dental Injury: Teeth and Oropharynx as per pre-operative assessment        

## 2019-06-05 NOTE — Op Note (Signed)
Operative Note  Preoperative Diagnosis: Hidradenitis suppurativa  Postoperative Diagnosis: Same  Operation: Wide local excision of fibrosing hidradenitis suppurativa (left perineal and upper inner thigh region); application of wound VAC, 13 x 9 cm (91 cm)  Surgeon: Duanne Guess, MD  Assistant: None  Anesthesia: General endotracheal  Findings: Thick fibrosis and scarring of the left upper inner thigh and perineum consistent with chronic hidradenitis suppurativa.  There was purulent drainage from the excised area of involvement.  The total affected area was extensive, involving the labia, inguinal folds, pubic and suprapubic area.  Only the most florid areas of inflammation were resected, secondary to the morbidity that would have been incurred if all of the involved tissue were resected.  Indications: This is a 53 year old woman with a long history of hidradenitis.  She has recurrent and more frequent flares in her left upper inner thigh area.  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  She is interested in treatment.  She has had multiple prior courses of antibiotics and incision and drainage procedures, but these have been largely ineffective.  Wide local excision was recommended.  The risks of the procedure were discussed with her and she agreed to proceed.  Home health wound VAC was arranged ahead of time.  Procedure In Detail: The patient was identified in the preoperative holding area and brought to the operating room where she was placed supine on the OR table.  Bony prominences were padded and bilateral sequential compression devices were placed on the lower extremities.  General endotracheal anesthesia was induced without incident.  The patient was then placed in the dorsolithotomy position in yellowfin stirrups.  Care was taken to appropriately pad all bony prominences and appropriately support the limbs.  The patient was placed in  Trendelenburg.  She was sterilely prepped and draped in standard fashion.  A timeout was performed confirming the patient's identity, the procedure being performed, her allergies, and that all necessary equipment was available.  Perioperative antibiotics were administered by the anesthesia service.  The most florid and inflamed area of hidradenitis was selected for resection.  Complete resection was determined to be too morbid procedure and therefore only the worst disease was chosen for removal.  The area surrounding the claims tissue was infiltrated with a one-to-one mixture of 0.25% bupivacaine mixed with 1% lidocaine with epinephrine.  The skin surrounding the tissue was sharply incised.  The dissection continued using electrocautery.  The skin edge was grasped with an Allis clamp and all of the epidermal and deep dermal tissue was excised with electrocautery.  Hemostasis was achieved with electrocautery.  There was a small sinus tract near the patient's perineum that was contiguous with the resection area, so this was also excised.  The area was then irrigated.  Liposomal bupivacaine was infiltrated into the surrounding tissues.  Black granular foam was cut to fit the defect.  It was covered with the adhesive plastic drapes and the suction tubing attached.  The vacuum was attached and a good seal was achieved.  The patient was then returned to the supine position.  She was awakened, extubated, and taken to the postanesthesia care unit in good condition.  EBL: Less than 10 cc  IVF: See anesthesia record  Specimen(s): Left groin and perineal hidradenitis suppurativa  Complications: none immediately apparent.   Counts: all needles, instruments, and sponges were counted and reported to be correct in number at the end of the case.   I was  present for and participated in the entire operation.  Fredirick Maudlin 1:22 PM

## 2019-06-05 NOTE — Anesthesia Post-op Follow-up Note (Signed)
Anesthesia QCDR form completed.        

## 2019-06-05 NOTE — Anesthesia Preprocedure Evaluation (Addendum)
Anesthesia Evaluation  Patient identified by MRN, date of birth, ID band Patient awake    Reviewed: Allergy & Precautions, H&P , NPO status , Patient's Chart, lab work & pertinent test results, reviewed documented beta blocker date and time   Airway Mallampati: II  TM Distance: >3 FB Neck ROM: full    Dental  (+) Dental Advidsory Given   Pulmonary neg pulmonary ROS,    Pulmonary exam normal        Cardiovascular Exercise Tolerance: Good hypertension, (-) angina(-) Past MI and (-) Cardiac Stents Normal cardiovascular exam(-) dysrhythmias (-) Valvular Problems/Murmurs     Neuro/Psych negative neurological ROS  negative psych ROS   GI/Hepatic negative GI ROS, Neg liver ROS,   Endo/Other  negative endocrine ROS  Renal/GU negative Renal ROS  negative genitourinary   Musculoskeletal   Abdominal   Peds  Hematology negative hematology ROS (+)   Anesthesia Other Findings Past Medical History: 2013: Abnormal mammogram, unspecified     Comment:  illdefined left inferior density; ultrasound was normal No date: Breast screening, unspecified No date: Gestational diabetes     Comment:  1996 2013: Hidradenitis     Comment:  hidradentitis suppurativa No date: Obesity, unspecified No date: Screening for obesity No date: Unspecified essential hypertension   Reproductive/Obstetrics negative OB ROS                            Anesthesia Physical Anesthesia Plan  ASA: II  Anesthesia Plan: General   Post-op Pain Management:    Induction: Intravenous  PONV Risk Score and Plan: 3 and Ondansetron, Dexamethasone, Midazolam and Treatment may vary due to age or medical condition  Airway Management Planned: Oral ETT  Additional Equipment:   Intra-op Plan:   Post-operative Plan: Extubation in OR  Informed Consent: I have reviewed the patients History and Physical, chart, labs and discussed the  procedure including the risks, benefits and alternatives for the proposed anesthesia with the patient or authorized representative who has indicated his/her understanding and acceptance.     Dental Advisory Given  Plan Discussed with: Anesthesiologist, CRNA and Surgeon  Anesthesia Plan Comments:         Anesthesia Quick Evaluation

## 2019-06-05 NOTE — Anesthesia Postprocedure Evaluation (Signed)
Anesthesia Post Note  Patient: Sara Foley  Procedure(s) Performed: EXCISION HIDRADENITIS GROIN (Left )  Patient location during evaluation: PACU Anesthesia Type: General Level of consciousness: awake and alert Pain management: pain level controlled Vital Signs Assessment: post-procedure vital signs reviewed and stable Respiratory status: spontaneous breathing, nonlabored ventilation, respiratory function stable and patient connected to nasal cannula oxygen Cardiovascular status: blood pressure returned to baseline and stable Postop Assessment: no apparent nausea or vomiting Anesthetic complications: no     Last Vitals:  Vitals:   06/05/19 1406 06/05/19 1411  BP: 123/82   Pulse: 83 86  Resp: 10 16  Temp:  (!) 36.2 C  SpO2: 96% 94%    Last Pain:  Vitals:   06/05/19 1411  TempSrc:   PainSc: 0-No pain                 Martha Clan

## 2019-06-06 ENCOUNTER — Emergency Department
Admission: EM | Admit: 2019-06-06 | Discharge: 2019-06-06 | Disposition: A | Discharge status: Home / Self Care | Payer: BC Managed Care – PPO | Attending: Emergency Medicine | Admitting: Emergency Medicine

## 2019-06-06 ENCOUNTER — Encounter: Payer: Self-pay | Admitting: General Surgery

## 2019-06-06 DIAGNOSIS — Z79899 Other long term (current) drug therapy: Secondary | ICD-10-CM | POA: Insufficient documentation

## 2019-06-06 DIAGNOSIS — L7622 Postprocedural hemorrhage and hematoma of skin and subcutaneous tissue following other procedure: Secondary | ICD-10-CM | POA: Diagnosis present

## 2019-06-06 DIAGNOSIS — Z5189 Encounter for other specified aftercare: Secondary | ICD-10-CM

## 2019-06-06 DIAGNOSIS — I10 Essential (primary) hypertension: Secondary | ICD-10-CM | POA: Diagnosis not present

## 2019-06-06 MED ORDER — LIDOCAINE HCL (PF) 1 % IJ SOLN
INTRAMUSCULAR | Status: AC
  Filled 2019-06-06: qty 5

## 2019-06-06 MED ORDER — SILVER NITRATE-POT NITRATE 75-25 % EX MISC
1.0000 | Freq: Once | CUTANEOUS | Status: AC
  Administered 2019-06-06: 15:00:00 1 via TOPICAL

## 2019-06-06 MED ORDER — LIDOCAINE HCL (PF) 1 % IJ SOLN: 5.0000 mL | Freq: Once | INTRAMUSCULAR | Status: DC

## 2019-06-06 NOTE — ED Notes (Signed)
This RN paged on call surgery and notified them of wound vac replacement. Pt also needs charger. Family member Publishing copy to ER.

## 2019-06-06 NOTE — ED Provider Notes (Signed)
-----------------------------------------   3:06 PM on 06/06/2019 -----------------------------------------  Blood pressure 115/80, pulse 70, temperature 98.3 F (36.8 C), temperature source Oral, resp. rate 17, height 5\' 6"  (1.676 m), weight 86.2 kg, SpO2 99 %.  Assuming care from Dr. Joan Mayans.  In short, Sara Foley is a 53 y.o. female with a chief complaint of Wound Check .  Refer to the original H&P for additional details.  The current plan of care is to follow-up surgical intervention for bleeding from wound vac site.  Wound VAC replaced by general surgery, who will plan to follow-up with the patient as an outpatient.  Counseled her to return to the ED for new or worsening symptoms, patient agrees with plan.    Blake Divine, MD 06/06/19 [phone]

## 2019-06-06 NOTE — Consult Note (Addendum)
SURGICAL CONSULTATION NOTE   HISTORY OF PRESENT ILLNESS (HPI):  53 y.o. female presented to [facility] ED for evaluation of malfunction of the wound VAC system and bleeding from the wound. Patient reports she had wide local excision of hidradenitis yesterday.  She was sent home with wound VAC system.  Today she had multiple issues with the wound VAC system and is stopped working.  She also reports profuse bleeding at home that poured from the wound VAC to the floor of her bathroom.  She came to the ED for evaluation of the wound VAC system and the bleeding.  Denies pain.  There is no pain radiation.  There is no alleviating or aggravating factor.  EMS placed on pressure dressing around the wound.  Surgery is consulted by Dr. Colon BranchMonks in this context for evaluation and management of bleeding wound and wound VAC system.  PAST MEDICAL HISTORY (PMH):  Past Medical History:  Diagnosis Date  . Abnormal mammogram, unspecified 2013   illdefined left inferior density; ultrasound was normal  . Breast screening, unspecified   . Gestational diabetes    1996  . Hidradenitis 2013   hidradentitis suppurativa  . Obesity, unspecified   . Screening for obesity   . Unspecified essential hypertension      PAST SURGICAL HISTORY (PSH):  Past Surgical History:  Procedure Laterality Date  . ABDOMINAL HYSTERECTOMY  2012   partial  . BREAST BIOPSY Left 04-14-12   left breast stereotactic biopsy; fibroadenoma  . BREAST REDUCTION SURGERY  1999  . HYDRADENITIS EXCISION Left 06/05/2019   Procedure: EXCISION HIDRADENITIS GROIN;  Surgeon: Duanne Guessannon, Jennifer, MD;  Location: [facility] ORS;  Service: General;  Laterality: Left;  . PARTIAL HYSTERECTOMY  July 2013   due to fibroid tumors  . REDUCTION MAMMAPLASTY Bilateral 2000     MEDICATIONS:  Prior to Admission medications   Medication Sig Start Date End Date Taking? Authorizing Provider  acetaminophen (TYLENOL) 500 MG tablet Take 1,000 mg by mouth every 6 (six) hours as  needed (for pain.).    [provider]  Cholecalciferol (VITAMIN D3) 1.25 MG (50000 UT) CAPS Take 50,000 Units by mouth every Sunday.    [provider]  Cyanocobalamin (B-12) 1000 MCG LOZG DISSOLVE ONE TABLET UNDER THE TONGUE DAILY 04/25/19   Alba CorySowles, Krichna, MD  ibuprofen (ADVIL) 800 MG tablet Take 1 tablet (800 mg total) by mouth 3 (three) times daily. 06/05/19   Duanne Guessannon, Jennifer, MD  losartan (COZAAR) 100 MG tablet Take 1 tablet (100 mg total) by mouth daily. 05/01/19   Alba CorySowles, Krichna, MD  oxyCODONE (OXY IR/ROXICODONE) 5 MG immediate release tablet Take 1 tablet (5 mg total) by mouth every 6 (six) hours as needed for severe pain. 06/05/19   Duanne Guessannon, Jennifer, MD     ALLERGIES:  No Known Allergies   SOCIAL HISTORY:  Social History   Socioeconomic History  . Marital status: Divorced    Spouse name: Not on file  . Number of children: 3  . Years of education: Not on file  . Highest education level: Not on file  Occupational History  . Not on file  Social Needs  . Financial resource strain: Somewhat hard  . Food insecurity    Worry: Never true    Inability: Never true  . Transportation needs    Medical: No    Non-medical: No  Tobacco Use  . Smoking status: Never Smoker  . Smokeless tobacco: Never Used  Substance and Sexual Activity  . Alcohol use: Yes  Alcohol/week: 3.0 standard drinks    Types: 3 Standard drinks or equivalent per week  . Drug use: No  . Sexual activity: Not Currently  Lifestyle  . Physical activity    Days per week: 5 days    Minutes per session: 10 min  . Stress: Not at all  Relationships  . Social connections    Talks on phone: More than three times a week    Gets together: More than three times a week    Attends religious service: More than 4 times per year    Active member of club or organization: Yes    Attends meetings of clubs or organizations: More than 4 times per year    Relationship status: Divorced  . Intimate partner  violence    Fear of current or ex partner: No    Emotionally abused: No    Physically abused: No    Forced sexual activity: No  Other Topics Concern  . Not on file  Social History Narrative  . Not on file    The patient currently resides (home / rehab facility / nursing home): Home The patient normally is (ambulatory / bedbound): Ambulatory   FAMILY HISTORY:  Family History  Problem Relation Age of Onset  . Prostate cancer Other   . Colon cancer Maternal Uncle   . Diabetes Mother   . Arthritis/Rheumatoid Mother   . Leukemia Father      REVIEW OF SYSTEMS:  Constitutional: denies weight loss, fever, chills, or sweats  Eyes: denies any other vision changes, history of eye injury  ENT: denies sore throat, hearing problems  Respiratory: denies shortness of breath, wheezing  Cardiovascular: denies chest pain, palpitations  Gastrointestinal: denies abdominal pain, N/V, or diarrhea Genitourinary: denies burning with urination or urinary frequency Musculoskeletal: denies any other joint pains or cramps  Skin: denies any other rashes or skin discolorations.  Positive for bleeding from the wound Neurological: denies any other headache, dizziness, weakness  Psychiatric: denies any other depression, anxiety   All other review of systems were negative   VITAL SIGNS:  Temp:  [98.3 F (36.8 C)] 98.3 F (36.8 C) (11/07 1001) Pulse Rate:  [70-96] 70 (11/07 1430) Resp:  [17-20] 17 (11/07 1430) BP: (107-124)/(74-80) 115/80 (11/07 1430) SpO2:  [97 %-100 %] 99 % (11/07 1430) Weight:  [86.2 kg] 86.2 kg (11/07 1000)     Height: 5\' 6"  (167.6 cm) Weight: 86.2 kg BMI (Calculated): 30.68   INTAKE/OUTPUT:  This shift: No intake/output data recorded.  Last 2 shifts: @IOLAST2SHIFTS @   PHYSICAL EXAM:  Constitutional:  -- Normal body habitus  -- Awake, alert, and oriented x3  Eyes:  -- Pupils equally round and reactive to light  -- No scleral icterus  Ear, nose, and throat:  -- No jugular  venous distension  Pulmonary:  -- No crackles  -- Equal breath sounds bilaterally -- Breathing non-labored at rest Cardiovascular:  -- S1, S2 present  -- No pericardial rubs Gastrointestinal:  -- Abdomen soft, nontender, non-distended, no guarding or rebound tenderness -- No abdominal masses appreciated, pulsatile or otherwise  Musculoskeletal and Integumentary:  -- Wounds or skin discoloration: Left upper extremity inner thigh open wound with multiple areas of bleeding.  The bleeding is coming from small vessels.  No necrotic tissue.  No purulent discharge. -- Extremities: B/L UE and LE FROM, hands and feet warm, no edema  Neurologic:  -- Motor function: intact and symmetric -- Sensation: intact and symmetric   Picture shows multiple areas  of bleeding controlled with nitrate stick and pressure with Surgicel for more than 10 minutes.    Labs:  CBC Latest Ref Rng & Units 10/14/2018 01/07/2017 01/17/2016  WBC 3.8 - 10.8 Thousand/uL 6.8 8.8 6.7  Hemoglobin 11.7 - 15.5 g/dL 42.7 06.2 37.6  Hematocrit 35.0 - 45.0 % 37.7 40.4 43.6  Platelets 140 - 400 Thousand/uL 307 284 327   CMP Latest Ref Rng & Units 10/14/2018 07/17/2017 01/07/2017  Glucose 65 - 99 mg/dL 88 86 89  BUN 7 - 25 mg/dL 12 12 10   Creatinine 0.50 - 1.05 mg/dL 2.83 1.51  Sodium 135 - 146 mmol/L 140 137 134(L)  Potassium 3.5 - 5.3 mmol/L 3.6 4.3 4.1  Chloride 98 - 110 mmol/L 104 101 100  CO2 20 - 32 mmol/L 28 30 26   Calcium 8.6 - 10.4 mg/dL 8.9 9.0 7.61)  Total Protein 6.1 - 8.1 g/dL 7.2 7.3 7.1  Total Bilirubin 0.2 - 1.2 mg/dL 0.4 0.4 0.3  Alkaline Phos 33 - 115 U/L - - 100  AST 10 - 35 U/L 14 13 12   ALT 6 - 29 U/L 11 9 9     Imaging studies:  No images pertinent to this evaluation  Assessment/Plan:  53 y.o. female with multiple bleeders from the wound and negative pressure dressing malfunction, complicated by pertinent comorbidities including chronic hidradenitis suppurativa with fibrosis and obesity. The  previous negative pressure dressing was removed.  The bleeders were identified.  The bleeders were controlled with silver nitrate sticks and Surgicel.  I personally apply pressure for more than 10 minutes.  I was able to control the different bleeders after a prolonged period of pressure.  Picture above shows the wound just before applying the new wound VAC.  I cleaned the wound with soap and water.  Then I applied a layer of Surgicel.  Then I applied the wound VAC sponge cut to the size (13 cm x 9 cm, 58.9 cm2) and shape of the wound (triangular).  The aggressive plastic drapes were applied and the VAC incision tubing placed.  Wound VAC machine was turned on and adequate negative pressure incision was achieved.  Patient tolerated the procedure well.  From surgery standpoint patient can be discharge.  6.0(V, MD

## 2019-06-06 NOTE — Discharge Instructions (Signed)
Thank you for letting us take care of you in the emergency department today.   Please continue to take any regular, prescribed medications.   Please follow up with: - Your surgeon, as directed by them  Please return to the ER for any new or worsening symptoms.

## 2019-06-06 NOTE — ED Provider Notes (Signed)
[facility] Emergency Department Provider Note  ____________________________________________   First MD Initiated Contact with Patient 06/06/19 1311     (approximate)  I have reviewed the triage vital signs and the nursing notes.  History  Chief Complaint Wound Check    HPI Sara Foley is a 53 y.o. female s/p hidradenitis excision yesterday (11/6) who presents for bleeding from her wound vac site in the left groin. She had a wound vac placed yesterday post-operatively with plans for a home health visit for re-dressing on Monday. She states overnight the battery alarm rang and she thought she troubleshot according to the screen (but does not appear she plugged the machine in for charging). This AM she woke up to the battery alarm beeping again. When she bent over she noticed bleeding from her wound vac and therefore called EMS. She denies any pain. Not on any blood thinners or anticoagulation.    Past Medical Hx Past Medical History:  Diagnosis Date  . Abnormal mammogram, unspecified 2013   illdefined left inferior density; ultrasound was normal  . Breast screening, unspecified   . Gestational diabetes    1996  . Hidradenitis 2013   hidradentitis suppurativa  . Obesity, unspecified   . Screening for obesity   . Unspecified essential hypertension     Problem List Patient Active Problem List   Diagnosis Date Noted  . Metabolic syndrome 35/00/9381  . Low HDL (under 40) 02/28/2016  . History of hysterectomy leaving cervix intact 01/19/2016  . Vitamin D deficiency 01/18/2016  . Vitamin B12 deficiency 01/18/2016  . Obesity (BMI 30.0-34.9) 01/17/2016  . History of gestational diabetes 01/17/2016  . Hypertension, benign 01/17/2016  . Hidradenitis suppurativa 01/17/2016  . History of iron deficiency anemia 01/17/2016  . Neck pain 01/17/2016    Past Surgical Hx Past Surgical History:  Procedure Laterality Date  . ABDOMINAL HYSTERECTOMY  2012   partial  . BREAST BIOPSY Left 04-14-12   left breast stereotactic biopsy; fibroadenoma  . BREAST REDUCTION SURGERY  1999  . HYDRADENITIS EXCISION Left 06/05/2019   Procedure: EXCISION HIDRADENITIS GROIN;  Surgeon: Fredirick Maudlin, MD;  Location: [facility] ORS;  Service: General;  Laterality: Left;  . PARTIAL HYSTERECTOMY  July 2013   due to fibroid tumors  . REDUCTION MAMMAPLASTY Bilateral 2000    Medications Prior to Admission medications   Medication Sig Start Date End Date Taking? Authorizing Provider  acetaminophen (TYLENOL) 500 MG tablet Take 1,000 mg by mouth every 6 (six) hours as needed (for pain.).    [provider]  Cholecalciferol (VITAMIN D3) 1.25 MG (50000 UT) CAPS Take 50,000 Units by mouth every Sunday.    [provider]  Cyanocobalamin (B-12) 1000 MCG LOZG DISSOLVE ONE TABLET UNDER THE TONGUE DAILY 04/25/19   Steele Sizer, MD  ibuprofen (ADVIL) 800 MG tablet Take 1 tablet (800 mg total) by mouth 3 (three) times daily. 06/05/19   Fredirick Maudlin, MD  losartan (COZAAR) 100 MG tablet Take 1 tablet (100 mg total) by mouth daily. 05/01/19   Steele Sizer, MD  oxyCODONE (OXY IR/ROXICODONE) 5 MG immediate release tablet Take 1 tablet (5 mg total) by mouth every 6 (six) hours as needed for severe pain. 06/05/19   Fredirick Maudlin, MD    Allergies Patient has no known allergies.  Family Hx Family History  Problem Relation Age of Onset  . Prostate cancer Other   . Colon cancer Maternal Uncle   . Diabetes Mother   . Arthritis/Rheumatoid Mother   .  Leukemia Father     Social Hx Social History   Tobacco Use  . Smoking status: Never Smoker  . Smokeless tobacco: Never Used  Substance Use Topics  . Alcohol use: Yes    Alcohol/week: 3.0 standard drinks    Types: 3 Standard drinks or equivalent per week  . Drug use: No     Review of Systems  Constitutional: Negative for fever, chills. Eyes: Negative for visual changes. ENT: Negative for sore throat.  Cardiovascular: Negative for chest pain. Respiratory: Negative for shortness of breath. Gastrointestinal: Negative for nausea, vomiting.  Genitourinary: Negative for dysuria. Musculoskeletal: Negative for leg swelling. Skin: + bleeding from wound vac Neurological: Negative for for headaches.   Physical Exam  Vital Signs: ED Triage Vitals  Enc Vitals Group     BP 06/06/19 1001 110/77     Pulse Rate 06/06/19 1001 96     Resp 06/06/19 1001 20     Temp 06/06/19 1001 98.3 F (36.8 C)     Temp Source 06/06/19 1001 Oral     SpO2 06/06/19 1001 97 %     Weight 06/06/19 1000 190 lb (86.2 kg)     Height 06/06/19 1000 5\' 6"  (1.676 m)     Head Circumference --      Peak Flow --      Pain Score 06/06/19 1000 0     Pain Loc --      Pain Edu? --      Excl. in GC? --     Constitutional: Alert and oriented.  Head: Normocephalic. Atraumatic. Eyes: Conjunctivae clear. Sclera anicteric. Nose: No congestion. No rhinorrhea. Mouth/Throat: Wearing mask.  Neck: No stridor.   Cardiovascular: Normal rate. Respiratory: Normal respiratory effort.  No audible wheezing. Musculoskeletal: Wound vac to LEFT groin area with slow blood oozing from dressing site. Appears wound vac battery is low with inadequate negative pressure. Surrounding skin is well appearing. No purulence, erythema, induration. Neurologic:  Normal speech and language.  Skin: See MSK above.  Psychiatric: Mood and affect are appropriate for situation.  EKG  N/A   Radiology  N/A   Procedures  Procedure(s) performed (including critical care):  Procedures   Initial Impression / Assessment and Plan / ED Course  53 y.o. female who presents to the ED for bleeding from wound vac. Placed yesterday after excision for hidradenitis.   Wound vac to LEFT groin area with slow blood oozing from dressing site. Surrounding skin is well appearing w/o evidence of infection. Suspect wound vac with inadequate negative pressure, likely due  to low battery.   Discussed with surgery who will come evaluate wound and replace wound vac, after which patient can be discharged home with continued wound care.   Final Clinical Impression(s) / ED Diagnosis  Final diagnoses:  Visit for wound check       Note:  This document was prepared using Dragon voice recognition software and may include unintentional dictation errors.   44., MD 06/06/19 [phone]

## 2019-06-06 NOTE — ED Triage Notes (Signed)
Pt in via EMS from home with c/o a bleeding wound vac. EMS reports pt has a procedure on the back of her left leg and went home and noticed the wound area dripping blood today. EMS reports per pt the pain got worse and pt became hot and dizzy. EMS packed the wound with quick clot and wrapped it so the bleeding stopped.  129/81. #20g to left AC, pt received 565mls of fluid.

## 2019-06-06 NOTE — ED Triage Notes (Signed)
Pt reports had a lump removed from her leg, and had a lump removed. Pt has a wound vac in place and reports the wound started leaking. Pt was unsure hoe to get it to stop bleeding and she became anxious and called 911. Pt reports feels better now just wants to make sure it is not bleeding.

## 2019-06-08 ENCOUNTER — Telehealth: Payer: Self-pay

## 2019-06-08 LAB — SURGICAL PATHOLOGY

## 2019-06-08 NOTE — Telephone Encounter (Signed)
Spoke with Levander Campion, the nurse with [facility]. She says that they will be able to do her wound vac changes three times a week as ordered. The patient will follow up with Dr Celine Ahr on 06/18/19 and we will do the dressing change that day. The patient is aware to bring her supplies with her that day.

## 2019-06-12 ENCOUNTER — Ambulatory Visit: Payer: BC Managed Care – PPO

## 2019-06-12 ENCOUNTER — Other Ambulatory Visit: Payer: Self-pay

## 2019-06-12 MED ORDER — IBUPROFEN 800 MG PO TABS: 800.0000 mg | ORAL_TABLET | Freq: Three times a day (TID) | ORAL | 0 refills | Status: DC

## 2019-06-16 ENCOUNTER — Telehealth: Payer: Self-pay

## 2019-06-16 NOTE — Telephone Encounter (Signed)
Diane from [place] states she is applying  atataic with the wound vac changes. She just wanted to let us know.   Also states she need order to place for more supplies.   We will need to discuss home bound option for the patient to continue receiving home health care services. 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

## 2019-06-18 ENCOUNTER — Ambulatory Visit (INDEPENDENT_AMBULATORY_CARE_PROVIDER_SITE_OTHER): Payer: BC Managed Care – PPO | Admitting: General Surgery

## 2019-06-18 ENCOUNTER — Other Ambulatory Visit: Payer: Self-pay

## 2019-06-18 ENCOUNTER — Encounter: Payer: Self-pay | Admitting: General Surgery

## 2019-06-18 VITALS — BP 156/88 | HR 108 | Temp 97.7°F | Resp 12 | Ht 68.0 in | Wt 190.0 lb

## 2019-06-18 DIAGNOSIS — L732 Hidradenitis suppurativa: Secondary | ICD-10-CM

## 2019-06-18 NOTE — Progress Notes (Signed)
Sara Foley is here today for a postoperative visit.  She is a 53 year old woman who underwent wide local excision of inner thigh hidradenitis on June 05, 2019.  She had complications with her wound VAC, namely that she lost suction and had bleeding.  She presented to the emergency department on November 7.  Her VAC was replaced and she has done well with it since.  She is being visited by home health on Mondays Wednesdays and Fridays.  They are changing the dressing daily, but [name] reports that they are concerned about skin breakdown surrounding the surgical site as well as a foul odor.  [name] reports that she is having some difficulty with toileting and sitting secondary to the wound site.  It is also exquisitely tender at the most distal portion of the wound.  This is also where bleeding is most frequently seen with her dressing changes.  Today's Vitals   06/18/19 1414  BP: (!) 156/88  Pulse: (!) 108  Resp: 12  Temp: 97.7 F (36.5 C)  TempSrc: Temporal  SpO2: 98%  Weight: 190 lb (86.2 kg)  Height: 5\' 8"  (1.727 m)  PainSc: 0-No pain   Body mass index is 28.89 kg/m. Upon entering the room, there is a strong foul odor.  The wound VAC was removed.  There is evidence of periwound skin breakdown that appears secondary to moisture being trapped underneath the occlusive dressing portion of the VAC dressing.  The sponge was removed to reveal moist and beefy granulation tissue.  There is no purulent exudate or drainage present.  The wound itself does not have a strong odor although the sponge and the canister do.  There is a small amount of bleeding from the wound edges.  I attempted to treat the bleeding with silver nitrate sticks, however this was too painful for the patient.  I am still not sure as to why the wound smells as bad as it does.  I suggested that we try wet-to-dry dressing changes for a week to see if this improves things.  The patient was instructed to remove the dressing  before the wound care nurse comes to her house and get in the shower to allow warm soapy water to flow over the wound.  On the days that the nurse does not come to the house, she will come to our clinic for dressing change.  She should similarly, take a shower with the dressing removed prior to coming to clinic and then present to clinic for her dressing change.  We have ordered some silver alginate dressings which we will incorporate into her wound care to see if this also aids in both healing and reduction of bioburden that could be contributing to the odor.  She will have a nurse visit on Tuesday next week.

## 2019-06-18 NOTE — Patient Instructions (Signed)
We removed the wound vac today.  We will start wet to dry dressing changes daily.  May need to use a large Tegaderm over the area to prevent urine and feces entering the wound bed.   Patient to remove all dressings and packing, shower and wash the area with soap and water then place wet to dry dressing and secure with Tegaderm  Please see your appointment listed below. Marland Kitchen

## 2019-06-22 ENCOUNTER — Telehealth: Payer: Self-pay

## 2019-06-22 ENCOUNTER — Telehealth: Payer: Self-pay | Admitting: *Deleted

## 2019-06-22 NOTE — Telephone Encounter (Signed)
FMLA paperwork faxed to [phone]

## 2019-06-22 NOTE — Telephone Encounter (Signed)
Sara Foley from [place] needs written order sent to be signed. Fax to [phone]. Wet to dry dressing changes daily. Wound vac was removed in office 06/19/19 and wet to dry dressing was placed.

## 2019-06-23 ENCOUNTER — Other Ambulatory Visit: Payer: Self-pay

## 2019-06-23 ENCOUNTER — Ambulatory Visit (INDEPENDENT_AMBULATORY_CARE_PROVIDER_SITE_OTHER): Payer: BC Managed Care – PPO | Admitting: General Surgery

## 2019-06-23 DIAGNOSIS — L732 Hidradenitis suppurativa: Secondary | ICD-10-CM

## 2019-06-23 NOTE — Patient Instructions (Signed)
Please continue daily dressing changes. Please see follow up appointment listed below.

## 2019-06-23 NOTE — Progress Notes (Signed)
Sara Foley is here for a wound check.  She is a 53 year old woman with extensive hidradenitis.  She underwent wide local excision of a particularly inflamed lesion on her inner/posterior upper left thigh.  She was sent home with a wound VAC, but at her clinic visit last week, there was evidence of skin maceration around the site as well as a strong foul odor from the wound.  She was changed to moist gauze dressing changes.  She states that she prefers the wet-to-dry dressing over the VAC.  Today, she states that the pain is improving and she is able to sit for longer periods of time.  She denies any fevers or chills.  No nausea or vomiting.  The odor has resolved.  There were no vitals filed for this visit. There is no height or weight on file to calculate BMI.  Focused examination: The existing dressing was removed.  There is healthy pink granulation tissue in the wound bed.  There is no odor.  There is no purulent drainage or surrounding wound erythema.  Impression and plan: This is a 53 year old woman with extensive hidradenitis.  She underwent wide local excision.  The area is healing nicely.  We will continue with moist gauze dressing changes.  We have ordered silver alginate to our clinic and hopefully it will be here by the time she arrives for her next visit, a week from today.  This should enable her to have less frequent dressing changes while still maintaining an antimicrobial millieu.

## 2019-06-30 ENCOUNTER — Ambulatory Visit: Payer: BC Managed Care – PPO

## 2019-06-30 ENCOUNTER — Other Ambulatory Visit: Payer: Self-pay

## 2019-06-30 ENCOUNTER — Ambulatory Visit: Payer: Self-pay

## 2019-06-30 DIAGNOSIS — L732 Hidradenitis suppurativa: Secondary | ICD-10-CM

## 2019-06-30 NOTE — Progress Notes (Signed)
Removed dirty wet to dry dressing. Replaced with Alginate dressing and secured with Tegaderm.  Wound bed beefy red. No pus drainage. Outer skin looks good.

## 2019-06-30 NOTE — Telephone Encounter (Signed)
Written order faxed [phone] to [facility] to stop wet to dry dressing changes and begin Alginate dressing changes and securing with Tegaderm dressing.   Spoke with Diane and she confirmed understanding of dressing changes.

## 2019-06-30 NOTE — Patient Instructions (Signed)
Please call the office and we will schedule your dressing change if your mother is not comfortable with doing them.   Please see your follow up appointment with Dr.Cannon listed below.

## 2019-07-02 ENCOUNTER — Ambulatory Visit: Payer: BC Managed Care – PPO

## 2019-07-02 ENCOUNTER — Other Ambulatory Visit: Payer: Self-pay

## 2019-07-02 DIAGNOSIS — L732 Hidradenitis suppurativa: Secondary | ICD-10-CM

## 2019-07-02 NOTE — Progress Notes (Addendum)
Patient ID: Sara Foley, female   DOB: September 26, 1965, 53 y.o.   MRN: [id number] Patient came in today for a wound check. Dressing removed. Tissues red and healthy in appearance. Small amount of exudate at superior end of the wound. Alginate dressing applied with 4x4s and Tegaderm dressing. Patient to follow up as scheduled.

## 2019-07-02 NOTE — Patient Instructions (Addendum)
Follow up as scheduled.  Continue daily dressing changes.

## 2019-07-07 ENCOUNTER — Ambulatory Visit (INDEPENDENT_AMBULATORY_CARE_PROVIDER_SITE_OTHER): Payer: BC Managed Care – PPO | Admitting: General Surgery

## 2019-07-07 ENCOUNTER — Encounter: Payer: Self-pay | Admitting: General Surgery

## 2019-07-07 ENCOUNTER — Other Ambulatory Visit: Payer: Self-pay

## 2019-07-07 VITALS — BP 122/86 | HR 115 | Temp 97.9°F | Resp 14 | Ht 65.0 in | Wt 215.6 lb

## 2019-07-07 DIAGNOSIS — L732 Hidradenitis suppurativa: Secondary | ICD-10-CM

## 2019-07-07 NOTE — Patient Instructions (Addendum)
Please call with any questions or concerns. Continue daily wound changes.  Please see your appointment listed below.

## 2019-07-07 NOTE — Progress Notes (Signed)
Sara Foley is here for a wound check.  She is a 53 year old woman with extensive hidradenitis.  She underwent wide local excision of a particularly inflamed lesion on Sara inner/posterior upper left thigh.    She initially had a wound VAC, but there were issues with skin maceration as well as a foul odor.  We changed Sara to wet-to-dry dressings which she did much better with.  After our last visit, on November 24, we changed Sara to silver alginate dressings.  These have been performed both at home by home health as well as in our clinic by Sara Foley.  She denies any fevers or chills.  The discomfort in the area has markedly improved.  There is no foul odor.  Today's Vitals   07/07/19 1402  BP: 122/86  Pulse: (!) 115  Resp: 14  Temp: 97.9 F (36.6 C)  SpO2: 97%  Weight: 215 lb 9.6 oz (97.8 kg)  Height: 5\' 5"  (1.651 m)  PainSc: 0-No pain   Body mass index is 35.88 kg/m. Focused examination of the wound site demonstrates that it is beginning to epithelialize at the edges.  There is beefy red granulation tissue present that is nearly flush with the skin surface.  There is no exudate or drainage present.  Sara dressing was changed again today.  She will continue dressing changes at home with the silver alginate.  I will see Sara back in about 3 weeks, at which time I anticipate that the wound will have epithelialized.  She is welcome to return to work as tolerated without restrictions.

## 2019-07-10 ENCOUNTER — Ambulatory Visit: Payer: BC Managed Care – PPO

## 2019-07-10 ENCOUNTER — Other Ambulatory Visit: Payer: Self-pay

## 2019-07-10 DIAGNOSIS — L732 Hidradenitis suppurativa: Secondary | ICD-10-CM

## 2019-07-10 NOTE — Progress Notes (Signed)
Patient is here for wound check and dressing change. Area is getting smaller.

## 2019-07-28 ENCOUNTER — Encounter: Payer: Self-pay | Admitting: General Surgery

## 2019-07-28 ENCOUNTER — Other Ambulatory Visit: Payer: Self-pay

## 2019-07-28 ENCOUNTER — Ambulatory Visit (INDEPENDENT_AMBULATORY_CARE_PROVIDER_SITE_OTHER): Payer: Self-pay | Admitting: General Surgery

## 2019-07-28 VITALS — BP 155/85 | HR 96 | Temp 97.5°F | Resp 14 | Ht 65.0 in | Wt 221.0 lb

## 2019-07-28 DIAGNOSIS — L732 Hidradenitis suppurativa: Secondary | ICD-10-CM

## 2019-07-28 NOTE — Patient Instructions (Signed)
Follow up here in 3 weeks, unless the area is fully closed.  Have [facility] call us to get an extension on your home wound care.   May use Chlorhexidine gluconate surgical soap

## 2019-07-28 NOTE — Progress Notes (Signed)
Sara Foley is here today for a wound check.  She is a 53 year old woman who underwent wide local excision of severe hidradenitis.  She has been managed with dressing changes using silver alginate.  She was last seen by me on December 8.  At that time, the wound was nearly flush with the surrounding skin and had begun to epithelialize.  She continues to do well with her dressing changes at home.  She denies any fevers or chills.  There has been no purulent drainage or foul odor from the site.  She states that there has been no pain in the area for quite some time.  She is back to teaching virtually.  Today's Vitals   07/28/19 1044  BP: (!) 155/85  Pulse: 96  Resp: 14  Temp: (!) 97.5 F (36.4 C)  SpO2: 98%  Weight: 221 lb (100.2 kg)  Height: 5\' 5"  (1.651 m)   Body mass index is 36.78 kg/m. Focused examination: The wound continues to epithelialize.  The area of granulation tissue is substantially smaller than at the last visit.  There is no drainage or surrounding erythema.  Impression and plan: This is a 53 year old woman who had severe hidradenitis suppurativa which was excised surgically.  She is continuing to heal nicely.  She should continue with the dressing changes at home until the wound is completely epithelialized.  We will see her back for what will hopefully be a final wound check in 2 to 3 weeks.

## 2019-08-11 ENCOUNTER — Telehealth: Payer: Self-pay | Admitting: *Deleted

## 2019-08-11 NOTE — Telephone Encounter (Signed)
Spoke with Diane from [facility]. She states that patients insurance is denying for [facility] to continue her care. Pt is being discharged tomorrow.   Patient is going to use Boarder Foam dressing every 3 days if insurance does not denied. Otherwise will use regular gauze and change it everyday.   Made Diane aware to call the office if unable to change dressing herself so we can make a Nurse Visit.  Patient has a follow up appt with Dr. Lady Gary on 01/21.

## 2019-08-11 NOTE — Telephone Encounter (Signed)
Diane from [facility] called in regards to [employer] she stated that her insurance company denied coverage. She would like to talk to a nurse in regards to the patient, her number is [phone]

## 2019-08-20 ENCOUNTER — Encounter: Payer: BC Managed Care – PPO | Admitting: General Surgery

## 2019-08-25 ENCOUNTER — Encounter: Payer: Self-pay | Admitting: General Surgery

## 2019-08-25 ENCOUNTER — Ambulatory Visit (INDEPENDENT_AMBULATORY_CARE_PROVIDER_SITE_OTHER): Payer: BC Managed Care – PPO | Admitting: General Surgery

## 2019-08-25 ENCOUNTER — Other Ambulatory Visit: Payer: Self-pay

## 2019-08-25 VITALS — BP 155/98 | HR 103 | Temp 97.5°F | Resp 12 | Ht 65.0 in | Wt 224.2 lb

## 2019-08-25 DIAGNOSIS — L732 Hidradenitis suppurativa: Secondary | ICD-10-CM

## 2019-08-25 NOTE — Progress Notes (Signed)
Sara Foley is here today for a wound check.  She is a 54 year old woman who underwent wide local excision of severe hidradenitis of the posterior thigh.  She was treated with local wound care.  At her last visit, the area was healing nicely and beginning to epithelialize.  Today, she states that she is experiencing no pain and is able to sit without difficulty.  Today's Vitals   08/25/19 1459  BP: (!) 155/98  Pulse: (!) 103  Resp: 12  Temp: (!) 97.5 F (36.4 C)  TempSrc: Temporal  SpO2: 96%  Weight: 224 lb 3.2 oz (101.7 kg)  Height: 5\' 5"  (1.651 m)  PainSc: 0-No pain   Body mass index is 37.31 kg/m. Focused examination of the surgical site demonstrates that the wound has completely epithelialized.  There is a small amount of scar contracture present, but no woodiness or thickening.  Impression and plan: This is a 54 year old woman who had severe hidradenitis.  She has done well and her wound is now completely closed.  I have recommended that she apply moisturizer to the area daily.  I will see her on an as-needed basis.

## 2019-08-25 NOTE — Patient Instructions (Addendum)
Dr.Cannon advised patient she does not do any dressing changes or applying any bandages to the area. Advised patient to apply moisturizer to the area to help with the skin.  If patient has any questions or concerns, please give our office a call.  GENERAL POST-OPERATIVE PATIENT INSTRUCTIONS   FOLLOW-UP:  Please make an appointment with your physician. Call your physician immediately if you have any fevers greater than 102.5, drainage from you wound that is not clear or looks infected, persistent bleeding, increasing abdominal pain, problems urinating, or persistent nausea/vomiting.    WOUND CARE INSTRUCTIONS:  Keep a dry clean dressing on the wound if there is drainage. The initial bandage may be removed after 24 hours.  Once the wound has quit draining you may leave it open to air.  If clothing rubs against the wound or causes irritation and the wound is not draining you may cover it with a dry dressing during the daytime.  Try to keep the wound dry and avoid ointments on the wound unless directed to do so.  If the wound becomes bright red and painful or starts to drain infected material that is not clear, please contact your physician immediately.  If the wound is mildly pink and has a thick firm ridge underneath it, this is normal, and is referred to as a healing ridge.  This will resolve over the next 4-6 weeks.  DIET:  You may eat any foods that you can tolerate.  It is a good idea to eat a high fiber diet and take in plenty of fluids to prevent constipation.  If you do become constipated you may want to take a mild laxative or take ducolax tablets on a daily basis until your bowel habits are regular.  Constipation can be very uncomfortable, along with straining, after recent surgery.  ACTIVITY:  You are encouraged to cough and deep breath or use your incentive spirometer if you were given one, every 15-30 minutes when awake.  This will help prevent respiratory complications and low grade fevers  post-operatively if you had a general anesthetic.  You may want to hug a pillow when coughing and sneezing to add additional support to the surgical area, if you had abdominal or chest surgery, which will decrease pain during these times.  You are encouraged to walk and engage in light activity for the next two weeks.  You should not lift more than 20 pounds during this time frame as it could put you at increased risk for complications.  Twenty pounds is roughly equivalent to a plastic bag of groceries.    MEDICATIONS:  Try to take narcotic medications and anti-inflammatory medications, such as tylenol, ibuprofen, naprosyn, etc., with food.  This will minimize stomach upset from the medication.  Should you develop nausea and vomiting from the pain medication, or develop a rash, please discontinue the medication and contact your physician.  You should not drive, make important decisions, or operate machinery when taking narcotic pain medication.  QUESTIONS:  Please feel free to call your physician or the hospital operator if you have any questions, and they will be glad to assist you.

## 2019-11-04 ENCOUNTER — Other Ambulatory Visit: Payer: Self-pay | Admitting: Family Medicine

## 2019-11-04 DIAGNOSIS — I10 Essential (primary) hypertension: Secondary | ICD-10-CM

## 2019-11-16 ENCOUNTER — Other Ambulatory Visit: Payer: Self-pay | Admitting: Family Medicine

## 2019-11-16 DIAGNOSIS — Z1231 Encounter for screening mammogram for malignant neoplasm of breast: Secondary | ICD-10-CM

## 2019-11-20 ENCOUNTER — Ambulatory Visit
Admission: RE | Admit: 2019-11-20 | Discharge: 2019-11-20 | Disposition: A | Discharge status: Home / Self Care | Payer: BC Managed Care – PPO | Source: Ambulatory Visit | Attending: Family Medicine | Admitting: Family Medicine

## 2019-11-20 DIAGNOSIS — Z1231 Encounter for screening mammogram for malignant neoplasm of breast: Secondary | ICD-10-CM | POA: Insufficient documentation

## 2020-02-11 ENCOUNTER — Other Ambulatory Visit: Payer: Self-pay | Admitting: Family Medicine

## 2020-02-11 DIAGNOSIS — I10 Essential (primary) hypertension: Secondary | ICD-10-CM

## 2020-02-11 NOTE — Telephone Encounter (Signed)
Requested  medications are  due for refill today yes  Requested medications are on the active medication list yes  Last refill 4/8  Last visit Oct 2020  Future visit scheduled NO  Notes to clinic Failed protocol due to no visit within 6 months

## 2020-02-24 ENCOUNTER — Encounter: Payer: Self-pay | Admitting: Family Medicine

## 2020-02-24 ENCOUNTER — Other Ambulatory Visit: Payer: Self-pay

## 2020-02-24 ENCOUNTER — Ambulatory Visit: Payer: BC Managed Care – PPO | Admitting: Family Medicine

## 2020-02-24 VITALS — BP 130/70 | HR 90 | Temp 97.1°F | Resp 16 | Ht 65.0 in | Wt 228.8 lb

## 2020-02-24 DIAGNOSIS — E538 Deficiency of other specified B group vitamins: Secondary | ICD-10-CM | POA: Diagnosis not present

## 2020-02-24 DIAGNOSIS — E559 Vitamin D deficiency, unspecified: Secondary | ICD-10-CM

## 2020-02-24 DIAGNOSIS — Z1159 Encounter for screening for other viral diseases: Secondary | ICD-10-CM

## 2020-02-24 DIAGNOSIS — I10 Essential (primary) hypertension: Secondary | ICD-10-CM

## 2020-02-24 DIAGNOSIS — E786 Lipoprotein deficiency: Secondary | ICD-10-CM

## 2020-02-24 DIAGNOSIS — L732 Hidradenitis suppurativa: Secondary | ICD-10-CM

## 2020-02-24 DIAGNOSIS — R7303 Prediabetes: Secondary | ICD-10-CM

## 2020-02-24 MED ORDER — LOSARTAN POTASSIUM 100 MG PO TABS: 100.0000 mg | ORAL_TABLET | Freq: Every day | ORAL | 3 refills | Status: DC

## 2020-02-24 NOTE — Progress Notes (Signed)
Name: Sara Foley   MRN: [id number]    DOB: 07-Aug-1965   Date:02/24/2020       Progress Note  Subjective  Chief Complaint  Chief Complaint  Patient presents with  . Hypertension  . Medication Refill    HPI  HTN: bp is controlled with Losartan, denies side effects of medication, bp is at goal, no chest pain but she has intermittent palpitation not associated with sob, diaphoresis or nausea. Discussed when to go to [facility] for palpitation.   Hydradenitis suppurative: she had surgery done by Dr. Lady Gary Nov 2020.  Not having pain, she had complications after surgery, bleeding.   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   Vitamin D and B12 deficiency: not taking supplementation, explained need to check labs done.   Dyslipidemia: Low HDL, discussed increasing fish and tree nuts to her diet   Morbid obesity: she has a BMI of 38 with co-morbidities such as HTN, dyslipidemia and metabolic syndrome. Discussed importance of losing weight.   Patient Active Problem List   Diagnosis Date Noted  . Metabolic syndrome 02/28/2016  . Low HDL (under 40) 02/28/2016  . History of hysterectomy leaving cervix intact 01/19/2016  . Vitamin D deficiency 01/18/2016  . Vitamin B12 deficiency 01/18/2016  . Obesity (BMI 30.0-34.9) 01/17/2016  . History of gestational diabetes 01/17/2016  . Hypertension, benign 01/17/2016  . Hidradenitis suppurativa 01/17/2016  . History of iron deficiency anemia 01/17/2016  . Neck pain 01/17/2016    Past Surgical History:  Procedure Laterality Date  . ABDOMINAL HYSTERECTOMY  2012   partial  . BREAST BIOPSY Left 04-14-12   left breast stereotactic biopsy; fibroadenoma  . BREAST REDUCTION SURGERY  1999  . HYDRADENITIS EXCISION Left 06/05/2019   Procedure: EXCISION HIDRADENITIS GROIN;  Surgeon: Duanne Guess, MD;  Location: [facility] ORS;  Service: General;   Laterality: Left;  . PARTIAL HYSTERECTOMY  July 2013   due to fibroid tumors  . REDUCTION MAMMAPLASTY Bilateral 2000    Family History  Problem Relation Age of Onset  . Prostate cancer Other   . Colon cancer Maternal Uncle   . Diabetes Mother   . Arthritis/Rheumatoid Mother   . Leukemia Father     Social History   Tobacco Use  . Smoking status: Never Smoker  . Smokeless tobacco: Never Used  Substance Use Topics  . Alcohol use: Yes    Alcohol/week: 3.0 standard drinks    Types: 3 Standard drinks or equivalent per week     Current Outpatient Medications:  .  losartan (COZAAR) 100 MG tablet, Take 1 tablet by mouth once daily, Disp: 30 tablet, Rfl: 0 .  Cyanocobalamin (B-12) 1000 MCG LOZG, DISSOLVE ONE TABLET UNDER THE TONGUE DAILY (Patient not taking: Reported on 02/24/2020), Disp: 100 lozenge, Rfl: 0  No Known Allergies  I personally reviewed active problem list, medication list, allergies, family history, social history, health maintenance with the patient/caregiver today.   ROS  Constitutional: Negative for fever , positive for weight change.  Respiratory: Negative for cough and shortness of breath.   Cardiovascular: Negative for chest pain, but has intermittent  palpitations.  Gastrointestinal: Negative for abdominal pain, no bowel changes.  Musculoskeletal: Negative for gait problem or joint swelling.  Skin: Negative for rash.  Neurological: Negative for dizziness or headache.  No other specific complaints in a complete review of systems (except as listed in HPI above).  Objective  Vitals:   02/24/20 1335  BP: (!) 130/70  Pulse: 90  Resp: 16  Temp: (!) 97.1 F (36.2 C)  TempSrc: Temporal  SpO2: 95%  Weight: (!) 228 lb 12.8 oz (103.8 kg)  Height: 5\' 5"  (1.651 m)    Body mass index is 38.07 kg/m.  Physical Exam  Constitutional: Patient appears well-developed and well-nourished. Obese  No distress.  HEENT: head atraumatic, normocephalic, pupils equal  and reactive to light,  neck supple, throat within normal limits Cardiovascular: Normal rate, regular rhythm and normal heart sounds.  No murmur heard. No BLE edema. Pulmonary/Chest: Effort normal and breath sounds normal. No respiratory distress. Abdominal: Soft.  There is no tenderness. Psychiatric: Patient has a normal mood and affect. behavior is normal. Judgment and thought content normal.  PHQ2/9: Depression screen [facility] 2/9 02/24/2020 05/01/2019 01/28/2019 01/20/2019 10/14/2018  Decreased Interest 0 0 0 0 0  Down, Depressed, Hopeless 0 0 0 0 0  PHQ - 2 Score 0 0 0 0 0  Altered sleeping 0 0 0 0 0  Tired, decreased energy 0 0 0 0 0  Change in appetite 0 0 1 0 0  Feeling bad or failure about yourself  0 0 0 0 0  Trouble concentrating 0 0 1 0 0  Moving slowly or fidgety/restless 0 0 0 0 0  Suicidal thoughts 0 0 0 0 0  PHQ-9 Score 0 0 2 0 0  Difficult doing work/chores - - Not difficult at all Not difficult at all -    phq 9 is negative   Fall Risk: Fall Risk  02/24/2020 08/25/2019 07/28/2019 07/07/2019 06/30/2019  Falls in the past year? 0 0 0 0 0  Number falls in past yr: 0 0 0 - -  Injury with Fall? 0 0 0 - -     Functional Status Survey: Is the patient deaf or have difficulty hearing?: No Does the patient have difficulty seeing, even when wearing glasses/contacts?: No Does the patient have difficulty concentrating, remembering, or making decisions?: No Does the patient have difficulty walking or climbing stairs?: No Does the patient have difficulty dressing or bathing?: No Does the patient have difficulty doing errands alone such as visiting a doctor's office or shopping?: No    Assessment & Plan  1. Hypertension, benign  - CBC with Differential/Platelet - COMPLETE METABOLIC PANEL WITH GFR - losartan (COZAAR) 100 MG tablet; Take 1 tablet (100 mg total) by mouth daily.  Dispense: 90 tablet; Refill: 3  2. Morbid obesity ( HCC )   Discussed life style modification  3.  Hidradenitis suppurativa  Seen by Dr. 14/07/2018, had surgery done , she states she is feeling better   4. Vitamin B12 deficiency  - Vitamin B12  5. Pre-diabetes  - Hemoglobin A1c  6. Vitamin D deficiency  - VITAMIN D 25 Hydroxy (Vit-D Deficiency, Fractures)  7. Low HDL (under 40)  - Lipid panel  8. Need for hepatitis C screening test  - Hepatitis C antibody

## 2020-02-25 LAB — CBC WITH DIFFERENTIAL/PLATELET
Absolute Monocytes: 466 cells/uL (ref 200–950)
Basophils Absolute: 7 cells/uL (ref 0–200)
Basophils Relative: 0.1 %
Eosinophils Absolute: 148 cells/uL (ref 15–500)
Eosinophils Relative: 2 %
HCT: 40 % (ref 35.0–45.0)
Hemoglobin: 12.9 g/dL (ref 11.7–15.5)
Lymphs Abs: 1769 cells/uL (ref 850–3900)
MCH: 25.6 pg — ABNORMAL LOW (ref 27.0–33.0)
MCHC: 32.3 g/dL (ref 32.0–36.0)
MCV: 79.4 fL — ABNORMAL LOW (ref 80.0–100.0)
MPV: 10.4 fL (ref 7.5–12.5)
Monocytes Relative: 6.3 %
Neutro Abs: 5010 cells/uL (ref 1500–7800)
Neutrophils Relative %: 67.7 %
Platelets: 305 10*3/uL (ref 140–400)
RBC: 5.04 10*6/uL (ref 3.80–5.10)
RDW: 14.6 % (ref 11.0–15.0)
Total Lymphocyte: 23.9 %
WBC: 7.4 10*3/uL (ref 3.8–10.8)

## 2020-02-25 LAB — COMPLETE METABOLIC PANEL WITH GFR
AG Ratio: 1.1 (calc) (ref 1.0–2.5)
ALT: 19 U/L (ref 6–29)
AST: 19 U/L (ref 10–35)
Albumin: 4.2 g/dL (ref 3.6–5.1)
Alkaline phosphatase (APISO): 127 U/L (ref 37–153)
BUN: 11 mg/dL (ref 7–25)
CO2: 25 mmol/L (ref 20–32)
Calcium: 9 mg/dL (ref 8.6–10.4)
Chloride: 103 mmol/L (ref 98–110)
Creat: 0.64 mg/dL (ref 0.50–1.05)
GFR, Est African American: 119 mL/min/{1.73_m2} (ref >=60)
GFR, Est Non African American: 103 mL/min/{1.73_m2} (ref >=60)
Globulin: 3.8 g/dL (calc) — ABNORMAL HIGH (ref 1.9–3.7)
Glucose, Bld: 74 mg/dL (ref 65–99)
Potassium: 4 mmol/L (ref 3.5–5.3)
Sodium: 138 mmol/L (ref 135–146)
Total Bilirubin: 0.4 mg/dL (ref 0.2–1.2)
Total Protein: 8 g/dL (ref 6.1–8.1)

## 2020-02-25 LAB — LIPID PANEL
Cholesterol: 137 mg/dL (ref <200)
HDL: 37 mg/dL — ABNORMAL LOW (ref >=50)
LDL Cholesterol (Calc): 76 mg/dL (calc)
Non-HDL Cholesterol (Calc): 100 mg/dL (calc) (ref <130)
Total CHOL/HDL Ratio: 3.7 (calc) (ref <5.0)
Triglycerides: 143 mg/dL (ref <150)

## 2020-02-25 LAB — VITAMIN B12: Vitamin B-12: 378 pg/mL (ref 200–1100)

## 2020-02-25 LAB — HEPATITIS C ANTIBODY
Hepatitis C Ab: NONREACTIVE
SIGNAL TO CUT-OFF: 0.05 (ref <1.00)

## 2020-02-25 LAB — HEMOGLOBIN A1C
Hgb A1c MFr Bld: 6.6 % of total Hgb — ABNORMAL HIGH (ref <5.7)
Mean Plasma Glucose: 143 (calc)
eAG (mmol/L): 7.9 (calc)

## 2020-02-25 LAB — VITAMIN D 25 HYDROXY (VIT D DEFICIENCY, FRACTURES): Vit D, 25-Hydroxy: 19 ng/mL — ABNORMAL LOW (ref 30–100)

## 2020-03-02 ENCOUNTER — Other Ambulatory Visit: Payer: Self-pay | Admitting: Family Medicine

## 2020-03-02 MED ORDER — VITAMIN D (ERGOCALCIFEROL) 1.25 MG (50000 UNIT) PO CAPS: 50000.0000 [IU] | ORAL_CAPSULE | ORAL | 1 refills | Status: DC

## 2020-03-02 MED ORDER — THERA VITAL M PO TABS: 1.0000 | ORAL_TABLET | Freq: Every day | ORAL | 1 refills | Status: DC

## 2020-07-12 NOTE — Progress Notes (Signed)
Name: Sara Foley   MRN: [id number]    DOB: 08-Jul-1966   Date:07/13/2020       Progress Note  Subjective  Chief Complaint  Annual Exam  HPI  Patient presents for annual CPE and follow up  HTN: bp is controlled with Losartan 100 mg , denies side effects of medication, bp is at goal, no chest pain , states no palpitation since last visit with me  Hydradenitis suppurative: she had surgery done by Dr. Celine Ahr Nov 2020.  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   Vitamin D and B12 deficiency: not taking supplementation, we will recheck labs   Dyslipidemia: Low HDL, discussed increasing fish and tree nuts to her diet , we will recheck labs   Morbid obesity: she has a BMI over 35, with co-morbidities, HTN, dyslipidemia and metabolic syndrome.    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 having school difficulties   [place] Visit from 05/01/2019 in [facility]  AUDIT-C Score 2     Depression: Phq 9 is  negative Depression screen [facility] 2/9 07/13/2020 02/24/2020 05/01/2019 01/28/2019 01/20/2019  Decreased Interest 0 0 0 0 0  Down, Depressed, Hopeless 0 0 0 0 0  PHQ - 2 Score 0 0 0 0 0  Altered sleeping 1 0 0 0 0  Tired, decreased energy 1 0 0 0 0  Change in appetite 0 0 0 1 0  Feeling bad or failure about yourself  0 0 0 0 0  Trouble concentrating 0 0 0 1 0  Moving slowly or fidgety/restless 0 0 0 0 0  Suicidal thoughts 0 0 0 0 0  PHQ-9 Score 2 0 0 2 0  Difficult doing work/chores - - - Not difficult at all Not difficult at all   Hypertension: BP Readings from Last 3 Encounters:  07/13/20 136/80  02/24/20 (!) 130/70  08/25/19 (!) 155/98   Obesity: Wt  Readings from Last 3 Encounters:  07/13/20 223 lb 9.6 oz (101.4 kg)  02/24/20 (!) 228 lb 12.8 oz (103.8 kg)  08/25/19 224 lb 3.2 oz (101.7 kg)   BMI Readings from Last 3 Encounters:  07/13/20 37.21 kg/m  02/24/20 38.07 kg/m  08/25/19 37.31 kg/m     Vaccines:   Shingrix: 53-64 yo and ask insurance if covered when patient above 69 yo.  Pneumonia:  educated and discussed with patient. Flu: patient refused   Hep C Screening: 02/24/2020 STD testing and prevention (HIV/chl/gon/syphilis): not sexually active since last pap smear  Intimate partner violence: negative screen  Sexual History : not currently sexually active  Menstrual History/LMP/Abnormal Bleeding: s/p supra cervical hysterectomy  Incontinence Symptoms: no problems   Breast cancer:  - Last Mammogram: 11/20/2019 - BRCA gene screening: N/A  Osteoporosis: Discussed high calcium and vitamin D supplementation, weight bearing exercises  Cervical cancer screening: 01/28/2019  Skin cancer: Discussed monitoring for atypical lesions  Colorectal cancer: 11/11/2018  Lung cancer:   Low Dose CT Chest recommended if Age 53-80 years, 62 pack-year currently smoking OR have quit w/in 15years. Patient does not qualify.   ECG: 06/02/2019  Advanced Care Planning: A voluntary discussion about advance care planning including the explanation and discussion  of advance directives.  Discussed health care proxy and Living will, and the patient was able to identify a health care proxy as mother and oldest daughter [name]  .  Patient does have a living will at present time. If patient does have living will, I have requested they bring this to the clinic to be scanned in to their chart.  Lipids: Lab Results  Component Value Date   CHOL 137 02/24/2020   CHOL 143 10/14/2018   CHOL 127 01/07/2017   Lab Results  Component Value Date   HDL 37 (L) 02/24/2020   HDL 41 (L) 10/14/2018   HDL 42 (L) 01/07/2017   Lab Results  Component Value Date    LDLCALC 76 02/24/2020   LDLCALC 84 10/14/2018   LDLCALC 73 01/07/2017   Lab Results  Component Value Date   TRIG 143 02/24/2020   TRIG 88 10/14/2018   TRIG 58 01/07/2017   Lab Results  Component Value Date   CHOLHDL 3.7 02/24/2020   CHOLHDL 3.5 10/14/2018   CHOLHDL 3.0 01/07/2017   No results found for: LDLDIRECT  Glucose: Glucose, Bld  Date Value Ref Range Status  02/24/2020 74 65 - 99 mg/dL Final    Comment:    .            Fasting reference interval .   10/14/2018 88 65 - 99 mg/dL Final    Comment:    .            Fasting reference interval .   07/17/2017 86 65 - 99 mg/dL Final    Comment:    .            Fasting reference interval .     Patient Active Problem List   Diagnosis Date Noted   Metabolic syndrome 95/28/4132   Low HDL (under 40) 02/28/2016   History of hysterectomy leaving cervix intact 01/19/2016   Vitamin D deficiency 01/18/2016   Vitamin B12 deficiency 01/18/2016   Obesity (BMI 30.0-34.9) 01/17/2016   History of gestational diabetes 01/17/2016   Hypertension, benign 01/17/2016   Hidradenitis suppurativa 01/17/2016   History of iron deficiency anemia 01/17/2016   Neck pain 01/17/2016    Past Surgical History:  Procedure Laterality Date   ABDOMINAL HYSTERECTOMY  2012   partial   BREAST BIOPSY Left 04-14-12   left breast stereotactic biopsy; fibroadenoma   BREAST REDUCTION SURGERY  1999   HYDRADENITIS EXCISION Left 06/05/2019   Procedure: EXCISION HIDRADENITIS GROIN;  Surgeon: Fredirick Maudlin, MD;  Location: [facility] ORS;  Service: General;  Laterality: Left;   PARTIAL HYSTERECTOMY  July 2013   due to fibroid tumors   REDUCTION MAMMAPLASTY Bilateral 2000    Family History  Problem Relation Age of Onset   Prostate cancer Other    Colon cancer Maternal Uncle    Diabetes Mother    Arthritis/Rheumatoid Mother    Leukemia Father     Social History   Socioeconomic History   Marital status: Divorced     Spouse name: Not on file   Number of children: 3   Years of education: Not on file   Highest education level: Not on file  Occupational History   Not on file  Tobacco Use   Smoking status: Never Smoker   Smokeless tobacco: Never Used  Vaping Use   Vaping Use: Never used  Substance and Sexual Activity   Alcohol use: Yes    Alcohol/week: 3.0 standard drinks    Types: 3 Standard  drinks or equivalent per week   Drug use: No   Sexual activity: Not Currently  Other Topics Concern   Not on file  Social History Narrative   Not on file   Social Determinants of Health   Financial Resource Strain: Medium Risk   Difficulty of Paying Living Expenses: Somewhat hard  Food Insecurity: No Food Insecurity   Worried About Running Out of Food in the Last Year: Never true   Ran Out of Food in the Last Year: Never true  Transportation Needs: No Transportation Needs   Lack of Transportation (Medical): No   Lack of Transportation (Non-Medical): No  Physical Activity: Insufficiently Active   Days of Exercise per Week: 1 day   Minutes of Exercise per Session: 10 min  Stress: Stress Concern Present   Feeling of Stress : Rather much  Social Connections: Moderately Integrated   Frequency of Communication with Friends and Family: More than three times a week   Frequency of Social Gatherings with Friends and Family: Twice a week   Attends Religious Services: More than 4 times per year   Active Member of [employer] or Organizations: Yes   Attends Music therapist: More than 4 times per year   Marital Status: Divorced  Human resources officer Violence: Unknown   Fear of Current or Ex-Partner: Patient refused   Emotionally Abused: Patient refused   Physically Abused: Patient refused   Sexually Abused: Patient refused     Current Outpatient Medications:    losartan (COZAAR) 100 MG tablet, Take 1 tablet (100 mg total) by mouth daily., Disp: 90 tablet, Rfl: 3    Vitamin D, Ergocalciferol, (DRISDOL) 1.25 MG (50000 UNIT) CAPS capsule, Take 1 capsule (50,000 Units total) by mouth every 7 (seven) days., Disp: 12 capsule, Rfl: 1   Cyanocobalamin (B-12) 1000 MCG LOZG, DISSOLVE ONE TABLET UNDER THE TONGUE DAILY (Patient not taking: No sig reported), Disp: 100 lozenge, Rfl: 0   Multiple Vitamins-Minerals (MULTIVITAMIN) tablet, Take 1 tablet by mouth daily. (Patient not taking: Reported on 07/13/2020), Disp: 100 tablet, Rfl: 1  No Known Allergies   ROS  Constitutional: Negative for fever , positive for mild weight change.  Respiratory: Negative for cough and shortness of breath.   Cardiovascular: Negative for chest pain or palpitations.  Gastrointestinal: Negative for abdominal pain, no bowel changes.  Musculoskeletal: Negative for gait problem or joint swelling.  Skin: Negative for rash.  Neurological: Negative for dizziness or headache.  No other specific complaints in a complete review of systems (except as listed in HPI above).  Objective  Vitals:   07/13/20 1438  BP: 136/80  Pulse: 100  Resp: 16  Temp: 98 F (36.7 C)  TempSrc: Oral  SpO2: 98%  Weight: 223 lb 9.6 oz (101.4 kg)  Height: 5' 5"  (1.651 m)    Body mass index is 37.21 kg/m.  Physical Exam  Constitutional: Patient appears well-developed and well-nourished. No distress.  HENT: Head: Normocephalic and atraumatic. Ears: B TMs ok, no erythema or effusion; Nose: Nose normal. Mouth/Throat: Oropharynx is clear and moist. No oropharyngeal exudate.  Eyes: Conjunctivae and EOM are normal. Pupils are equal, round, and reactive to light. No scleral icterus.  Neck: Normal range of motion. Neck supple. No JVD present. No thyromegaly present.  Cardiovascular: Normal rate, regular rhythm and normal heart sounds.  No murmur heard. No BLE edema. Pulmonary/Chest: Effort normal and breath sounds normal. No respiratory distress. Abdominal: Soft. Bowel sounds are normal, no distension. There is  no tenderness. no  masses Breast: no lumps or masses, no nipple discharge or rashes FEMALE GENITALIA:  Not done  RECTAL: not done  Musculoskeletal: Normal range of motion, no joint effusions. No gross deformities Neurological: he is alert and oriented to person, place, and time. No cranial nerve deficit. Coordination, balance, strength, speech and gait are normal.  Skin: Skin is warm and dry. No rash noted. No erythema.  Psychiatric: Patient has a normal mood and affect. behavior is normal. Judgment and thought content normal.  Fall Risk: Fall Risk  07/13/2020 02/24/2020 08/25/2019 07/28/2019 07/07/2019  Falls in the past year? 0 0 0 0 0  Number falls in past yr: 0 0 0 0 -  Injury with Fall? 0 0 0 0 -     Functional Status Survey: Is the patient deaf or have difficulty hearing?: No Does the patient have difficulty seeing, even when wearing glasses/contacts?: No Does the patient have difficulty concentrating, remembering, or making decisions?: No Does the patient have difficulty walking or climbing stairs?: No Does the patient have difficulty dressing or bathing?: No Does the patient have difficulty doing errands alone such as visiting a doctor's office or shopping?: No   Assessment & Plan  1. Pre-diabetes  - Hemoglobin A1c  2. Hidradenitis suppurativa   3. Vitamin D deficiency   4. Well adult exam   5. Hypertension, benign  - CBC with Differential/Platelet - COMPLETE METABOLIC PANEL WITH GFR  6. Vitamin B12 deficiency  - Vitamin B12  7. Low HDL (under 40)  - Lipid panel  8. Morbid obesity ([place])  Discussed with the patient the risk posed by an increased BMI. Discussed importance of portion control, calorie counting and at least 150 minutes of physical activity weekly. Avoid sweet beverages and drink more water. Eat at least 6 servings of fruit and vegetables daily   9. Encounter for screening mammogram for malignant neoplasm of breast  - MM 3D SCREEN BREAST  BILATERAL; Future  10. Need for shingles vaccine  - Varicella-zoster vaccine IM  -USPSTF grade A and B recommendations reviewed with patient; age-appropriate recommendations, preventive care, screening tests, etc discussed and encouraged; healthy living encouraged; see AVS for patient education given to patient -Discussed importance of 150 minutes of physical activity weekly, eat two servings of fish weekly, eat one serving of tree nuts ( cashews, pistachios, pecans, almonds.Marland Kitchen) every other day, eat 6 servings of fruit/vegetables daily and drink plenty of water and avoid sweet beverages.

## 2020-07-13 ENCOUNTER — Encounter: Payer: Self-pay | Admitting: Family Medicine

## 2020-07-13 ENCOUNTER — Other Ambulatory Visit: Payer: Self-pay

## 2020-07-13 ENCOUNTER — Ambulatory Visit (INDEPENDENT_AMBULATORY_CARE_PROVIDER_SITE_OTHER): Payer: BC Managed Care – PPO | Admitting: Family Medicine

## 2020-07-13 DIAGNOSIS — Z0000 Encounter for general adult medical examination without abnormal findings: Secondary | ICD-10-CM | POA: Diagnosis not present

## 2020-07-13 DIAGNOSIS — E559 Vitamin D deficiency, unspecified: Secondary | ICD-10-CM

## 2020-07-13 DIAGNOSIS — I10 Essential (primary) hypertension: Secondary | ICD-10-CM

## 2020-07-13 DIAGNOSIS — R7303 Prediabetes: Secondary | ICD-10-CM

## 2020-07-13 DIAGNOSIS — L732 Hidradenitis suppurativa: Secondary | ICD-10-CM

## 2020-07-13 DIAGNOSIS — Z1231 Encounter for screening mammogram for malignant neoplasm of breast: Secondary | ICD-10-CM

## 2020-07-13 DIAGNOSIS — Z23 Encounter for immunization: Secondary | ICD-10-CM

## 2020-07-13 DIAGNOSIS — E786 Lipoprotein deficiency: Secondary | ICD-10-CM

## 2020-07-13 DIAGNOSIS — E538 Deficiency of other specified B group vitamins: Secondary | ICD-10-CM

## 2020-07-13 NOTE — Patient Instructions (Signed)

## 2020-07-20 LAB — CBC WITH DIFFERENTIAL/PLATELET
Absolute Monocytes: 318 cells/uL (ref 200–950)
Basophils Absolute: 21 cells/uL (ref 0–200)
Basophils Relative: 0.4 %
Eosinophils Absolute: 212 cells/uL (ref 15–500)
Eosinophils Relative: 4 %
HCT: 38.7 % (ref 35.0–45.0)
Hemoglobin: 12.4 g/dL (ref 11.7–15.5)
Lymphs Abs: 1150 cells/uL (ref 850–3900)
MCH: 26 pg — ABNORMAL LOW (ref 27.0–33.0)
MCHC: 32 g/dL (ref 32.0–36.0)
MCV: 81.1 fL (ref 80.0–100.0)
MPV: 10.6 fL (ref 7.5–12.5)
Monocytes Relative: 6 %
Neutro Abs: 3599 cells/uL (ref 1500–7800)
Neutrophils Relative %: 67.9 %
Platelets: 308 10*3/uL (ref 140–400)
RBC: 4.77 10*6/uL (ref 3.80–5.10)
RDW: 13.7 % (ref 11.0–15.0)
Total Lymphocyte: 21.7 %
WBC: 5.3 10*3/uL (ref 3.8–10.8)

## 2020-07-20 LAB — COMPLETE METABOLIC PANEL WITH GFR
AG Ratio: 1.1 (calc) (ref 1.0–2.5)
ALT: 17 U/L (ref 6–29)
AST: 16 U/L (ref 10–35)
Albumin: 3.8 g/dL (ref 3.6–5.1)
Alkaline phosphatase (APISO): 114 U/L (ref 37–153)
BUN: 13 mg/dL (ref 7–25)
CO2: 29 mmol/L (ref 20–32)
Calcium: 9 mg/dL (ref 8.6–10.4)
Chloride: 106 mmol/L (ref 98–110)
Creat: 0.57 mg/dL (ref 0.50–1.05)
GFR, Est African American: 124 mL/min/{1.73_m2} (ref >=60)
GFR, Est Non African American: 107 mL/min/{1.73_m2} (ref >=60)
Globulin: 3.4 g/dL (calc) (ref 1.9–3.7)
Glucose, Bld: 111 mg/dL — ABNORMAL HIGH (ref 65–99)
Potassium: 4.5 mmol/L (ref 3.5–5.3)
Sodium: 142 mmol/L (ref 135–146)
Total Bilirubin: 0.3 mg/dL (ref 0.2–1.2)
Total Protein: 7.2 g/dL (ref 6.1–8.1)

## 2020-07-20 LAB — LIPID PANEL
Cholesterol: 124 mg/dL (ref <200)
HDL: 39 mg/dL — ABNORMAL LOW (ref >=50)
LDL Cholesterol (Calc): 71 mg/dL (calc)
Non-HDL Cholesterol (Calc): 85 mg/dL (calc) (ref <130)
Total CHOL/HDL Ratio: 3.2 (calc) (ref <5.0)
Triglycerides: 67 mg/dL (ref <150)

## 2020-07-20 LAB — HEMOGLOBIN A1C
Hgb A1c MFr Bld: 6.8 % of total Hgb — ABNORMAL HIGH (ref <5.7)
Mean Plasma Glucose: 148 mg/dL
eAG (mmol/L): 8.2 mmol/L

## 2020-07-20 LAB — VITAMIN B12: Vitamin B-12: 312 pg/mL (ref 200–1100)

## 2020-09-23 ENCOUNTER — Telehealth: Payer: Self-pay | Admitting: Family Medicine

## 2020-09-23 NOTE — Telephone Encounter (Signed)
Pt is calling to see dr Carlynn Purl today or Monday no available appt. Pt is having neck pain radiating to her scalp x 1 wk  and would like to know if dr Carlynn Purl would order xray

## 2020-09-23 NOTE — Telephone Encounter (Signed)
If needs an appt please advise where. Thanks

## 2020-09-26 NOTE — Telephone Encounter (Signed)
lvm for pt to return call °

## 2020-11-22 ENCOUNTER — Ambulatory Visit
Admission: RE | Admit: 2020-11-22 | Discharge: 2020-11-22 | Disposition: A | Discharge status: Home / Self Care | Payer: BC Managed Care – PPO | Source: Ambulatory Visit | Attending: Family Medicine | Admitting: Family Medicine

## 2020-11-22 ENCOUNTER — Other Ambulatory Visit: Payer: Self-pay

## 2020-11-22 DIAGNOSIS — Z1231 Encounter for screening mammogram for malignant neoplasm of breast: Secondary | ICD-10-CM | POA: Diagnosis not present

## 2021-01-17 ENCOUNTER — Ambulatory Visit: Payer: BC Managed Care – PPO | Admitting: Family Medicine

## 2021-02-23 NOTE — Progress Notes (Signed)
Name: Sara Foley   MRN: [id number]    DOB: June 27, 1966   Date:02/24/2021       Progress Note  Subjective  Chief Complaint  Follow Up  HPI  HTN: bp is controlled with Losartan 100 mg , denies side effects of medication, continue current medications. No chest pain, palpitation or sob.  Cough: she states never smoked, she states over the past year she noticed she has episodes of coughing, sometimes it is slightly productive in am, it is random. She also has heartburn and has to take Tums a couple of times a week, usually present at night when she reclines . Discussed life style modification since she does not want to take more pills at this time   Hydradenitis suppurative: she had surgery done by Dr. Lady Gary Nov 2020.  Not having pain at this time , she changed her soap and symptoms are stable   Diabetes in Obese: she had gestational diabetes , followed by many years of pre-diabetes, in July of 2021 A1C spiked to 6.6 % we discussed life style modifications but A1C went higher to 6.8 % , . Discussed associated HTN, dyslipidemia, and obesity. Discussed statin therapy, also medications ( she does not want to take injectables) we will try Metformin first and if side effects we can try Rybelsus. Discussed life style modifications.    Vitamin D and B12 deficiency: not taking supplementation, we will recheck labs    Dyslipidemia: Low HDL, discussed increasing fish and tree nuts to her diet , we will start her on low dose statin therapy   Morbid obesity: she has a BMI over 35, with co-morbidities, HTN, dyslipidemia and metabolic syndrome. Explained importance of losing weight.    Patient Active Problem List   Diagnosis Date Noted   Metabolic syndrome 02/28/2016   Low HDL (under 40) 02/28/2016   History of hysterectomy leaving cervix intact 01/19/2016   Vitamin D deficiency 01/18/2016   Vitamin B12 deficiency 01/18/2016   Obesity (BMI 30.0-34.9) 01/17/2016   History of gestational diabetes  01/17/2016   Hypertension, benign 01/17/2016   Hidradenitis suppurativa 01/17/2016   History of iron deficiency anemia 01/17/2016   Neck pain 01/17/2016    Past Surgical History:  Procedure Laterality Date   ABDOMINAL HYSTERECTOMY  2012   partial   BREAST BIOPSY Left 04-14-12   left breast stereotactic biopsy; fibroadenoma   BREAST REDUCTION SURGERY  1999   HYDRADENITIS EXCISION Left 06/05/2019   Procedure: EXCISION HIDRADENITIS GROIN;  Surgeon: Duanne Guess, MD;  Location: [facility] ORS;  Service: General;  Laterality: Left;   PARTIAL HYSTERECTOMY  July 2013   due to fibroid tumors   REDUCTION MAMMAPLASTY Bilateral 2000    Family History  Problem Relation Age of Onset   Prostate cancer Other    Colon cancer Maternal Uncle    Diabetes Mother    Arthritis/Rheumatoid Mother    Leukemia Father     Social History   Tobacco Use   Smoking status: Never   Smokeless tobacco: Never  Substance Use Topics   Alcohol use: Yes    Alcohol/week: 3.0 standard drinks    Types: 3 Standard drinks or equivalent per week     Current Outpatient Medications:    Cyanocobalamin (B-12) 1000 MCG LOZG, DISSOLVE ONE TABLET UNDER THE TONGUE DAILY, Disp: 100 lozenge, Rfl: 0   losartan (COZAAR) 100 MG tablet, Take 1 tablet (100 mg total) by mouth daily., Disp: 90 tablet, Rfl: 3   Multiple Vitamins-Minerals (MULTIVITAMIN) tablet, Take 1  tablet by mouth daily., Disp: 100 tablet, Rfl: 1   Vitamin D, Ergocalciferol, (DRISDOL) 1.25 MG (50000 UNIT) CAPS capsule, Take 1 capsule (50,000 Units total) by mouth every 7 (seven) days. (Patient not taking: Reported on 02/24/2021), Disp: 12 capsule, Rfl: 1  No Known Allergies  I personally reviewed active problem list, medication list, allergies, family history, social history, health maintenance with the patient/caregiver today.   ROS  Constitutional: Negative for fever or weight change.  Respiratory: positive  for cough but no shortness of breath.    Cardiovascular: Negative for chest pain or palpitations.  Gastrointestinal: Negative for abdominal pain, no bowel changes.  Musculoskeletal: Negative for gait problem or joint swelling.  Skin: Negative for rash.  Neurological: Negative for dizziness or headache.  No other specific complaints in a complete review of systems (except as listed in HPI above).   Objective  Vitals:   02/24/21 0917  BP: 134/76  Pulse: 92  Resp: 16  Temp: 98.1 F (36.7 C)  SpO2: 98%  Weight: 228 lb (103.4 kg)  Height: 5\' 5"  (1.651 m)    Body mass index is 37.94 kg/m.  Physical Exam  Constitutional: Patient appears well-developed and well-nourished. Obese  No distress.  HEENT: head atraumatic, normocephalic, pupils equal and reactive to light, neck supple Cardiovascular: Normal rate, regular rhythm and normal heart sounds.  No murmur heard. No BLE edema. Pulmonary/Chest: Effort normal and breath sounds normal. No respiratory distress. Abdominal: Soft.  There is no tenderness. Psychiatric: Patient has a normal mood and affect. behavior is normal. Judgment and thought content normal.    PHQ2/9: Depression screen [facility] 2/9 02/24/2021 07/13/2020 02/24/2020 05/01/2019 01/28/2019  Decreased Interest 0 0 0 0 0  Down, Depressed, Hopeless 0 0 0 0 0  PHQ - 2 Score 0 0 0 0 0  Altered sleeping - 1 0 0 0  Tired, decreased energy - 1 0 0 0  Change in appetite - 0 0 0 1  Feeling bad or failure about yourself  - 0 0 0 0  Trouble concentrating - 0 0 0 1  Moving slowly or fidgety/restless - 0 0 0 0  Suicidal thoughts - 0 0 0 0  PHQ-9 Score - 2 0 0 2  Difficult doing work/chores - - - - Not difficult at all    phq 9 is negative   Fall Risk: Fall Risk  02/24/2021 07/13/2020 02/24/2020 08/25/2019 07/28/2019  Falls in the past year? 0 0 0 0 0  Number falls in past yr: 0 0 0 0 0  Injury with Fall? 0 0 0 0 0    Functional Status Survey: Is the patient deaf or have difficulty hearing?: No Does the patient have  difficulty seeing, even when wearing glasses/contacts?: No Does the patient have difficulty concentrating, remembering, or making decisions?: No Does the patient have difficulty walking or climbing stairs?: No Does the patient have difficulty dressing or bathing?: No Does the patient have difficulty doing errands alone such as visiting a doctor's office or shopping?: No    Assessment & Plan  1. Diabetes mellitus type 2 in obese (HCC)  - POCT HgB A1C - metFORMIN (GLUCOPHAGE XR) 750 MG 24 hr tablet; Take 1 tablet (750 mg total) by mouth daily with breakfast.  Dispense: 90 tablet; Refill: 1 - POCT UA - Microalbumin  2. Low HDL (under 40)  - atorvastatin (LIPITOR) 10 MG tablet; Take 1 tablet (10 mg total) by mouth daily.  Dispense: 90 tablet; Refill: 1  3. Morbid obesity (HCC)  Discussed with the patient the risk posed by an increased BMI. Discussed importance of portion control, calorie counting and at least 150 minutes of physical activity weekly. Avoid sweet beverages and drink more water. Eat at least 6 servings of fruit and vegetables daily    4. Hypertension, benign  - losartan (COZAAR) 100 MG tablet; Take 1 tablet (100 mg total) by mouth daily.  Dispense: 90 tablet; Refill: 1  5. Vitamin B12 deficiency   6. Vitamin D deficiency  - Vitamin D, Ergocalciferol, (DRISDOL) 1.25 MG (50000 UNIT) CAPS capsule; Take 1 capsule (50,000 Units total) by mouth every 7 (seven) days.  Dispense: 12 capsule; Refill: 1  7. Hidradenitis suppurativa   8. Dyslipidemia due to type 2 diabetes mellitus (HCC)  - atorvastatin (LIPITOR) 10 MG tablet; Take 1 tablet (10 mg total) by mouth daily.  Dispense: 90 tablet; Refill: 1

## 2021-02-24 ENCOUNTER — Encounter: Payer: Self-pay | Admitting: Family Medicine

## 2021-02-24 ENCOUNTER — Ambulatory Visit: Payer: BC Managed Care – PPO | Admitting: Family Medicine

## 2021-02-24 ENCOUNTER — Other Ambulatory Visit: Payer: Self-pay

## 2021-02-24 VITALS — BP 134/76 | HR 92 | Temp 98.1°F | Resp 16 | Ht 65.0 in | Wt 228.0 lb

## 2021-02-24 DIAGNOSIS — E786 Lipoprotein deficiency: Secondary | ICD-10-CM | POA: Diagnosis not present

## 2021-02-24 DIAGNOSIS — E1169 Type 2 diabetes mellitus with other specified complication: Secondary | ICD-10-CM | POA: Diagnosis not present

## 2021-02-24 DIAGNOSIS — L732 Hidradenitis suppurativa: Secondary | ICD-10-CM

## 2021-02-24 DIAGNOSIS — I10 Essential (primary) hypertension: Secondary | ICD-10-CM | POA: Diagnosis not present

## 2021-02-24 DIAGNOSIS — E669 Obesity, unspecified: Secondary | ICD-10-CM | POA: Diagnosis not present

## 2021-02-24 DIAGNOSIS — E559 Vitamin D deficiency, unspecified: Secondary | ICD-10-CM

## 2021-02-24 DIAGNOSIS — E785 Hyperlipidemia, unspecified: Secondary | ICD-10-CM

## 2021-02-24 DIAGNOSIS — E538 Deficiency of other specified B group vitamins: Secondary | ICD-10-CM

## 2021-02-24 LAB — POCT GLYCOSYLATED HEMOGLOBIN (HGB A1C): Hemoglobin A1C: 6.8 % — AB (ref 4.0–5.6)

## 2021-02-24 LAB — MICROALBUMIN / CREATININE URINE RATIO
Creatinine, Urine: 89 mg/dL (ref 20–275)
Microalb Creat Ratio: 16 mcg/mg creat (ref <30)
Microalb, Ur: 1.4 mg/dL

## 2021-02-24 MED ORDER — ATORVASTATIN CALCIUM 10 MG PO TABS: 10.0000 mg | ORAL_TABLET | Freq: Every day | ORAL | 1 refills | Status: DC

## 2021-02-24 MED ORDER — VITAMIN D (ERGOCALCIFEROL) 1.25 MG (50000 UNIT) PO CAPS: 50000.0000 [IU] | ORAL_CAPSULE | ORAL | 1 refills | Status: DC

## 2021-02-24 MED ORDER — LOSARTAN POTASSIUM 100 MG PO TABS: 100.0000 mg | ORAL_TABLET | Freq: Every day | ORAL | 1 refills | Status: DC

## 2021-02-24 MED ORDER — METFORMIN HCL ER 750 MG PO TB24: 750.0000 mg | ORAL_TABLET | Freq: Every day | ORAL | 1 refills | Status: DC

## 2021-02-24 NOTE — Patient Instructions (Signed)
Food Choices for Gastroesophageal Reflux Disease, Adult °When you have gastroesophageal reflux disease (GERD), the foods you eat and your eating habits are very important. Choosing the right foods can help ease the discomfort of GERD. Consider working with a dietitian to help you make healthy food choices. °What are tips for following this plan? °Reading food labels °Look for foods that are low in saturated fat. Foods that have less than 5% of daily value (DV) of fat and 0 g of trans fats may help with your symptoms. °Cooking °Cook foods using methods other than frying. This may include baking, steaming, grilling, or broiling. These are all methods that do not need a lot of fat for cooking. °To add flavor, try to use herbs that are low in spice and acidity. °Meal planning ° °Choose healthy foods that are low in fat, such as fruits, vegetables, whole grains, low-fat dairy products, lean meats, fish, and poultry. °Eat frequent, small meals instead of three large meals each day. Eat your meals slowly, in a relaxed setting. Avoid bending over or lying down until 2-3 hours after eating. °Limit high-fat foods such as fatty meats or fried foods. °Limit your intake of fatty foods, such as oils, butter, and shortening. °Avoid the following as told by your health care provider: °Foods that cause symptoms. These may be different for different people. Keep a food diary to keep track of foods that cause symptoms. °Alcohol. °Drinking large amounts of liquid with meals. °Eating meals during the 2-3 hours before bed. °Lifestyle °Maintain a healthy weight. Ask your health care provider what weight is healthy for you. If you need to lose weight, work with your health care provider to do so safely. °Exercise for at least 30 minutes on 5 or more days each week, or as told by your health care provider. °Avoid wearing clothes that fit tightly around your waist and chest. °Do not use any products that contain nicotine or tobacco. These  products include cigarettes, chewing tobacco, and vaping devices, such as e-cigarettes. If you need help quitting, ask your health care provider. °Sleep with the head of your bed raised. Use a wedge under the mattress or blocks under the bed frame to raise the head of the bed. °Chew sugar-free gum after mealtimes. °What foods should I eat? °Eat a healthy, well-balanced diet of fruits, vegetables, whole grains, low-fat dairy products, lean meats, fish, and poultry. Each person is different. Foods that may trigger symptoms in one person may not trigger any symptoms in another person. Work with your health care provider to identify foods that are safe for you. °The items listed above may not be a complete list of recommended foods and beverages. Contact a dietitian for more information. °What foods should I avoid? °Limiting some of these foods may help manage the symptoms of GERD. Everyone is different. Consult a dietitian or your health care provider to help you identify the exact foods to avoid, if any. °Fruits °Any fruits prepared with added fat. Any fruits that cause symptoms. For some people this may include citrus fruits, such as oranges, grapefruit, pineapple, and lemons. °Vegetables °Deep-fried vegetables. French fries. Any vegetables prepared with added fat. Any vegetables that cause symptoms. For some people, this may include tomatoes and tomato products, chili peppers, onions and garlic, and horseradish. °Grains °Pastries or quick breads with added fat. °Meats and other proteins °High-fat meats, such as fatty beef or pork, hot dogs, ribs, ham, sausage, salami, and bacon. Fried meat or protein, including fried   fish and fried chicken. Nuts and nut butters, in large amounts. °Dairy °Whole milk and chocolate milk. Sour cream. Cream. Ice cream. Cream cheese. Milkshakes. °Fats and oils °Butter. Margarine. Shortening. Ghee. °Beverages °Coffee and tea, with or without caffeine. Carbonated beverages. Sodas. Energy  drinks. Fruit juice made with acidic fruits, such as orange or grapefruit. Tomato juice. Alcoholic drinks. °Sweets and desserts °Chocolate and cocoa. Donuts. °Seasonings and condiments °Pepper. Peppermint and spearmint. Added salt. Any condiments, herbs, or seasonings that cause symptoms. For some people, this may include curry, hot sauce, or vinegar-based salad dressings. °The items listed above may not be a complete list of foods and beverages to avoid. Contact a dietitian for more information. °Questions to ask your health care provider °Diet and lifestyle changes are usually the first steps that are taken to manage symptoms of GERD. If diet and lifestyle changes do not improve your symptoms, talk with your health care provider about taking medicines. °Where to find more information °[employer]: aboutgerd.org °Summary °When you have gastroesophageal reflux disease (GERD), food and lifestyle choices may be very helpful in easing the discomfort of GERD. °Eat frequent, small meals instead of three large meals each day. Eat your meals slowly, in a relaxed setting. Avoid bending over or lying down until 2-3 hours after eating. °Limit high-fat foods such as fatty meats or fried foods. °This information is not intended to replace advice given to you by your health care provider. Make sure you discuss any questions you have with your health care provider. °Document Revised: 01/25/2020 Document Reviewed: 01/25/2020 °Elsevier Patient Education © 2022 [employer] ° °

## 2021-05-01 IMAGING — MG MM DIGITAL SCREENING BILAT W/ TOMO AND CAD
8 series · 8 of 24 positions shown · non-contrast
Comparison: Previous exam(s).

CLINICAL DATA: Screening.

EXAM:
DIGITAL SCREENING BILATERAL MAMMOGRAM WITH TOMOSYNTHESIS AND CAD
TECHNIQUE: Bilateral screening digital craniocaudal and mediolateral oblique
mammograms were obtained. Bilateral screening digital breast
tomosynthesis was performed. The images were evaluated with
computer-aided detection.

[R MLO synth-2D]
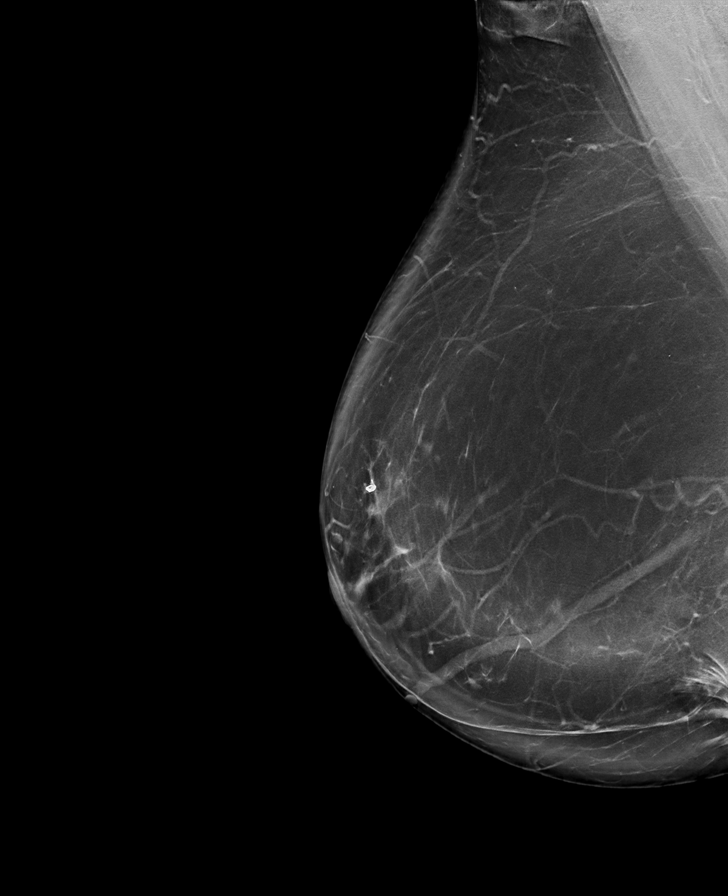

[L MLO synth-2D]
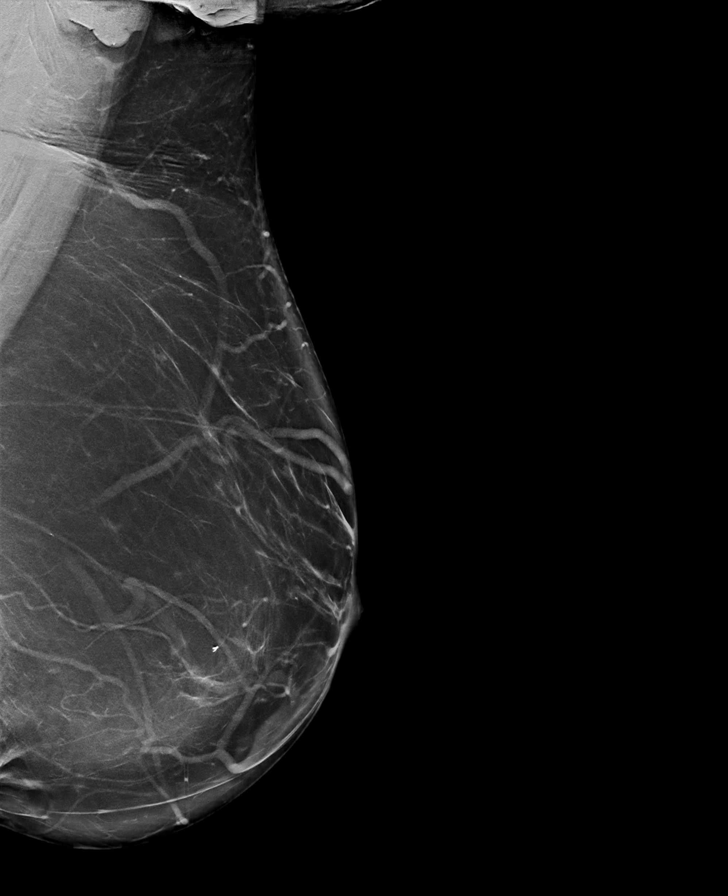

[L CC synth-2D]
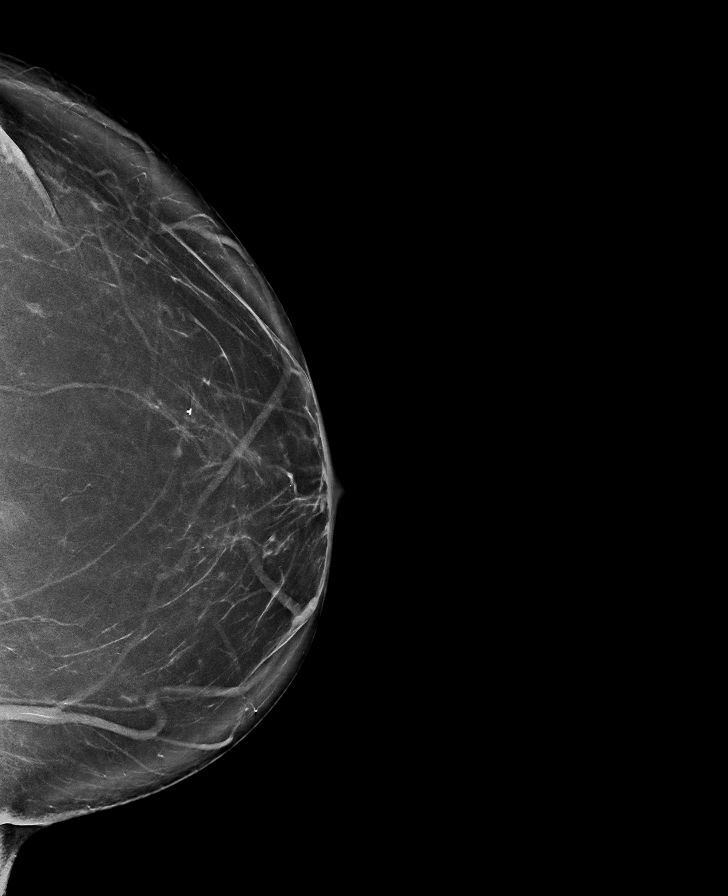

[R CC synth-2D]
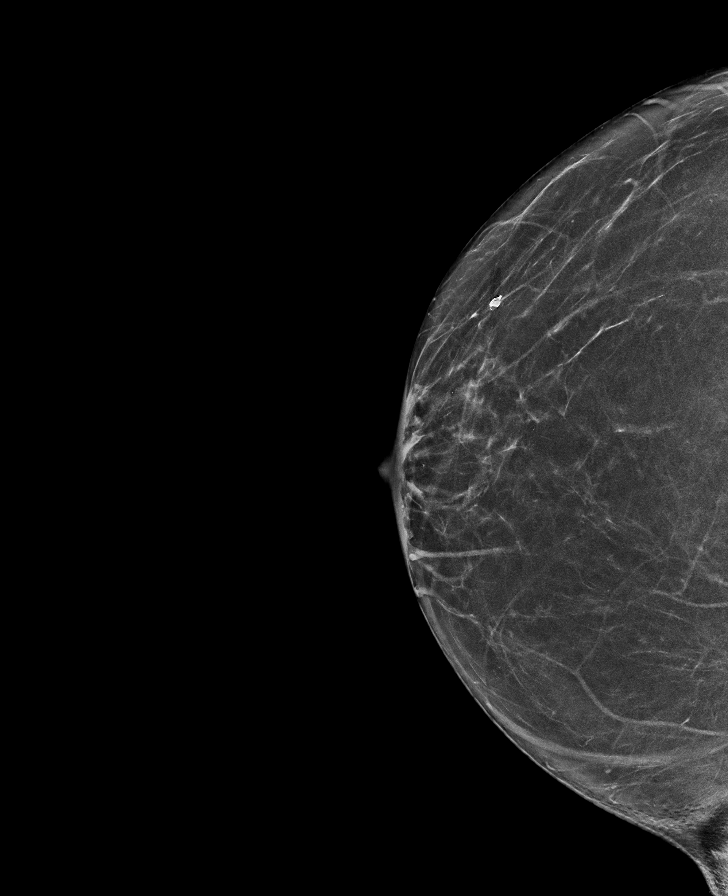

[L MLO tomo · tomo slice 49/98.0]
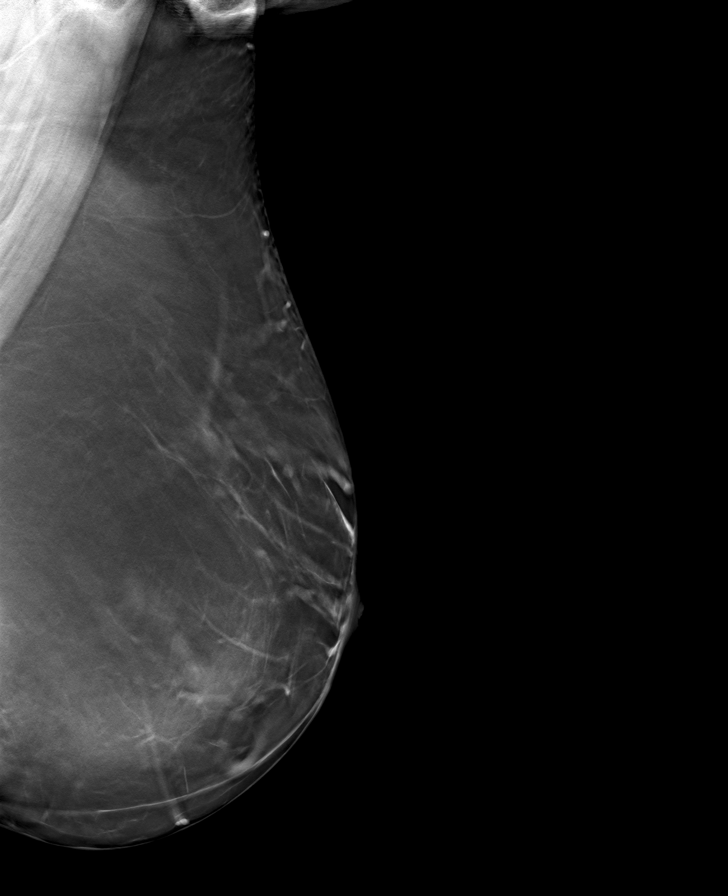

[L CC tomo · tomo slice 44/87.0]
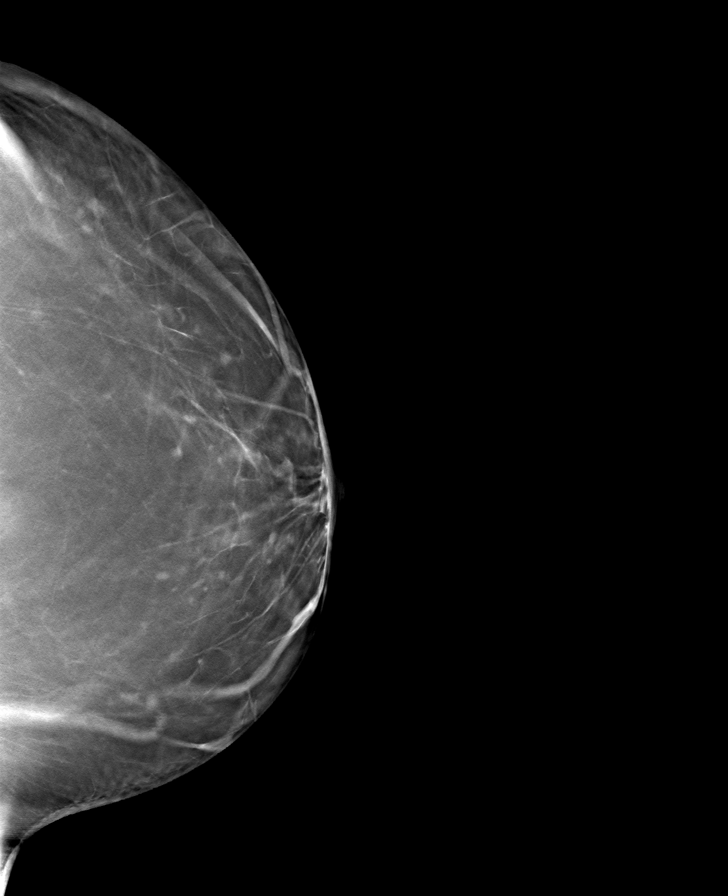

[R MLO tomo · tomo slice 54/107.0]
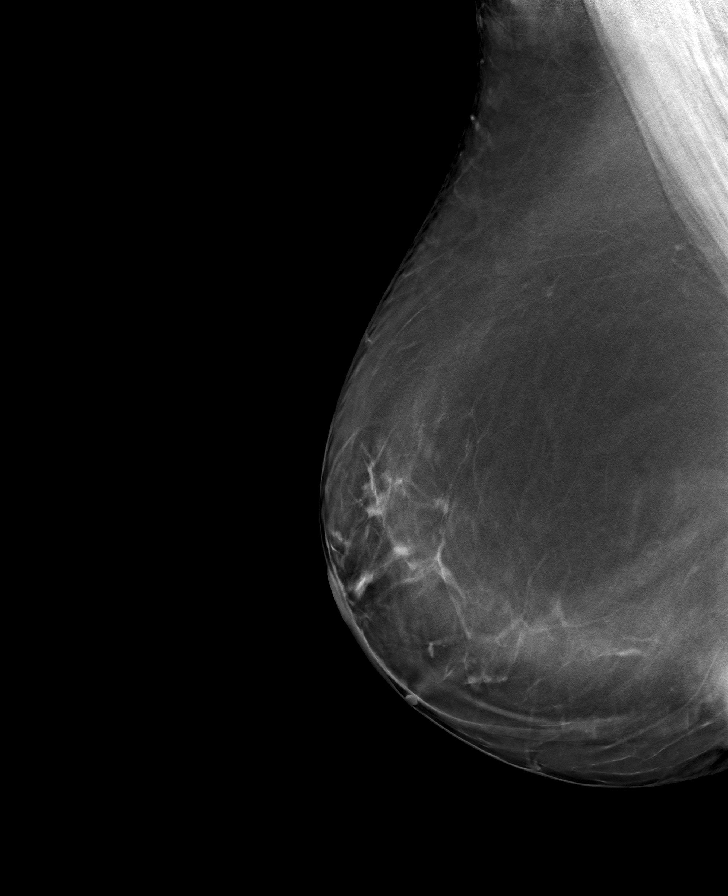

[R CC tomo · tomo slice 33/66.0]
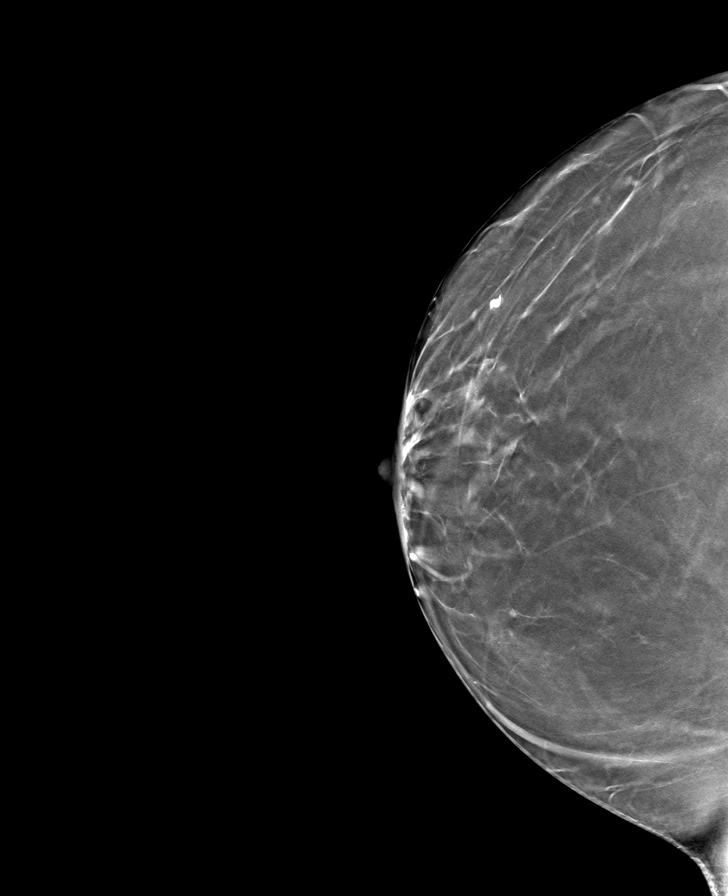

[8 of 24 positions shown; findings below may reference images not displayed]

ACR Breast Density Category b: There are scattered areas of
fibroglandular density.
FINDINGS: There are no findings suspicious for malignancy. The images were
evaluated with computer-aided detection.
IMPRESSION: No mammographic evidence of malignancy. A result letter of this
screening mammogram will be mailed directly to the patient.

RECOMMENDATION:
Screening mammogram in one year. (Code:[id number])

BI-RADS CATEGORY  1: Negative.

## 2021-05-08 ENCOUNTER — Encounter: Payer: Self-pay | Admitting: General Surgery

## 2021-06-20 NOTE — Progress Notes (Signed)
Name: Sara Foley   MRN: [id number]    DOB: [birth year]   Date:06/21/2021       Progress Note  Subjective  Chief Complaint  Follow up   HPI  HTN: bp is controlled with Losartan 100 mg , denies side effects of medication, continue current medications. No chest pain, palpitation, dizziness  or sob.   Hydradenitis suppurative: she had surgery done by Dr. Lady Gary Nov 2020.  Not having pain at this time , symptoms are stable.   Diabetes type II: she had gestational diabetes , followed by many years of pre-diabetes, in July of 2021 A1C spiked to 6.6 % we discussed life style modifications but A1C went higher to 6.8 % , . Discussed associated HTN, dyslipidemia, and obesity. Discussed statin therapy, she is now on Metfomin and A1C is down to 6.5 % She does not take it daily because it causes upset stomach, she states able to take it at night    Vitamin D and B12 deficiency: not taking supplementation, advised to resume medication    Dyslipidemia: Low HDL, discussed increasing fish and tree nuts to her diet , she is on low dose statin therapy    Morbid obesity: she has a BMI over 35, with co-morbidities, HTN, dyslipidemia and metabolic syndrome. She lost 7 lbs since last visit    Patient Active Problem List   Diagnosis Date Noted   Dyslipidemia due to type 2 diabetes mellitus (HCC) 06/21/2021   Metabolic syndrome 02/28/2016   Low HDL (under 40) 02/28/2016   History of hysterectomy leaving cervix intact 01/19/2016   Vitamin D deficiency 01/18/2016   Vitamin B12 deficiency 01/18/2016   History of gestational diabetes 01/17/2016   Hypertension, benign 01/17/2016   Hidradenitis suppurativa 01/17/2016   History of iron deficiency anemia 01/17/2016    Past Surgical History:  Procedure Laterality Date   ABDOMINAL HYSTERECTOMY  2012   partial   BREAST BIOPSY Left 04-14-12   left breast stereotactic biopsy; fibroadenoma   BREAST REDUCTION SURGERY  1999   HYDRADENITIS EXCISION Left 06/05/2019    Procedure: EXCISION HIDRADENITIS GROIN;  Surgeon: Duanne Guess, MD;  Location: [facility] ORS;  Service: General;  Laterality: Left;   PARTIAL HYSTERECTOMY  July 2013   due to fibroid tumors   REDUCTION MAMMAPLASTY Bilateral 2000    Family History  Problem Relation Age of Onset   Prostate cancer Other    Colon cancer Maternal Uncle    Diabetes Mother    Arthritis/Rheumatoid Mother    Leukemia Father     Social History   Tobacco Use   Smoking status: Never   Smokeless tobacco: Never  Substance Use Topics   Alcohol use: Yes    Alcohol/week: 3.0 standard drinks    Types: 3 Standard drinks or equivalent per week     Current Outpatient Medications:    Multiple Vitamins-Minerals (MULTIVITAMIN) tablet, Take 1 tablet by mouth daily., Disp: 100 tablet, Rfl: 1   atorvastatin (LIPITOR) 10 MG tablet, Take 1 tablet (10 mg total) by mouth daily., Disp: 90 tablet, Rfl: 1   Cyanocobalamin (B-12) 1000 MCG LOZG, DISSOLVE ONE TABLET UNDER THE TONGUE DAILY, Disp: 100 lozenge, Rfl: 0   losartan (COZAAR) 100 MG tablet, Take 1 tablet (100 mg total) by mouth daily., Disp: 90 tablet, Rfl: 1   metFORMIN (GLUCOPHAGE XR) 750 MG 24 hr tablet, Take 1 tablet (750 mg total) by mouth daily with breakfast., Disp: 90 tablet, Rfl: 1   Vitamin D, Ergocalciferol, (DRISDOL) 1.25 MG (50000  UNIT) CAPS capsule, Take 1 capsule (50,000 Units total) by mouth every 7 (seven) days., Disp: 12 capsule, Rfl: 1  No Known Allergies  I personally reviewed active problem list, medication list, allergies, family history, social history, health maintenance with the patient/caregiver today.   ROS  Constitutional: Negative for fever , positive for weight change.  Respiratory: Negative for cough and shortness of breath.   Cardiovascular: Negative for chest pain or palpitations.  Gastrointestinal: Negative for abdominal pain, no bowel changes.  Musculoskeletal: Negative for gait problem or joint swelling.  Skin: Negative for  rash.  Neurological: Negative for dizziness or headache.  No other specific complaints in a complete review of systems (except as listed in HPI above).   Objective  Vitals:   06/21/21 1003  BP: 118/70  Pulse: 98  Resp: 16  Temp: 98 F (36.7 C)  TempSrc: Oral  SpO2: 98%  Weight: 221 lb 12.8 oz (100.6 kg)  Height: 5\' 5"  (1.651 m)    Body mass index is 36.91 kg/m.  Physical Exam  Constitutional: Patient appears well-developed and well-nourished. Obese  No distress.  HEENT: head atraumatic, normocephalic, pupils equal and reactive to light, neck supple Cardiovascular: Normal rate, regular rhythm and normal heart sounds.  No murmur heard. No BLE edema. Pulmonary/Chest: Effort normal and breath sounds normal. No respiratory distress. Abdominal: Soft.  There is no tenderness. Psychiatric: Patient has a normal mood and affect. behavior is normal. Judgment and thought content normal.   Recent Results (from the past 2160 hour(s))  POCT HgB A1C     Status: Abnormal   Collection Time: 06/21/21 10:04 AM  Result Value Ref Range   Hemoglobin A1C 6.5 (A) 4.0 - 5.6 %   HbA1c POC (<> result, manual entry)     HbA1c, POC (prediabetic range)     HbA1c, POC (controlled diabetic range)      Diabetic Foot Exam: Diabetic Foot Exam - Simple   Simple Foot Form Diabetic Foot exam was performed with the following findings: Yes 06/21/2021 10:26 AM  Visual Inspection See comments: Yes Sensation Testing Intact to touch and monofilament testing bilaterally: Yes Pulse Check Posterior Tibialis and Dorsalis pulse intact bilaterally: Yes Comments Bunion and corn formation       PHQ2/9: Depression screen [facility] 2/9 06/21/2021 02/24/2021 07/13/2020 02/24/2020 05/01/2019  Decreased Interest 0 0 0 0 0  Down, Depressed, Hopeless 0 0 0 0 0  PHQ - 2 Score 0 0 0 0 0  Altered sleeping 0 - 1 0 0  Tired, decreased energy 0 - 1 0 0  Change in appetite 0 - 0 0 0  Feeling bad or failure about yourself  0 -  0 0 0  Trouble concentrating 0 - 0 0 0  Moving slowly or fidgety/restless 0 - 0 0 0  Suicidal thoughts 0 - 0 0 0  PHQ-9 Score 0 - 2 0 0  Difficult doing work/chores Not difficult at all - - - -    phq 9 is negative   Fall Risk: Fall Risk  06/21/2021 02/24/2021 07/13/2020 02/24/2020 08/25/2019  Falls in the past year? 0 0 0 0 0  Number falls in past yr: 0 0 0 0 0  Injury with Fall? 0 0 0 0 0  Risk for fall due to : No Fall Risks - - - -  Follow up Falls prevention discussed - - - -      Functional Status Survey: Is the patient deaf or have difficulty hearing?: No  Does the patient have difficulty seeing, even when wearing glasses/contacts?: Yes Does the patient have difficulty concentrating, remembering, or making decisions?: No Does the patient have difficulty walking or climbing stairs?: No Does the patient have difficulty dressing or bathing?: No Does the patient have difficulty doing errands alone such as visiting a doctor's office or shopping?: No    Assessment & Plan  1. Dyslipidemia due to type 2 diabetes mellitus (HCC)  - POCT HgB A1C - Lipid panel - atorvastatin (LIPITOR) 10 MG tablet; Take 1 tablet (10 mg total) by mouth daily.  Dispense: 90 tablet; Refill: 1  2. Hypertension, benign  - CBC with Differential/Platelet - COMPLETE METABOLIC PANEL WITH GFR - losartan (COZAAR) 100 MG tablet; Take 1 tablet (100 mg total) by mouth daily.  Dispense: 90 tablet; Refill: 1  3. Diabetes mellitus type 2 in obese (HCC)  - metFORMIN (GLUCOPHAGE XR) 750 MG 24 hr tablet; Take 1 tablet (750 mg total) by mouth daily with breakfast.  Dispense: 90 tablet; Refill: 1  4. Low HDL (under 40)  - atorvastatin (LIPITOR) 10 MG tablet; Take 1 tablet (10 mg total) by mouth daily.  Dispense: 90 tablet; Refill: 1  5. Vitamin B12 deficiency  - Vitamin B12 - Cyanocobalamin (B-12) 1000 MCG LOZG; DISSOLVE ONE TABLET UNDER THE TONGUE DAILY  Dispense: 100 lozenge; Refill: 0  6. Morbid obesity  (HCC)  Discussed with the patient the risk posed by an increased BMI. Discussed importance of portion control, calorie counting and at least 150 minutes of physical activity weekly. Avoid sweet beverages and drink more water. Eat at least 6 servings of fruit and vegetables daily    7. Vitamin D deficiency  - VITAMIN D 25 Hydroxy (Vit-D Deficiency, Fractures) - Vitamin D, Ergocalciferol, (DRISDOL) 1.25 MG (50000 UNIT) CAPS capsule; Take 1 capsule (50,000 Units total) by mouth every 7 (seven) days.  Dispense: 12 capsule; Refill: 1  8. Hidradenitis suppurativa   9. Need for shingles vaccine  - Varicella-zoster vaccine IM  10. Need for vaccination for pneumococcus  - Pneumococcal conjugate vaccine 20-valent (Prevnar 20)

## 2021-06-21 ENCOUNTER — Other Ambulatory Visit: Payer: Self-pay

## 2021-06-21 ENCOUNTER — Encounter: Payer: Self-pay | Admitting: Family Medicine

## 2021-06-21 ENCOUNTER — Ambulatory Visit: Payer: BC Managed Care – PPO | Admitting: Family Medicine

## 2021-06-21 VITALS — BP 118/70 | HR 98 | Temp 98.0°F | Resp 16 | Ht 65.0 in | Wt 221.8 lb

## 2021-06-21 DIAGNOSIS — Z23 Encounter for immunization: Secondary | ICD-10-CM

## 2021-06-21 DIAGNOSIS — E669 Obesity, unspecified: Secondary | ICD-10-CM

## 2021-06-21 DIAGNOSIS — E538 Deficiency of other specified B group vitamins: Secondary | ICD-10-CM

## 2021-06-21 DIAGNOSIS — L732 Hidradenitis suppurativa: Secondary | ICD-10-CM

## 2021-06-21 DIAGNOSIS — I10 Essential (primary) hypertension: Secondary | ICD-10-CM

## 2021-06-21 DIAGNOSIS — E1169 Type 2 diabetes mellitus with other specified complication: Secondary | ICD-10-CM | POA: Diagnosis not present

## 2021-06-21 DIAGNOSIS — E559 Vitamin D deficiency, unspecified: Secondary | ICD-10-CM

## 2021-06-21 DIAGNOSIS — E786 Lipoprotein deficiency: Secondary | ICD-10-CM | POA: Diagnosis not present

## 2021-06-21 DIAGNOSIS — E785 Hyperlipidemia, unspecified: Secondary | ICD-10-CM | POA: Diagnosis not present

## 2021-06-21 LAB — POCT GLYCOSYLATED HEMOGLOBIN (HGB A1C): Hemoglobin A1C: 6.5 % — AB (ref 4.0–5.6)

## 2021-06-21 MED ORDER — ATORVASTATIN CALCIUM 10 MG PO TABS: 10.0000 mg | ORAL_TABLET | Freq: Every day | ORAL | 1 refills | Status: DC

## 2021-06-21 MED ORDER — VITAMIN D (ERGOCALCIFEROL) 1.25 MG (50000 UNIT) PO CAPS: 50000.0000 [IU] | ORAL_CAPSULE | ORAL | 1 refills | Status: DC

## 2021-06-21 MED ORDER — B-12 1000 MCG PO LOZG: LOZENGE | ORAL | 0 refills | Status: DC

## 2021-06-21 MED ORDER — METFORMIN HCL ER 750 MG PO TB24: 750.0000 mg | ORAL_TABLET | Freq: Every day | ORAL | 1 refills | Status: DC

## 2021-06-21 MED ORDER — LOSARTAN POTASSIUM 100 MG PO TABS
100.0000 mg | ORAL_TABLET | Freq: Every day | ORAL | 1 refills | Status: DC
Start: 2021-06-21 — End: 2021-12-20

## 2021-06-22 LAB — CBC WITH DIFFERENTIAL/PLATELET
Absolute Monocytes: 319 cells/uL (ref 200–950)
Basophils Absolute: 11 cells/uL (ref 0–200)
Basophils Relative: 0.2 %
Eosinophils Absolute: 143 cells/uL (ref 15–500)
Eosinophils Relative: 2.6 %
HCT: 39.9 % (ref 35.0–45.0)
Hemoglobin: 12.8 g/dL (ref 11.7–15.5)
Lymphs Abs: 1221 cells/uL (ref 850–3900)
MCH: 26.3 pg — ABNORMAL LOW (ref 27.0–33.0)
MCHC: 32.1 g/dL (ref 32.0–36.0)
MCV: 82.1 fL (ref 80.0–100.0)
MPV: 10.6 fL (ref 7.5–12.5)
Monocytes Relative: 5.8 %
Neutro Abs: 3806 cells/uL (ref 1500–7800)
Neutrophils Relative %: 69.2 %
Platelets: 293 10*3/uL (ref 140–400)
RBC: 4.86 10*6/uL (ref 3.80–5.10)
RDW: 13.3 % (ref 11.0–15.0)
Total Lymphocyte: 22.2 %
WBC: 5.5 10*3/uL (ref 3.8–10.8)

## 2021-06-22 LAB — COMPLETE METABOLIC PANEL WITH GFR
AG Ratio: 1.1 (calc) (ref 1.0–2.5)
ALT: 18 U/L (ref 6–29)
AST: 16 U/L (ref 10–35)
Albumin: 3.9 g/dL (ref 3.6–5.1)
Alkaline phosphatase (APISO): 119 U/L (ref 37–153)
BUN: 11 mg/dL (ref 7–25)
CO2: 29 mmol/L (ref 20–32)
Calcium: 8.8 mg/dL (ref 8.6–10.4)
Chloride: 104 mmol/L (ref 98–110)
Creat: 0.68 mg/dL (ref 0.50–1.03)
Globulin: 3.5 g/dL (calc) (ref 1.9–3.7)
Glucose, Bld: 104 mg/dL — ABNORMAL HIGH (ref 65–99)
Potassium: 4.3 mmol/L (ref 3.5–5.3)
Sodium: 138 mmol/L (ref 135–146)
Total Bilirubin: 0.5 mg/dL (ref 0.2–1.2)
Total Protein: 7.4 g/dL (ref 6.1–8.1)
eGFR: 104 mL/min/{1.73_m2} (ref >=60)

## 2021-06-22 LAB — LIPID PANEL
Cholesterol: 113 mg/dL (ref <200)
HDL: 32 mg/dL — ABNORMAL LOW (ref >=50)
LDL Cholesterol (Calc): 66 mg/dL (calc)
Non-HDL Cholesterol (Calc): 81 mg/dL (calc) (ref <130)
Total CHOL/HDL Ratio: 3.5 (calc) (ref <5.0)
Triglycerides: 70 mg/dL (ref <150)

## 2021-06-22 LAB — VITAMIN D 25 HYDROXY (VIT D DEFICIENCY, FRACTURES): Vit D, 25-Hydroxy: 29 ng/mL — ABNORMAL LOW (ref 30–100)

## 2021-06-22 LAB — VITAMIN B12: Vitamin B-12: 427 pg/mL (ref 200–1100)

## 2021-07-21 NOTE — Progress Notes (Signed)
Name: Sara Foley   MRN: [id number]    DOB: Jan 13, 1966   Date:07/25/2021       Progress Note  Subjective  Chief Complaint  Annual Exam  HPI  Patient presents for annual CPE.  Diet: cutting down on junk food.  Exercise: discussed importance of 150 minutes    [place] Visit from 05/01/2019 in [facility]  AUDIT-C Score 2      Depression: Phq 9 is  negative Depression screen [facility] 2/9 07/25/2021 06/21/2021 02/24/2021 07/13/2020 02/24/2020  Decreased Interest 0 0 0 0 0  Down, Depressed, Hopeless 0 0 0 0 0  PHQ - 2 Score 0 0 0 0 0  Altered sleeping 0 0 - 1 0  Tired, decreased energy 0 0 - 1 0  Change in appetite 0 0 - 0 0  Feeling bad or failure about yourself  0 0 - 0 0  Trouble concentrating 0 0 - 0 0  Moving slowly or fidgety/restless 0 0 - 0 0  Suicidal thoughts 0 0 - 0 0  PHQ-9 Score 0 0 - 2 0  Difficult doing work/chores - Not difficult at all - - -  Some recent data might be hidden   Hypertension: BP Readings from Last 3 Encounters:  07/25/21 136/84  06/21/21 118/70  02/24/21 134/76   Obesity: Wt Readings from Last 3 Encounters:  07/25/21 221 lb (100.2 kg)  06/21/21 221 lb 12.8 oz (100.6 kg)  02/24/21 228 lb (103.4 kg)   BMI Readings from Last 3 Encounters:  07/25/21 36.78 kg/m  06/21/21 36.91 kg/m  02/24/21 37.94 kg/m     Vaccines:   Shingrix: up to date COVID-19: she only had two doses, discussed booster  Pneumonia: educated and discussed with patient. Flu: educated and discussed with patient.  Hep C Screening: 02/24/20 STD testing and prevention (HIV/chl/gon/syphilis): 01/17/16 Intimate partner violence: negative Sexual History :not sexually active for a long time Menstrual History/LMP/Abnormal Bleeding: s/p hysterectomy  Incontinence Symptoms: no problems.   Breast cancer:  - Last Mammogram: 11/22/20 - BRCA gene screening: N/A  Osteoporosis prevention : Discussed high calcium and vitamin D supplementation,  weight bearing exercises  Cervical cancer screening: 01/28/19  Skin cancer: Discussed monitoring for atypical lesions  Colorectal cancer: 11/11/18   Lung cancer: Low Dose CT Chest recommended if Age 60-80 years, 20 pack-year currently smoking OR have quit w/in 15years. Patient does not qualify.   ECG: 06/02/19  Advanced Care Planning: A voluntary discussion about advance care planning including the explanation and discussion of advance directives.  Discussed health care proxy and Living will, and the patient was able to identify a health care proxy as Wilba Mutz - daughter  .  Patient does not have a living will at present time. If patient does have living will, I have requested they bring this to the clinic to be scanned in to their chart.  Lipids: Lab Results  Component Value Date   CHOL 113 06/21/2021   CHOL 124 07/19/2020   CHOL 137 02/24/2020   Lab Results  Component Value Date   HDL 32 (L) 06/21/2021   HDL 39 (L) 07/19/2020   HDL 37 (L) 02/24/2020   Lab Results  Component Value Date   LDLCALC 66 06/21/2021   LDLCALC 71 07/19/2020   LDLCALC 76 02/24/2020   Lab Results  Component Value Date   TRIG 70 06/21/2021   TRIG 67 07/19/2020   TRIG 143 02/24/2020   Lab Results  Component Value  Date   CHOLHDL 3.5 06/21/2021   CHOLHDL 3.2 07/19/2020   CHOLHDL 3.7 02/24/2020   No results found for: LDLDIRECT  Glucose: Glucose, Bld  Date Value Ref Range Status  06/21/2021 104 (H) 65 - 99 mg/dL Final    Comment:    .            Fasting reference interval . For someone without known diabetes, a glucose value between 100 and 125 mg/dL is consistent with prediabetes and should be confirmed with a follow-up test. .   07/19/2020 111 (H) 65 - 99 mg/dL Final    Comment:    .            Fasting reference interval . For someone without known diabetes, a glucose value between 100 and 125 mg/dL is consistent with prediabetes and should be confirmed with a follow-up test. .    02/24/2020 74 65 - 99 mg/dL Final    Comment:    .            Fasting reference interval .     Patient Active Problem List   Diagnosis Date Noted   Dyslipidemia due to type 2 diabetes mellitus ([place]) 44/09/4740   Metabolic syndrome 59/56/3875   Low HDL (under 40) 02/28/2016   History of hysterectomy leaving cervix intact 01/19/2016   Vitamin D deficiency 01/18/2016   Vitamin B12 deficiency 01/18/2016   History of gestational diabetes 01/17/2016   Hypertension, benign 01/17/2016   Hidradenitis suppurativa 01/17/2016   History of iron deficiency anemia 01/17/2016    Past Surgical History:  Procedure Laterality Date   ABDOMINAL HYSTERECTOMY  2012   partial   BREAST BIOPSY Left 04-14-12   left breast stereotactic biopsy; fibroadenoma   BREAST REDUCTION SURGERY  1999   HYDRADENITIS EXCISION Left 06/05/2019   Procedure: EXCISION HIDRADENITIS GROIN;  Surgeon: Fredirick Maudlin, MD;  Location: [facility] ORS;  Service: General;  Laterality: Left;   PARTIAL HYSTERECTOMY  July 2013   due to fibroid tumors   REDUCTION MAMMAPLASTY Bilateral 2000    Family History  Problem Relation Age of Onset   Prostate cancer Other    Colon cancer Maternal Uncle    Diabetes Mother    Arthritis/Rheumatoid Mother    Leukemia Father     Social History   Socioeconomic History   Marital status: Divorced    Spouse name: Not on file   Number of children: 3   Years of education: Not on file   Highest education level: Not on file  Occupational History   Not on file  Tobacco Use   Smoking status: Never   Smokeless tobacco: Never  Vaping Use   Vaping Use: Never used  Substance and Sexual Activity   Alcohol use: Yes    Alcohol/week: 3.0 standard drinks    Types: 3 Standard drinks or equivalent per week   Drug use: No   Sexual activity: Not Currently  Other Topics Concern   Not on file  Social History Narrative   Not on file   Social Determinants of Health   Financial Resource Strain: Low  Risk    Difficulty of Paying Living Expenses: Not hard at all  Food Insecurity: No Food Insecurity   Worried About Charity fundraiser in the Last Year: Never true   [place] in the Last Year: Never true  Transportation Needs: No Transportation Needs   Lack of Transportation (Medical): No   Lack of Transportation (Non-Medical): No  Physical  Activity: Insufficiently Active   Days of Exercise per Week: 1 day   Minutes of Exercise per Session: 10 min  Stress: Stress Concern Present   Feeling of Stress : To some extent  Social Connections: Moderately Integrated   Frequency of Communication with Friends and Family: More than three times a week   Frequency of Social Gatherings with Friends and Family: More than three times a week   Attends Religious Services: More than 4 times per year   Active Member of [employer] or Organizations: Yes   Attends Music therapist: More than 4 times per year   Marital Status: Divorced  Human resources officer Violence: Not At Risk   Fear of Current or Ex-Partner: No   Emotionally Abused: No   Physically Abused: No   Sexually Abused: No     Current Outpatient Medications:    atorvastatin (LIPITOR) 10 MG tablet, Take 1 tablet (10 mg total) by mouth daily., Disp: 90 tablet, Rfl: 1   Cyanocobalamin (B-12) 1000 MCG LOZG, DISSOLVE ONE TABLET UNDER THE TONGUE DAILY, Disp: 100 lozenge, Rfl: 0   losartan (COZAAR) 100 MG tablet, Take 1 tablet (100 mg total) by mouth daily., Disp: 90 tablet, Rfl: 1   metFORMIN (GLUCOPHAGE XR) 750 MG 24 hr tablet, Take 1 tablet (750 mg total) by mouth daily with breakfast., Disp: 90 tablet, Rfl: 1   Multiple Vitamins-Minerals (MULTIVITAMIN) tablet, Take 1 tablet by mouth daily., Disp: 100 tablet, Rfl: 1   Vitamin D, Ergocalciferol, (DRISDOL) 1.25 MG (50000 UNIT) CAPS capsule, Take 1 capsule (50,000 Units total) by mouth every 7 (seven) days., Disp: 12 capsule, Rfl: 1  No Known Allergies   ROS  Constitutional: Negative  for fever or weight change.  Respiratory: Negative for cough and shortness of breath.   Cardiovascular: Negative for chest pain or palpitations.  Gastrointestinal: Negative for abdominal pain, no bowel changes.  Musculoskeletal: Negative for gait problem or joint swelling.  Skin: Negative for rash.  Neurological: Negative for dizziness or headache.  No other specific complaints in a complete review of systems (except as listed in HPI above).   Objective  Vitals:   07/25/21 0910  BP: 136/84  Pulse: 89  Resp: 16  Temp: 98.1 F (36.7 C)  SpO2: 98%  Weight: 221 lb (100.2 kg)  Height: _0  (1.651 m)    Body mass index is 36.78 kg/m.  Physical Exam  Constitutional: Patient appears well-developed and well-nourished. No distress.  HENT: Head: Normocephalic and atraumatic. Ears: B TMs ok, no erythema or effusion; Nose: Not done . Mouth/Throat: not done  Eyes: Conjunctivae and EOM are normal. Pupils are equal, round, and reactive to light. No scleral icterus.  Neck: Normal range of motion. Neck supple. No JVD present. No thyromegaly present.  Cardiovascular: Normal rate, regular rhythm and normal heart sounds.  No murmur heard. No BLE edema. Pulmonary/Chest: Effort normal and breath sounds normal. No respiratory distress. Abdominal: Soft. Bowel sounds are normal, no distension. There is no tenderness. no masses Breast: scars from old breast reduction  FEMALE GENITALIA:  Not done  RECTAL: not done  Musculoskeletal: Normal range of motion, no joint effusions. No gross deformities Neurological: he is alert and oriented to person, place, and time. No cranial nerve deficit. Coordination, balance, strength, speech and gait are normal.  Skin: erythema, oozing , worse on left axilla than right  Psychiatric: Patient has a normal mood and affect. behavior is normal. Judgment and thought content normal.   Recent Results (from the  past 2160 hour(s))  POCT HgB A1C     Status: Abnormal    Collection Time: 06/21/21 10:04 AM  Result Value Ref Range   Hemoglobin A1C 6.5 (A) 4.0 - 5.6 %   HbA1c POC (<> result, manual entry)     HbA1c, POC (prediabetic range)     HbA1c, POC (controlled diabetic range)    Lipid panel     Status: Abnormal   Collection Time: 06/21/21 10:36 AM  Result Value Ref Range   Cholesterol 113 <200 mg/dL   HDL 32 (L) > OR = 50 mg/dL   Triglycerides 70 <150 mg/dL   LDL Cholesterol (Calc) 66 mg/dL (calc)    Comment: Reference range: <100 . Desirable range <100 mg/dL for primary prevention;   <70 mg/dL for patients with CHD or diabetic patients  with > or = 2 CHD risk factors. Marland Kitchen LDL-C is now calculated using the Martin-Hopkins  calculation, which is a validated novel method providing  better accuracy than the Friedewald equation in the  estimation of LDL-C.  Cresenciano Genre et al. Annamaria Helling. 4008;676(19): 2061-2068  (http://education.[facility].com/faq/FAQ164)    Total CHOL/HDL Ratio 3.5 <5.0 (calc)   Non-HDL Cholesterol (Calc) 81 <130 mg/dL (calc)    Comment: For patients with diabetes plus 1 major ASCVD risk  factor, treating to a non-HDL-C goal of <100 mg/dL  (LDL-C of <70 mg/dL) is considered a therapeutic  option.   CBC with Differential/Platelet     Status: Abnormal   Collection Time: 06/21/21 10:36 AM  Result Value Ref Range   WBC 5.5 3.8 - 10.8 Thousand/uL   RBC 4.86 3.80 - 5.10 Million/uL   Hemoglobin 12.8 11.7 - 15.5 g/dL   HCT 39.9 35.0 - 45.0 %   MCV 82.1 80.0 - 100.0 fL   MCH 26.3 (L) 27.0 - 33.0 pg   MCHC 32.1 32.0 - 36.0 g/dL   RDW 13.3 11.0 - 15.0 %   Platelets 293 140 - 400 Thousand/uL   MPV 10.6 7.5 - 12.5 fL   Neutro Abs 3,806 1,500 - 7,800 cells/uL   Lymphs Abs 1,221 850 - 3,900 cells/uL   Absolute Monocytes 319 200 - 950 cells/uL   Eosinophils Absolute 143 15 - 500 cells/uL   Basophils Absolute 11 0 - 200 cells/uL   Neutrophils Relative % 69.2 %   Total Lymphocyte 22.2 %   Monocytes Relative 5.8 %   Eosinophils  Relative 2.6 %   Basophils Relative 0.2 %  COMPLETE METABOLIC PANEL WITH GFR     Status: Abnormal   Collection Time: 06/21/21 10:36 AM  Result Value Ref Range   Glucose, Bld 104 (H) 65 - 99 mg/dL    Comment: .            Fasting reference interval . For someone without known diabetes, a glucose value between 100 and 125 mg/dL is consistent with prediabetes and should be confirmed with a follow-up test. .    BUN 11 7 - 25 mg/dL   Creat 0.68 0.50 - 1.03 mg/dL   eGFR 104 > OR = 60 mL/min/1.72m    Comment: The eGFR is based on the CKD-EPI 2021 equation. To calculate  the new eGFR from a previous Creatinine or Cystatin C result, go to https://www.kidney.org/professionals/ kdoqi/gfr%5Fcalculator    BUN/Creatinine Ratio NOT APPLICABLE 6 - 22 (calc)   Sodium 138 135 - 146 mmol/L   Potassium 4.3 3.5 - 5.3 mmol/L   Chloride 104 98 - 110 mmol/L   CO2 29 20 -  32 mmol/L   Calcium 8.8 8.6 - 10.4 mg/dL   Total Protein 7.4 6.1 - 8.1 g/dL   Albumin 3.9 3.6 - 5.1 g/dL   Globulin 3.5 1.9 - 3.7 g/dL (calc)   AG Ratio 1.1 1.0 - 2.5 (calc)   Total Bilirubin 0.5 0.2 - 1.2 mg/dL   Alkaline phosphatase (APISO) 119 37 - 153 U/L   AST 16 10 - 35 U/L   ALT 18 6 - 29 U/L  Vitamin B12     Status: None   Collection Time: 06/21/21 10:36 AM  Result Value Ref Range   Vitamin B-12 427 200 - 1,100 pg/mL  VITAMIN D 25 Hydroxy (Vit-D Deficiency, Fractures)     Status: Abnormal   Collection Time: 06/21/21 10:36 AM  Result Value Ref Range   Vit D, 25-Hydroxy 29 (L) 30 - 100 ng/mL    Comment: Vitamin D Status         25-OH Vitamin D: . Deficiency:                    <20 ng/mL Insufficiency:             20 - 29 ng/mL Optimal:                 > or = 30 ng/mL . For 25-OH Vitamin D testing on patients on  D2-supplementation and patients for whom quantitation  of D2 and D3 fractions is required, the [facility](TM) 25-OH VIT D, (D2,D3), LC/MS/MS is recommended: order  code [id number] (patients >64yr). See  Note 1 . Note 1 . For additional information, please refer to  http://education.[facility].com/faq/FAQ199  (This link is being provided for informational/ educational purposes only.)      Fall Risk: Fall Risk  07/25/2021 06/21/2021 02/24/2021 07/13/2020 02/24/2020  Falls in the past year? 0 0 0 0 0  Number falls in past yr: 0 0 0 0 0  Injury with Fall? 0 0 0 0 0  Risk for fall due to : No Fall Risks No Fall Risks - - -  Follow up Falls prevention discussed Falls prevention discussed - - -     Functional Status Survey: Is the patient deaf or have difficulty hearing?: No Does the patient have difficulty seeing, even when wearing glasses/contacts?: No Does the patient have difficulty concentrating, remembering, or making decisions?: No Does the patient have difficulty walking or climbing stairs?: No Does the patient have difficulty dressing or bathing?: No Does the patient have difficulty doing errands alone such as visiting a doctor's office or shopping?: No   Assessment & Plan  1. Well adult exam  Discussed importance of increasing physical activity   2. Screen for colon cancer  - Cologuard  3. Breast cancer screening by mammogram  - MM 3D SCREEN BREAST BILATERAL; Future   4. Hidradenitis suppurativa  - Ambulatory referral to Dermatology   -USPSTF grade A and B recommendations reviewed with patient; age-appropriate recommendations, preventive care, screening tests, etc discussed and encouraged; healthy living encouraged; see AVS for patient education given to patient -Discussed importance of 150 minutes of physical activity weekly, eat two servings of fish weekly, eat one serving of tree nuts ( cashews, pistachios, pecans, almonds..Marland Kitchen every other day, eat 6 servings of fruit/vegetables daily and drink plenty of water and avoid sweet beverages.

## 2021-07-25 ENCOUNTER — Other Ambulatory Visit: Payer: Self-pay

## 2021-07-25 ENCOUNTER — Encounter: Payer: Self-pay | Admitting: Family Medicine

## 2021-07-25 ENCOUNTER — Ambulatory Visit (INDEPENDENT_AMBULATORY_CARE_PROVIDER_SITE_OTHER): Payer: BC Managed Care – PPO | Admitting: Family Medicine

## 2021-07-25 VITALS — BP 136/84 | HR 89 | Temp 98.1°F | Resp 16 | Ht 65.0 in | Wt 221.0 lb

## 2021-07-25 DIAGNOSIS — Z1211 Encounter for screening for malignant neoplasm of colon: Secondary | ICD-10-CM

## 2021-07-25 DIAGNOSIS — Z1231 Encounter for screening mammogram for malignant neoplasm of breast: Secondary | ICD-10-CM

## 2021-07-25 DIAGNOSIS — Z0000 Encounter for general adult medical examination without abnormal findings: Secondary | ICD-10-CM | POA: Diagnosis not present

## 2021-07-25 DIAGNOSIS — L732 Hidradenitis suppurativa: Secondary | ICD-10-CM

## 2021-07-25 NOTE — Patient Instructions (Signed)

## 2021-12-08 ENCOUNTER — Ambulatory Visit: Payer: Self-pay

## 2021-12-08 NOTE — Telephone Encounter (Signed)
Reason for Disposition ?? Tender lump (swelling or "ball") at vaginal opening ? ?Answer Assessment - Initial Assessment Questions ?1. SYMPTOM: "What's the main symptom you're concerned about?" (e.g., rash, itching, swelling, dryness) ?    boil ?2. LOCATION: "Where is the  sx located?" (e.g., inside/outside, left/right) ?    Left  ?3. ONSET: "When did the  sx  start?" ?    Wednesday, was there previously but went away  ?4. PAIN: "Is there any pain?" If Yes, ask: "How bad is it?" (Scale: 1-10; mild, moderate, severe) ?  -  MILD (1-3): doesn't interfere with normal activities  ?  -  MODERATE (4-7): interferes with normal activities (e.g., work or school) or awakens from sleep   ?  -  SEVERE (8-10): excruciating pain, unable to do any normal activities ?    6/7 ?6. OTHER SYMPTOMS: "Do you have any other symptoms?" (e.g., fever, vaginal bleeding, pain with urination) ? ?Protocols used: Vulvar Symptoms-A-AH ? ?

## 2021-12-08 NOTE — Telephone Encounter (Signed)
3rd attempt at contacting pt.   Left voicemail to return call.   Per policy I have forwarded this to [facility] since 3 attempts to contact pt have been attempted. ?

## 2021-12-08 NOTE — Telephone Encounter (Signed)
?  Chief Complaint: vaginal boil ?Symptoms: boil on inner L vagina, painful and tender ?Frequency: 2 days but been there before and went away ?Pertinent Negatives: Patient denies discharge to area ?Disposition: [] ED /[] Urgent Care (no appt availability in office) / [x] Appointment(In office/virtual)/ []  [place] [facility]/ [] Home Care/ [] Refused Recommended Disposition /[] [place] [facility]/ []  Follow-up with PCP ?Additional Notes: scheduled appt for 12/11/21 at 0800 with Dr. Rosana Berger. Pt was unable to come in today for appt d/t being at work.  ? ?

## 2021-12-08 NOTE — Telephone Encounter (Signed)
2nd attempt. Pt called, LVMTCB to discuss symptoms with a nurse ?

## 2021-12-08 NOTE — Telephone Encounter (Signed)
Patient called, left VM to return the call to the office to discuss symptoms with a nurse. ? ?Summary: pimple on vagina  ? Pt states she has a pimple starting on her vagina and it is sore.  Pt wants to know anything OTC she can get for this?   ?  ? ?

## 2021-12-10 LAB — COLOGUARD
COLOGUARD: NEGATIVE
Cologuard: NEGATIVE

## 2021-12-11 ENCOUNTER — Encounter: Payer: Self-pay | Admitting: Internal Medicine

## 2021-12-11 ENCOUNTER — Ambulatory Visit: Payer: BC Managed Care – PPO | Admitting: Internal Medicine

## 2021-12-11 VITALS — BP 132/78 | HR 104 | Temp 97.8°F | Resp 16 | Ht 65.0 in | Wt 216.7 lb

## 2021-12-11 DIAGNOSIS — L732 Hidradenitis suppurativa: Secondary | ICD-10-CM | POA: Diagnosis not present

## 2021-12-11 DIAGNOSIS — L0291 Cutaneous abscess, unspecified: Secondary | ICD-10-CM

## 2021-12-11 MED ORDER — DOXYCYCLINE HYCLATE 100 MG PO TABS: 100.0000 mg | ORAL_TABLET | Freq: Two times a day (BID) | ORAL | 0 refills | Status: AC

## 2021-12-11 NOTE — Patient Instructions (Addendum)
It was great seeing you today! ? ?Plan discussed at today's visit: ?-Can use warm compresses to finish expressing the lesion ?-Wound culture taken today ?-Antibiotic sent to pharmacy ?-Wear cotton, loose fitting clothes and fragrance free powders  ?-Recommend calling surgery office for evaluation if the lesion doesn't decrease in size in the next few days  ? ?Follow up in: 1 week, has appointment on the 24th ? ?Take care and let us know if you have any questions or concerns prior to your next visit. ? ?Dr. Caralee Ates ? ?

## 2021-12-11 NOTE — Progress Notes (Signed)
? ?Acute Office Visit ? ?Subjective:  ? ?  ?Patient ID: Sara Foley, female    DOB: 06-18-1966, 56 y.o.   MRN: [id number] ? ?Chief Complaint  ?Patient presents with  ? Recurrent Skin Infections  ?  On vagina  ? ? ?HPI ?Patient is in today for vaginal boil like lesion. Noticed some soreness on the left labia a few days ago but it became bigger and more sore to the point where she couldn't walk. The lesion did open and drain on its own yesterday. Drainage consisted on some blood, light pink in color but mostly thick purulent drainage. It is continuing to drain and she is using a panty liner. She does have a history of hidradenitis suppurativa and was seen by surgery and had a surgical procedure in the groin area previously. She does have an appointment with Dermatologist in June. Today she denies fevers, chills other other skin changes, abscesses anywhere else. Denies vaginal pain, change in discharge or abnormal vaginal bleeding. Denies urinary symptoms, no dysuria or hematuria. No concerns for STD's.   ? ?Review of Systems  ?Constitutional:  Negative for chills and fever.  ?Genitourinary:  Negative for dysuria and hematuria.  ? ? ?   ?Objective:  ?  ?BP 132/78   Pulse (!) 104   Temp 97.8 ?F (36.6 ?C)   Resp 16   Ht 5\' 5"  (1.651 m)   Wt 216 lb 11.2 oz (98.3 kg)   SpO2 98%   BMI 36.06 kg/m?  ?BP Readings from Last 3 Encounters:  ?12/11/21 132/78  ?07/25/21 136/84  ?06/21/21 118/70  ? ?Wt Readings from Last 3 Encounters:  ?12/11/21 216 lb 11.2 oz (98.3 kg)  ?07/25/21 221 lb (100.2 kg)  ?06/21/21 221 lb 12.8 oz (100.6 kg)  ? ? ? ?Physical Exam ?Exam conducted with a chaperone present.  ?Constitutional:   ?   Appearance: Normal appearance.  ?HENT:  ?   Head: Normocephalic and atraumatic.  ?Eyes:  ?   Conjunctiva/sclera: Conjunctivae normal.  ?Cardiovascular:  ?   Rate and Rhythm: Normal rate and regular rhythm.  ?Pulmonary:  ?   Effort: Pulmonary effort is normal.  ?   Breath sounds: Normal breath sounds.   ?Genitourinary: ?   Comments: Left labia and vulva with large, hard lesion, draining small amount of purulent material, culture taken. No surrounding erythema, but lesion very tender. No fluctuance noted. ?Skin: ?   General: Skin is warm and dry.  ?Neurological:  ?   General: No focal deficit present.  ?   Mental Status: She is alert. Mental status is at baseline.  ?Psychiatric:     ?   Mood and Affect: Mood normal.     ?   Behavior: Behavior normal.  ? ? ?No results found for any visits on 12/11/21. ? ? ?   ?Assessment & Plan:  ? ?1. Hidradenitis suppurativa/Abscess: Abscess drained on its own, still draining slightly, wound culture taken. Will treat with Doxycycline 100 mg BID x 10 days and will change based on wound culture if necessary. Recommend she follow with general surgery if lesion gets bigger or does not drain all the way, will need I&D at that point. Discussed using fragrance free soaps and powders to keep the area dry and clean and wearing cotton undergarments and loose fitting breathable clothing. She does have an appointment with Dermatology in June. Follow up scheduled here next week.  ? ?- Wound culture ?- doxycycline (VIBRA-TABS) 100 MG tablet; Take 1 tablet (  100 mg total) by mouth 2 (two) times daily for 10 days.  Dispense: 20 tablet; Refill: 0 ? ? ?Meds ordered this encounter  ?Medications  ? doxycycline (VIBRA-TABS) 100 MG tablet  ?  Sig: Take 1 tablet (100 mg total) by mouth 2 (two) times daily for 10 days.  ?  Dispense:  20 tablet  ?  Refill:  0  ? ? ?Return for already scheduled . ? ?Margarita Mail, DO ? ? ?

## 2021-12-14 LAB — WOUND CULTURE
MICRO NUMBER:: 13399610
RESULT:: NORMAL
SPECIMEN QUALITY:: ADEQUATE

## 2021-12-19 NOTE — Progress Notes (Unsigned)
Name: Sara Foley   MRN: [id number]    DOB: 25-Feb-1966   Date:12/20/2021       Progress Note  Subjective  Chief Complaint  Follow Up  HPI  HTN: bp is controlled with Losartan 100 mg , denies side effects of medication, continue current medications. No chest pain, palpitation, dizziness  or sob. Continue current regiment    Hydradenitis suppurative: she had surgery done by Dr. Lady Gary Nov 2020.  She was seen for Dr. Reola Mosher due to increase in pain due to a lesion on her vulva, it was drained and took antibiotics and is doing well now  Diabetes type II: she had gestational diabetes , followed by many years of pre-diabetes, in July of 2021 A1C spiked to 6.6 % we discussed life style modifications but A1C went higher to 6.8 %, she took Metformin and A1C went down to 6.5 % , she stopped it due to diarrhea but she has changed her diet - eating more vegetables and fruit and A1C is still at goal 6.56%  .DM associated HTN, dyslipidemia, and obesity. She does not think she ever filled rx. Discussed importance of trying medication to help control DM and also lose weight. She agreed on trying Rybelsus - no family history of thyroid cancer or person history of pancreatitis    Vitamin D and B12 deficiency: not taking supplementation, advised importance of resuming   Dyslipidemia: Low HDL, discussed increasing fish and tree nuts to her diet ,advised to try Atorvastatin    Morbid obesity: she has a BMI over 35, with co-morbidities, HTN, dyslipidemia and metabolic syndrome. We will start her on Rybelsus    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 2 diabetes mellitus (HCC) 06/21/2021   Metabolic syndrome 02/28/2016   Low HDL (under 40) 02/28/2016   History of hysterectomy leaving cervix intact 01/19/2016   Vitamin D deficiency 01/18/2016   Vitamin B12 deficiency 01/18/2016   History of gestational diabetes 01/17/2016    Hypertension, benign 01/17/2016   Hidradenitis suppurativa 01/17/2016   History of iron deficiency anemia 01/17/2016    Past Surgical History:  Procedure Laterality Date   BREAST BIOPSY Left 04/14/2012   left breast stereotactic biopsy; fibroadenoma   BREAST REDUCTION SURGERY  1999   HYDRADENITIS EXCISION Left 06/05/2019   Procedure: EXCISION HIDRADENITIS GROIN;  Surgeon: Duanne Guess, MD;  Location: [facility] OR[facility];  Service: General;  Laterality: Left;   REDUCTION MAMMAPLASTY Bilateral 2000   SUPRACERVICAL ABDOMINAL HYSTERECTOMY  01/2012   due to fibroid tumors    Family History  Problem Relation Age of Onset   Prostate cancer Other    Colon cancer Maternal Uncle    Diabetes Mother    Arthritis/Rheumatoid Mother    Leukemia Father     Social History   Tobacco Use   Smoking status: Never   Smokeless tobacco: Never  Substance Use Topics   Alcohol use: Yes    Alcohol/week: 3.0 standard drinks    Types: 3 Standard drinks or equivalent per week     Current Outpatient Medications:    Semaglutide (RYBELSUS) 7 MG TABS, Take 7 mg by mouth daily., Disp: 90 tablet, Rfl: 0   atorvastatin (LIPITOR) 10 MG tablet, Take 1 tablet (10 mg total) by mouth daily., Disp: 90 tablet, Rfl: 1   Cyanocobalamin (B-12) 1000 MCG LOZG, Place 1 tablet under the tongue daily at 12 noon. DISSOLVE ONE TABLET UNDER THE TONGUE  DAILY, Disp: 100 lozenge, Rfl: 1   doxycycline (VIBRA-TABS) 100 MG tablet, Take 1 tablet (100 mg total) by mouth 2 (two) times daily for 10 days., Disp: 20 tablet, Rfl: 0   losartan (COZAAR) 100 MG tablet, Take 1 tablet (100 mg total) by mouth daily., Disp: 90 tablet, Rfl: 1  Allergies  Allergen Reactions   Metformin Diarrhea    I personally reviewed active problem list, medication list, allergies, family history, social history, health maintenance with the patient/caregiver today.   ROS  Constitutional: Negative for fever or weight change.  Respiratory: Negative for cough  and shortness of breath.   Cardiovascular: Negative for chest pain or palpitations.  Gastrointestinal: Negative for abdominal pain, no bowel changes.  Musculoskeletal: Negative for gait problem or joint swelling.  Skin: Positive  for rash.  Neurological: Negative for dizziness or headache.  No other specific complaints in a complete review of systems (except as listed in HPI above).   Objective  Vitals:   12/20/21 0819  BP: 132/80  Pulse: 97  Resp: 16  SpO2: 98%  Weight: 218 lb (98.9 kg)  Height:  (1.651 m)    Body mass index is 36.28 kg/m.  Physical Exam  Constitutional: Patient appears well-developed and well-nourished. Obese  No distress.  HEENT: head atraumatic, normocephalic, pupils equal and reactive to light, neck supple Cardiovascular: Normal rate, regular rhythm and normal heart sounds.  No murmur heard. No BLE edema. Pulmonary/Chest: Effort normal and breath sounds normal. No respiratory distress. Abdominal: Soft.  There is no tenderness. Psychiatric: Patient has a normal mood and affect. behavior is normal. Judgment and thought content normal.   Recent Results (from the past 2160 hour(s))  Cologuard     Status: None   Collection Time: 11/27/21  6:00 PM  Result Value Ref Range   COLOGUARD Negative Negative    Comment:  NEGATIVE TEST RESULT. A negative Cologuard result indicates a low likelihood that a colorectal cancer (CRC) or advanced adenoma (adenomatous polyps with more advanced pre-malignant features)  is present. The chance that a person with a negative Cologuard test has a colorectal cancer is less than 1 in 1500 (negative predictive value >99.9%) or has an  advanced adenoma is less than  5.3% (negative predictive value 94.7%). These data are based on a prospective cross-sectional study of 10,000 individuals at average risk for colorectal cancer who were screened with both Cologuard and colonoscopy. (Imperiale T. et al, N Engl J Med 2014;370(14):1286-1297)  The normal value (reference range) for this assay is negative.  COLOGUARD RE-SCREENING RECOMMENDATION: Periodic colorectal cancer screening is an important part of preventive healthcare for asymptomatic individuals at average risk for colorectal cancer.  Following a negative Cologuard result, the American Cancer Society and U.S.  Multi-Society Task Force screening guidelines recommend a Cologuard re-screening interval of 3 years.  References: American Cancer Society Guideline for Colorectal Cancer Screening: https://www.cancer.org/cancer/colon-rectal-cancer/detection-diagnosis-staging/acs-recommendations.html.; Rex DK, Boland CR, Dominitz JK, Colorectal Cancer Screening: Recommendations for Physicians and Patients from the U.S. Multi-Society Task Force on Colorectal Cancer Screening , Am J Gastroenterology 2017; 112:1016-1030.  TEST DESCRIPTION: Composite algorithmic analysis of stool DNA-biomarkers with hemoglobin immunoassay.   Quantitative values of individual biomarkers are not reportable and are not associated with individual biomarker result reference ranges. Cologuard is intended for colorectal cancer screening of adults of either sex, 45 years or older, who are at average-risk for colorectal cancer (CRC). Cologuard has been approved for use by the U.S. FDA. The performance of Cologuard was  established in  a cross sectional study of average-risk adults aged 54-84. Cologuard performance in patients ages 345 to 2749 years was estimated by sub-group analysis of near-age groups. Colonoscopies performed for a positive result may find as the most clinically significant lesion: colorectal cancer [4.0%], advanced adenoma (including sessile serrated polyps greater than or equal to 1cm diameter) [20%] or non- advanced adenoma [31%]; or no colorectal neoplasia [45%]. These estimates are derived from a prospective cross-sectional screening study of 10,000 individuals at average risk for colorectal cancer who were  screened with both Cologuard and colonoscopy. (Imperiale T. et al, Macy MisN Engl J Med 2014;370(14):1286-1297.) Cologuard may produce a false negative or false positive result (no colorectal cancer or precancerous polyp present at colonoscopy follow up). A negative Cologuard test result does not guarantee the absence of CRC or advanced adenoma (pre-cancer). The current Cologuard  screening interval is every 3 years. Science writer(American Cancer Society and U.S. Therapist, musicMulti-Society Task Force). Cologuard performance data in a 10,000 patient pivotal study using colonoscopy as the reference method can be accessed at the following location: www.exactlabs.com/results. Additional description of the Cologuard test process, warnings and precautions can be found at www.cologuard.com.   Cologuard     Status: None   Collection Time: 12/10/21 12:00 AM  Result Value Ref Range   Cologuard Negative Negative  Wound culture     Status: None   Collection Time: 12/11/21  8:57 AM   Specimen: Wound  Result Value Ref Range   MICRO NUMBER: [id number]    SPECIMEN QUALITY: Adequate    SOURCE: GENITAL BOIL    STATUS: FINAL    GRAM STAIN: Gram positive cocci in chains     Comment: Moderate Polymorphonuclear leukocytes Few epithelial cells Rare Gram positive cocci in chains Rare Gram negative bacilli   RESULT: Growth of normal urogenital flora.   POCT HgB A1C     Status: Abnormal   Collection Time: 12/20/21  8:20 AM  Result Value Ref Range   Hemoglobin A1C 6.6 (A) 4.0 - 5.6 %   HbA1c POC (<> result, manual entry)     HbA1c, POC (prediabetic range)     HbA1c, POC (controlled diabetic range)        PHQ2/9:    12/20/2021    8:17 AM 12/11/2021    8:14 AM 07/25/2021    9:05 AM 06/21/2021    9:52 AM 02/24/2021    9:16 AM  Depression screen PHQ 2/9  Decreased Interest 0 0 0 0 0  Down, Depressed, Hopeless 0 0 0 0 0  PHQ - 2 Score 0 0 0 0 0  Altered sleeping 0 0 0 0   Tired, decreased energy 0 0 0 0   Change in appetite 0 0 0 0   Feeling  bad or failure about yourself  0 0 0 0   Trouble concentrating 0 0 0 0   Moving slowly or fidgety/restless 0 0 0 0   Suicidal thoughts 0 0 0 0   PHQ-9 Score 0 0 0 0   Difficult doing work/chores  Not difficult at all  Not difficult at all     phq 9 is negative   Fall Risk:    12/20/2021    8:17 AM 12/11/2021    8:14 AM 07/25/2021    9:04 AM 06/21/2021    9:51 AM 02/24/2021    9:16 AM  Fall Risk   Falls in the past year? 0 0 0 0 0  Number falls in past yr: 0 0 0 0  0  Injury with Fall? 0 0 0 0 0  Risk for fall due to : No Fall Risks  No Fall Risks No Fall Risks   Follow up Falls prevention discussed  Falls prevention discussed Falls prevention discussed       Functional Status Survey: Is the patient deaf or have difficulty hearing?: No Does the patient have difficulty seeing, even when wearing glasses/contacts?: No Does the patient have difficulty concentrating, remembering, or making decisions?: No Does the patient have difficulty walking or climbing stairs?: No Does the patient have difficulty dressing or bathing?: No Does the patient have difficulty doing errands alone such as visiting a doctor's office or shopping?: No    Assessment & Plan  Problem List Items Addressed This Visit     Hypertension, benign    BP is at goal       Relevant Medications   losartan (COZAAR) 100 MG tablet   atorvastatin (LIPITOR) 10 MG tablet   Vitamin B12 deficiency    She must resume supplementation        Relevant Medications   Cyanocobalamin (B-12) 1000 MCG LOZG   Dyslipidemia due to type 2 diabetes mellitus (HCC) - Primary    Advised to resume Statin therapy She is willing to try Rybelsus, unable to tolerate Metformin- caused diarrhea       Relevant Medications   losartan (COZAAR) 100 MG tablet   atorvastatin (LIPITOR) 10 MG tablet   Semaglutide (RYBELSUS) 7 MG TABS   Other Relevant Orders   POCT HgB A1C (Completed)   Morbid obesity (HCC)    Dicussed importance of  losing weight , she has changed her diet        Relevant Medications   Semaglutide (RYBELSUS) 7 MG TABS   Low HDL (under 40)   Relevant Medications   atorvastatin (LIPITOR) 10 MG tablet   Vitamin D deficiency   Hidradenitis suppurativa

## 2021-12-20 ENCOUNTER — Encounter: Payer: Self-pay | Admitting: Family Medicine

## 2021-12-20 ENCOUNTER — Ambulatory Visit (INDEPENDENT_AMBULATORY_CARE_PROVIDER_SITE_OTHER): Payer: BC Managed Care – PPO | Admitting: Family Medicine

## 2021-12-20 VITALS — BP 132/80 | HR 97 | Resp 16 | Ht 65.0 in | Wt 218.0 lb

## 2021-12-20 DIAGNOSIS — I10 Essential (primary) hypertension: Secondary | ICD-10-CM | POA: Diagnosis not present

## 2021-12-20 DIAGNOSIS — E1169 Type 2 diabetes mellitus with other specified complication: Secondary | ICD-10-CM | POA: Diagnosis not present

## 2021-12-20 DIAGNOSIS — L732 Hidradenitis suppurativa: Secondary | ICD-10-CM

## 2021-12-20 DIAGNOSIS — E538 Deficiency of other specified B group vitamins: Secondary | ICD-10-CM

## 2021-12-20 DIAGNOSIS — E785 Hyperlipidemia, unspecified: Secondary | ICD-10-CM | POA: Diagnosis not present

## 2021-12-20 DIAGNOSIS — E786 Lipoprotein deficiency: Secondary | ICD-10-CM

## 2021-12-20 DIAGNOSIS — E559 Vitamin D deficiency, unspecified: Secondary | ICD-10-CM

## 2021-12-20 LAB — POCT GLYCOSYLATED HEMOGLOBIN (HGB A1C): Hemoglobin A1C: 6.6 % — AB (ref 4.0–5.6)

## 2021-12-20 MED ORDER — LOSARTAN POTASSIUM 100 MG PO TABS: 100.0000 mg | ORAL_TABLET | Freq: Every day | ORAL | 1 refills | Status: DC

## 2021-12-20 MED ORDER — RYBELSUS 7 MG PO TABS: 7.0000 mg | ORAL_TABLET | Freq: Every day | ORAL | 0 refills | Status: DC

## 2021-12-20 MED ORDER — B-12 1000 MCG PO LOZG: 1.0000 | LOZENGE | Freq: Every day | ORAL | 1 refills | Status: DC

## 2021-12-20 MED ORDER — ATORVASTATIN CALCIUM 10 MG PO TABS: 10.0000 mg | ORAL_TABLET | Freq: Every day | ORAL | 1 refills | Status: DC

## 2021-12-20 NOTE — Assessment & Plan Note (Signed)
Advised to resume Statin therapy She is willing to try Rybelsus, unable to tolerate Metformin- caused diarrhea

## 2021-12-20 NOTE — Assessment & Plan Note (Signed)
She must resume supplementation

## 2021-12-20 NOTE — Assessment & Plan Note (Signed)
Dicussed importance of losing weight , she has changed her diet

## 2021-12-20 NOTE — Assessment & Plan Note (Signed)
BP is at goal. 

## 2022-01-03 ENCOUNTER — Ambulatory Visit: Payer: BC Managed Care – PPO | Admitting: Dermatology

## 2022-01-11 ENCOUNTER — Ambulatory Visit: Payer: BC Managed Care – PPO | Admitting: Dermatology

## 2022-01-11 ENCOUNTER — Encounter: Payer: Self-pay | Admitting: Dermatology

## 2022-01-11 DIAGNOSIS — L732 Hidradenitis suppurativa: Secondary | ICD-10-CM | POA: Diagnosis not present

## 2022-01-11 MED ORDER — ISOTRETINOIN 20 MG PO CAPS: 20.0000 mg | ORAL_CAPSULE | Freq: Every day | ORAL | 0 refills | Status: AC

## 2022-01-11 NOTE — Progress Notes (Unsigned)
   New Patient Visit  Subjective  Sara Foley is a 56 y.o. female who presents for the following: hidradenitis suppurativa (Dur: several years. Diagnosed few years ago with HS. Affected areas include axillae, inguinal creases/groin, buttocks. Hx of surgery at left thigh in the past. Currently on oral antibiotics for recent flare. Here to discuss other treatment options).  Review of Systems: No other skin or systemic complaints except as noted in HPI or Assessment and Plan.  Objective  Well appearing patient in no apparent distress; mood and affect are within normal limits.  A full examination was performed including scalp, head, eyes, ears, nose, lips, neck, chest, axillae, abdomen, back, buttocks, bilateral upper extremities, bilateral lower extremities, hands, feet, fingers, toes, fingernails, and toenails. All findings within normal limits unless otherwise noted below.  B/L axilla, groin, buttocks Scarring, fistula formation and keloids of right axilla and left axilla, scarring at medial thighs/groin. Large scar at post left thigh secondary to HS surgery          Assessment & Plan  Hidradenitis suppurativa B/L axilla, groin, buttocks  Hidradenitis Suppurativa is a chronic; persistent; non-curable, but treatable condition due to abnormal inflamed sweat glands in the body folds (axilla, inframammary, groin, medial thighs), causing recurrent painful draining cysts and scarring. It can be associated with severe scarring acne and cysts; also abscesses and scarring of scalp. The goal is control and prevention of flares, as it is not curable. Scars are permanent and can be thickened. Treatment may include daily use of topical medication and oral antibiotics.  Oral isotretinoin may also be helpful.  For more severe cases, Humira (a biologic injection) may be prescribed to decrease the inflammatory process and prevent flares.  When indicated, inflamed cysts may also be treated surgically.    Discussed Humira, Isotretinoin, oral antibiotics.   Recommend starting Isotretinoin.  Patient has had partial hysterectomy in 2013. Does not have a uterus.   Reviewed labs from 06/21/2021. WNL, okay to start Isotretinoin. Start Isotretinoin 20 mg once daily with food.  Patient registered/confirmed in iPledge and isotretinoin sent to pharmacy.   iPledge: [id number] Pharmacy: [facility] [address] BC: hysterectomy Goal dosage: 19,780 mg  Reviewed potential side effects of isotretinoin including xerosis, cheilitis, hepatitis, hyperlipidemia, and severe birth defects if taken by a pregnant woman. Reviewed reports of suicidal ideation in those with a history of depression while taking isotretinoin and reports of diagnosis of inflammatory bowl disease while taking isotretinoin as well as the lack of evidence for a causal relationship between isotretinoin, depression and IBD. Patient advised to reach out with any questions or concerns. Patient advised not to share pills or donate blood while on treatment or for one month after completing treatment.   ISOtretinoin (ACCUTANE) 20 MG capsule - B/L axilla, groin, buttocks Take 1 capsule (20 mg total) by mouth daily. Take with food   Return in about 4 weeks (around 02/08/2022) for HS Recheck, Isotretinoin.  I, Lawson Radar, CMA, am acting as scribe for Armida Sans, MD. Documentation: I have reviewed the above documentation for accuracy and completeness, and I agree with the above.  Armida Sans, MD

## 2022-01-11 NOTE — Patient Instructions (Signed)
Reviewed potential side effects of isotretinoin including xerosis, cheilitis, hepatitis, hyperlipidemia, and severe birth defects if taken by a pregnant woman. Reviewed reports of suicidal ideation in those with a history of depression while taking isotretinoin and reports of diagnosis of inflammatory bowl disease while taking isotretinoin as well as the lack of evidence for a causal relationship between isotretinoin, depression and IBD. Patient advised to reach out with any questions or concerns. Patient advised not to share pills or donate blood while on treatment or for one month after completing treatment.  Due to recent changes in healthcare laws, you may see results of your pathology and/or laboratory studies on MyChart before the doctors have had a chance to review them. We understand that in some cases there may be results that are confusing or concerning to you. Please understand that not all results are received at the same time and often the doctors may need to interpret multiple results in order to provide you with the best plan of care or course of treatment. Therefore, we ask that you please give us 2 business days to thoroughly review all your results before contacting the office for clarification. Should we see a critical lab result, you will be contacted sooner.   If You Need Anything After Your Visit  If you have any questions or concerns for your doctor, please call our main line at [phone] and press option 4 to reach your doctor's medical assistant. If no one answers, please leave a voicemail as directed and we will return your call as soon as possible. Messages left after 4 pm will be answered the following business day.   You may also send us a message via MyChart. We typically respond to MyChart messages within 1-2 business days.  For prescription refills, please ask your pharmacy to contact our office. Our fax number is [phone].  If you have an urgent issue when the  clinic is closed that cannot wait until the next business day, you can page your doctor at the number below.    Please note that while we do our best to be available for urgent issues outside of office hours, we are not available 24/7.   If you have an urgent issue and are unable to reach us, you may choose to seek medical care at your doctor's office, retail clinic, urgent care center, or emergency room.  If you have a medical emergency, please immediately call 911 or go to the emergency department.  Pager Numbers  - Dr. Kowalski: [phone]  - Dr. Moye: [phone]  - Dr. Stewart: [phone]  In the event of inclement weather, please call our main line at [phone] for an update on the status of any delays or closures.  Dermatology Medication Tips: Please keep the boxes that topical medications come in in order to help keep track of the instructions about where and how to use these. Pharmacies typically print the medication instructions only on the boxes and not directly on the medication tubes.   If your medication is too expensive, please contact our office at [phone] option 4 or send us a message through MyChart.   We are unable to tell what your co-pay for medications will be in advance as this is different depending on your insurance coverage. However, we may be able to find a substitute medication at lower cost or fill out paperwork to get insurance to cover a needed medication.   If a prior authorization is required to get your medication covered by   your insurance company, please allow us 1-2 business days to complete this process.  Drug prices often vary depending on where the prescription is filled and some pharmacies may offer cheaper prices.  The website www.goodrx.com contains coupons for medications through different pharmacies. The prices here do not account for what the cost may be with help from insurance (it may be cheaper with your insurance), but the  website can give you the price if you did not use any insurance.  - You can print the associated coupon and take it with your prescription to the pharmacy.  - You may also stop by our office during regular business hours and pick up a GoodRx coupon card.  - If you need your prescription sent electronically to a different pharmacy, notify our office through [facility] MyChart or by phone at [phone] option 4.     Si Usted Necesita Algo Despus de Su Visita  Tambin puede enviarnos un mensaje a travs de MyChart. Por lo general respondemos a los mensajes de MyChart en el transcurso de 1 a 2 das hbiles.  Para renovar recetas, por favor pida a su farmacia que se ponga en contacto con nuestra oficina. Nuestro nmero de fax es el [phone].  Si tiene un asunto urgente cuando la clnica est cerrada y que no puede esperar hasta el siguiente da hbil, puede llamar/localizar a su doctor(a) al nmero que aparece a continuacin.   Por favor, tenga en cuenta que aunque hacemos todo lo posible para estar disponibles para asuntos urgentes fuera del horario de oficina, no estamos disponibles las 24 horas del da, los 7 das de la semana.   Si tiene un problema urgente y no puede comunicarse con nosotros, puede optar por buscar atencin mdica  en el consultorio de su doctor(a), en una clnica privada, en un centro de atencin urgente o en una sala de emergencias.  Si tiene una emergencia mdica, por favor llame inmediatamente al 911 o vaya a la sala de emergencias.  Nmeros de bper  - Dr. Kowalski: [phone]  - Dra. Moye: [phone]  - Dra. Stewart: [phone]  En caso de inclemencias del tiempo, por favor llame a nuestra lnea principal al [phone] para una actualizacin sobre el estado de cualquier retraso o cierre.  Consejos para la medicacin en dermatologa: Por favor, guarde las cajas en las que vienen los medicamentos de uso tpico para ayudarle a seguir las  instrucciones sobre dnde y cmo usarlos. Las farmacias generalmente imprimen las instrucciones del medicamento slo en las cajas y no directamente en los tubos del medicamento.   Si su medicamento es muy caro, por favor, pngase en contacto con nuestra oficina llamando al [phone] y presione la opcin 4 o envenos un mensaje a travs de MyChart.   No podemos decirle cul ser su copago por los medicamentos por adelantado ya que esto es diferente dependiendo de la cobertura de su seguro. Sin embargo, es posible que podamos encontrar un medicamento sustituto a menor costo o llenar un formulario para que el seguro cubra el medicamento que se considera necesario.   Si se requiere una autorizacin previa para que su compaa de seguros cubra su medicamento, por favor permtanos de 1 a 2 das hbiles para completar este proceso.  Los precios de los medicamentos varan con frecuencia dependiendo del lugar de dnde se surte la receta y alguna farmacias pueden ofrecer precios ms baratos.  El sitio web www.goodrx.com tiene cupones para medicamentos de diferentes farmacias. Los precios aqu no tienen   en cuenta lo que podra costar con la ayuda del seguro (puede ser ms barato con su seguro), pero el sitio web puede darle el precio si no utiliz ningn seguro.  - Puede imprimir el cupn correspondiente y llevarlo con su receta a la farmacia.  - Tambin puede pasar por nuestra oficina durante el horario de atencin regular y recoger una tarjeta de cupones de GoodRx.  - Si necesita que su receta se enve electrnicamente a una farmacia diferente, informe a nuestra oficina a travs de MyChart de [place] o por telfono llamando al [phone] y presione la opcin 4.  

## 2022-01-16 ENCOUNTER — Encounter: Payer: Self-pay | Admitting: Dermatology

## 2022-02-12 ENCOUNTER — Ambulatory Visit: Payer: BC Managed Care – PPO | Admitting: Dermatology

## 2022-02-15 ENCOUNTER — Ambulatory Visit: Payer: BC Managed Care – PPO | Admitting: Dermatology

## 2022-02-15 VITALS — Wt 215.0 lb

## 2022-02-15 DIAGNOSIS — L853 Xerosis cutis: Secondary | ICD-10-CM | POA: Diagnosis not present

## 2022-02-15 DIAGNOSIS — L709 Acne, unspecified: Secondary | ICD-10-CM | POA: Diagnosis not present

## 2022-02-15 DIAGNOSIS — L732 Hidradenitis suppurativa: Secondary | ICD-10-CM | POA: Diagnosis not present

## 2022-02-15 DIAGNOSIS — K130 Diseases of lips: Secondary | ICD-10-CM | POA: Diagnosis not present

## 2022-02-15 DIAGNOSIS — Z79899 Other long term (current) drug therapy: Secondary | ICD-10-CM

## 2022-02-15 MED ORDER — ISOTRETINOIN 30 MG PO CAPS: 30.0000 mg | ORAL_CAPSULE | Freq: Every day | ORAL | 0 refills | Status: DC

## 2022-02-15 NOTE — Progress Notes (Signed)
Isotretinoin Follow-Up Visit   Subjective  Sara Foley is a 56 y.o. female who presents for the following: Hidradenitis suppurativa (Bil axilla, groin, buttocks, 65m f/u, Isotretinoin 20mg  1 po qd, wk 4 30mg  1 po qd).  Week # 4   Isotretinoin F/U - 02/15/22 1100       Isotretinoin Follow Up   iPledge #    Date 02/15/22    Weight 215 lb (97.5 kg)    Acne breakouts since last visit? No      Dosage   Target Dosage (mg) 19780    Current (To Date) Dosage (mg) 600    To Go Dosage (mg) 19180      Side Effects   Skin WNL    Gastrointestinal WNL    Neurological WNL            Side effects: Dry skin, dry lips  Denies changes in night vision, shortness of breath, abdominal pain, nausea, vomiting, diarrhea, blood in stool or urine, visual changes, headaches, epistaxis, joint pain, myalgias, mood changes, depression, or suicidal ideation.   The following portions of the chart were reviewed this encounter and updated as appropriate: medications, allergies, medical history  Review of Systems:  No other skin or systemic complaints except as noted in HPI or Assessment and Plan.  Objective  Well appearing patient in no apparent distress; mood and affect are within normal limits.  An examination of the face, neck, chest, and back was performed and relevant findings are noted below.   bil axilla, groin, buttocks Scarring, fistula formation and keloids of right axilla and left axilla, scarring at medial thighs/groin. Large scar at post left thigh secondary to HS surgery   Assessment & Plan   Hidradenitis suppurativa -Acne bil axilla, groin, buttocks  Hidradenitis Suppurativa is a chronic; persistent; non-curable, but treatable condition due to abnormal inflamed sweat glands in the body folds (axilla, inframammary, groin, medial thighs), causing recurrent painful draining cysts and scarring. It can be associated with severe scarring acne and cysts; also abscesses and  scarring of scalp. The goal is control and prevention of flares, as it is not curable. Scars are permanent and can be thickened. Treatment may include daily use of topical medication and oral antibiotics.  Oral isotretinoin may also be helpful.  For more severe cases, Humira (a biologic injection) may be prescribed to decrease the inflammatory process and prevent flares.  When indicated, inflamed cysts may also be treated surgically.  Severe; On Isotretinoin -  requiring FDA mandated monthly evaluations and laboratory monitoring; Chronic and Persistent; Not to Goal  Wk 4 Isotretinoin iPledge: [id number] Pharmacy: 02/17/[address] BC: hysterectomy Total mg = 600 Total mg/kg = 6.15mg /kg  Increase to Isotretinoin 30mg  1 po qd with fatty meal #30 0rf Pt confirmed in IPLEDGE program.  ISOtretinoin (ACCUTANE) 30 MG capsule - bil axilla, groin, buttocks Take 1 capsule (30 mg total) by mouth daily. Take with fatty meal  Xerosis secondary to isotretinoin therapy - Continue emollients as directed  Cheilitis secondary to isotretinoin therapy - Continue lip balm as directed, Dr. [phone] Cortibalm recommended  Long term medication management (isotretinoin) - While taking Isotretinoin and for 30 days after you finish the medication, do not share pills, do not donate blood. Isotretinoin is best absorbed when taken with a fatty meal. Isotretinoin can make you sensitive to the sun. Daily careful sun protection including sunscreen SPF 30+ when outdoors is recommended.  Follow-up in 30 days.  I, Sonya Hupman,  RMA, am acting as scribe for Sarina Ser, MD . Documentation: I have reviewed the above documentation for accuracy and completeness, and I agree with the above.  Sarina Ser, MD

## 2022-02-22 ENCOUNTER — Ambulatory Visit
Admission: RE | Admit: 2022-02-22 | Discharge: 2022-02-22 | Disposition: A | Discharge status: Home / Self Care | Payer: BC Managed Care – PPO | Source: Ambulatory Visit | Attending: Family Medicine | Admitting: Family Medicine

## 2022-02-22 DIAGNOSIS — Z1231 Encounter for screening mammogram for malignant neoplasm of breast: Secondary | ICD-10-CM | POA: Diagnosis present

## 2022-02-24 ENCOUNTER — Encounter: Payer: Self-pay | Admitting: Dermatology

## 2022-03-01 LAB — HM DIABETES EYE EXAM

## 2022-03-28 ENCOUNTER — Ambulatory Visit: Payer: BC Managed Care – PPO | Admitting: Dermatology

## 2022-04-23 NOTE — Progress Notes (Unsigned)
Name: Sara Foley   MRN: [id number]    DOB: 1966-03-22   Date:04/24/2022       Progress Note  Subjective  Chief Complaint  Follow Up  HPI  HTN: bp is controlled with Losartan 100 mg , denies side effects of medication, continue current medications. No chest pain, palpitation, dizziness  or sob. Doing well    Hydradenitis suppurative: she had surgery done by Dr. Lady Gary Nov 2020.  She was seen for Dr. Reola Mosher due to increase in pain due to a lesion on her vulva, it was drained and took antibiotics. She is seeing Dermatologist now and is taking Accutane but states it is costly to see him monthly due to high co-pay plus the cost of medication. She states it is working, advised her to discuss other options with him.   Diabetes type II: she had gestational diabetes , followed by many years of pre-diabetes, in July of 2021 A1C spiked to 6.6 % , today is 6.8 %  .DM associated HTN, dyslipidemia, and obesity. She could not tolerate Metformin - it caused diarrhea, I gave her Rybelsus last visit but she has not been taking it. She is not sure what happened. We will send another rx but if not able to afford it she needs to let me know so we can change medication.    Vitamin D and B12 deficiency: not taking supplementation, advised importance of resuming . Recheck levels next visit   Dyslipidemia: Low HDL, discussed increasing fish and tree nuts to her diet ,tolerating Atorvastatin and we will recheck labs next visit   Morbid obesity: she has a BMI over 35, with co-morbidities, HTN, dyslipidemia and metabolic syndrome. She will try Rybelsus now    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 2 diabetes mellitus (HCC) 06/21/2021   Metabolic syndrome 02/28/2016   Low HDL (under 40) 02/28/2016   History of hysterectomy leaving cervix intact 01/19/2016   Vitamin D deficiency 01/18/2016   Vitamin B12 deficiency 01/18/2016   History of  gestational diabetes 01/17/2016   Hypertension, benign 01/17/2016   Hidradenitis suppurativa 01/17/2016   History of iron deficiency anemia 01/17/2016    Past Surgical History:  Procedure Laterality Date   BREAST BIOPSY Left 04/14/2012   left breast stereotactic biopsy; fibroadenoma   BREAST REDUCTION SURGERY  1999   HYDRADENITIS EXCISION Left 06/05/2019   Procedure: EXCISION HIDRADENITIS GROIN;  Surgeon: Duanne Guess, MD;  Location: [facility] OR[facility];  Service: General;  Laterality: Left;   REDUCTION MAMMAPLASTY Bilateral 2000   SUPRACERVICAL ABDOMINAL HYSTERECTOMY  01/2012   due to fibroid tumors    Family History  Problem Relation Age of Onset   Prostate cancer Other    Colon cancer Maternal Uncle    Diabetes Mother    Arthritis/Rheumatoid Mother    Leukemia Father     Social History   Tobacco Use   Smoking status: Never   Smokeless tobacco: Never  Substance Use Topics   Alcohol use: Yes    Alcohol/week: 3.0 standard drinks of alcohol    Types: 3 Standard drinks or equivalent per week     Current Outpatient Medications:    atorvastatin (LIPITOR) 10 MG tablet, Take 1 tablet (10 mg total) by mouth daily., Disp: 90 tablet, Rfl: 1   ISOtretinoin (ACCUTANE) 30 MG capsule, Take 1 capsule (30 mg total) by mouth daily. Take with fatty meal, Disp: 30 capsule, Rfl: 0  losartan (COZAAR) 100 MG tablet, Take 1 tablet (100 mg total) by mouth daily., Disp: 90 tablet, Rfl: 1   Semaglutide (RYBELSUS) 7 MG TABS, Take 7 mg by mouth daily., Disp: 90 tablet, Rfl: 0   Cyanocobalamin (B-12) 1000 MCG LOZG, Place 1 tablet under the tongue daily at 12 noon. DISSOLVE ONE TABLET UNDER THE TONGUE DAILY (Patient not taking: Reported on 04/24/2022), Disp: 100 lozenge, Rfl: 1  Allergies  Allergen Reactions   Metformin Diarrhea    I personally reviewed active problem list, medication list, allergies, family history, social history, health maintenance with the patient/caregiver  today.   ROS  Constitutional: Negative for fever or weight change.  Respiratory: Negative for cough and shortness of breath.   Cardiovascular: Negative for chest pain or palpitations.  Gastrointestinal: Negative for abdominal pain, no bowel changes.  Musculoskeletal: Negative for gait problem or joint swelling.  Skin: Negative for rash.  Neurological: Negative for dizziness or headache.  No other specific complaints in a complete review of systems (except as listed in HPI above).   Objective  Vitals:   04/24/22 0838  BP: 126/74  Pulse: 94  Resp: 16  SpO2: 98%  Weight: 219 lb (99.3 kg)  Height: 5\' 5"  (1.651 m)    Body mass index is 36.44 kg/m.  Physical Exam  Constitutional: Patient appears well-developed and well-nourished. Obese  No distress.  HEENT: head atraumatic, normocephalic, pupils equal and reactive to light, neck supple Cardiovascular: Normal rate, regular rhythm and normal heart sounds.  No murmur heard. No BLE edema. Pulmonary/Chest: Effort normal and breath sounds normal. No respiratory distress. Abdominal: Soft.  There is no tenderness. Psychiatric: Patient has a normal mood and affect. behavior is normal. Judgment and thought content normal.  Recent Results (from the past 2160 hour(s))  POCT HgB A1C     Status: Abnormal   Collection Time: 04/24/22  8:40 AM  Result Value Ref Range   Hemoglobin A1C 6.7 (A) 4.0 - 5.6 %   HbA1c POC (<> result, manual entry)     HbA1c, POC (prediabetic range)     HbA1c, POC (controlled diabetic range)       PHQ2/9:    04/24/2022    8:37 AM 12/20/2021    8:17 AM 12/11/2021    8:14 AM 07/25/2021    9:05 AM 06/21/2021    9:52 AM  Depression screen PHQ 2/9  Decreased Interest 0 0 0 0 0  Down, Depressed, Hopeless 0 0 0 0 0  PHQ - 2 Score 0 0 0 0 0  Altered sleeping 0 0 0 0 0  Tired, decreased energy 0 0 0 0 0  Change in appetite 0 0 0 0 0  Feeling bad or failure about yourself  0 0 0 0 0  Trouble concentrating 0 0 0  0 0  Moving slowly or fidgety/restless 0 0 0 0 0  Suicidal thoughts 0 0 0 0 0  PHQ-9 Score 0 0 0 0 0  Difficult doing work/chores   Not difficult at all  Not difficult at all    phq 9 is negative   Fall Risk:    04/24/2022    8:37 AM 12/20/2021    8:17 AM 12/11/2021    8:14 AM 07/25/2021    9:04 AM 06/21/2021    9:51 AM  Fall Risk   Falls in the past year? 0 0 0 0 0  Number falls in past yr: 0 0 0 0 0  Injury with Fall? 0  0 0 0 0  Risk for fall due to : No Fall Risks No Fall Risks  No Fall Risks No Fall Risks  Follow up Falls prevention discussed Falls prevention discussed  Falls prevention discussed Falls prevention discussed     Functional Status Survey: Is the patient deaf or have difficulty hearing?: No Does the patient have difficulty seeing, even when wearing glasses/contacts?: No Does the patient have difficulty concentrating, remembering, or making decisions?: No Does the patient have difficulty walking or climbing stairs?: No Does the patient have difficulty dressing or bathing?: No Does the patient have difficulty doing errands alone such as visiting a doctor's office or shopping?: No    Assessment & Plan  1. Dyslipidemia due to type 2 diabetes mellitus (HCC)  - POCT HgB A1C - Urine Microalbumin w/creat. ratio - Semaglutide (RYBELSUS) 7 MG TABS; Take 7 mg by mouth daily.  Dispense: 90 tablet; Refill: 0 - atorvastatin (LIPITOR) 10 MG tablet; Take 1 tablet (10 mg total) by mouth daily.  Dispense: 90 tablet; Refill: 1  2. Morbid obesity ([place])  Discussed with the patient the risk posed by an increased BMI. Discussed importance of portion control, calorie counting and at least 150 minutes of physical activity weekly. Avoid sweet beverages and drink more water. Eat at least 6 servings of fruit and vegetables daily    3. Vitamin D deficiency   4. Vitamin B12 deficiency  - Cyanocobalamin (B-12) 1000 MCG LOZG; Place 1 tablet under the tongue daily at 12 noon.  DISSOLVE ONE TABLET UNDER THE TONGUE DAILY  Dispense: 100 lozenge; Refill: 1  5. Hypertension, benign  - losartan (COZAAR) 100 MG tablet; Take 1 tablet (100 mg total) by mouth daily.  Dispense: 90 tablet; Refill: 1  6. Hidradenitis suppurativa   7. Low HDL (under 40)  - atorvastatin (LIPITOR) 10 MG tablet; Take 1 tablet (10 mg total) by mouth daily.  Dispense: 90 tablet; Refill: 1  8. Needs flu shot  Refused

## 2022-04-24 ENCOUNTER — Encounter: Payer: Self-pay | Admitting: Family Medicine

## 2022-04-24 ENCOUNTER — Ambulatory Visit (INDEPENDENT_AMBULATORY_CARE_PROVIDER_SITE_OTHER): Payer: BC Managed Care – PPO | Admitting: Family Medicine

## 2022-04-24 VITALS — BP 126/74 | HR 94 | Resp 16 | Ht 65.0 in | Wt 219.0 lb

## 2022-04-24 DIAGNOSIS — E559 Vitamin D deficiency, unspecified: Secondary | ICD-10-CM | POA: Diagnosis not present

## 2022-04-24 DIAGNOSIS — E538 Deficiency of other specified B group vitamins: Secondary | ICD-10-CM

## 2022-04-24 DIAGNOSIS — L732 Hidradenitis suppurativa: Secondary | ICD-10-CM

## 2022-04-24 DIAGNOSIS — Z23 Encounter for immunization: Secondary | ICD-10-CM

## 2022-04-24 DIAGNOSIS — E1169 Type 2 diabetes mellitus with other specified complication: Secondary | ICD-10-CM

## 2022-04-24 DIAGNOSIS — I10 Essential (primary) hypertension: Secondary | ICD-10-CM

## 2022-04-24 DIAGNOSIS — E785 Hyperlipidemia, unspecified: Secondary | ICD-10-CM

## 2022-04-24 DIAGNOSIS — E786 Lipoprotein deficiency: Secondary | ICD-10-CM

## 2022-04-24 LAB — POCT GLYCOSYLATED HEMOGLOBIN (HGB A1C): Hemoglobin A1C: 6.7 % — AB (ref 4.0–5.6)

## 2022-04-24 MED ORDER — ATORVASTATIN CALCIUM 10 MG PO TABS: 10.0000 mg | ORAL_TABLET | Freq: Every day | ORAL | 1 refills | Status: DC

## 2022-04-24 MED ORDER — RYBELSUS 7 MG PO TABS: 7.0000 mg | ORAL_TABLET | Freq: Every day | ORAL | 0 refills | Status: DC

## 2022-04-24 MED ORDER — LOSARTAN POTASSIUM 100 MG PO TABS: 100.0000 mg | ORAL_TABLET | Freq: Every day | ORAL | 1 refills | Status: DC

## 2022-04-24 MED ORDER — B-12 1000 MCG PO LOZG: 1.0000 | LOZENGE | Freq: Every day | ORAL | 1 refills | Status: DC

## 2022-07-19 NOTE — Progress Notes (Unsigned)
Name: Sara Foley   MRN: [id number]    DOB: [birth year]   Date:07/20/2022       Progress Note  Subjective  Chief Complaint  Follow Up  HPI  HTN: bp is controlled with Losartan , she denies side effects of medication, continue current medications. No chest pain, palpitation, dizziness  or sob.   Tachycardia: heart rate remained elevated throughout her visit, denies palpitation, she had coffee before she came in, EKG sinus tachy, we will check TSH and CBC, advised to cut down on caffeine intake and we will consider beta blockers if heart rate remains elevated without a cause    Hydradenitis suppurative: she had surgery done by Dr. Lady Gary Nov 2020.  She was seen for Dr. Reola Mosher due to increase in pain due to a lesion on her vulva, it was drained and took antibiotics. She is seeing Dermatologist now and she was  taking Accutane but states it is costly to see him monthly due to high co-pay plus the cost of medication. Currently only taking medication prn   Diabetes type II: she had gestational diabetes , followed by many years of pre-diabetes, in July of 2021 A1C spiked to 6.6 % , last visit it was 6.8 % and today is up to 7.2 %   She has DM associated HTN, dyslipidemia, and obesity. She could not tolerate Metformin - it caused diarrhea, I gave her Rybelsus months ago but she has not been taking. Discussed with patient importance of glucose control to decrease risk of diabetic associated complications.    Vitamin D and B12 deficiency: she is taking supplements more often    Dyslipidemia: Low HDL, discussed increasing fish and tree nuts to her diet ,tolerating Atorvastatin and we will recheck labs today   Morbid obesity: she has a BMI over 35, with co-morbidities, HTN, dyslipidemia and metabolic syndrome. She states she thinks she has Rybelsus at home and will try taking it now   Post-nasal drainage: started yesterday, no fever, chills, change in appetite, but has a cough , advised rapid covid  test since we will be closed and we will not see results until Tuesday   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 2 diabetes mellitus (HCC) 06/21/2021   Metabolic syndrome 02/28/2016   Low HDL (under 40) 02/28/2016   History of hysterectomy leaving cervix intact 01/19/2016   Vitamin D deficiency 01/18/2016   Vitamin B12 deficiency 01/18/2016   History of gestational diabetes 01/17/2016   Hypertension, benign 01/17/2016   Hidradenitis suppurativa 01/17/2016   History of iron deficiency anemia 01/17/2016    Past Surgical History:  Procedure Laterality Date   BREAST BIOPSY Left 04/14/2012   left breast stereotactic biopsy; fibroadenoma   BREAST REDUCTION SURGERY  1999   HYDRADENITIS EXCISION Left 06/05/2019   Procedure: EXCISION HIDRADENITIS GROIN;  Surgeon: Duanne Guess, MD;  Location: [facility];  Service: General;  Laterality: Left;   REDUCTION MAMMAPLASTY Bilateral 2000   SUPRACERVICAL ABDOMINAL HYSTERECTOMY  01/2012   due to fibroid tumors    Family History  Problem Relation Age of Onset   Prostate cancer Other    Colon cancer Maternal Uncle    Diabetes Mother    Arthritis/Rheumatoid Mother    Leukemia Father     Social History   Tobacco Use   Smoking status: Never   Smokeless tobacco: Never  Substance Use Topics   Alcohol use: Yes  Alcohol/week: 3.0 standard drinks of alcohol    Types: 3 Standard drinks or equivalent per week     Current Outpatient Medications:    atorvastatin (LIPITOR) 10 MG tablet, Take 1 tablet (10 mg total) by mouth daily., Disp: 90 tablet, Rfl: 1   ISOtretinoin (ACCUTANE) 30 MG capsule, Take 1 capsule (30 mg total) by mouth daily. Take with fatty meal, Disp: 30 capsule, Rfl: 0   losartan (COZAAR) 100 MG tablet, Take 1 tablet (100 mg total) by mouth daily., Disp: 90 tablet, Rfl: 1   Cyanocobalamin (B-12) 1000 MCG LOZG, Place 1 tablet under the tongue daily at 12  noon. DISSOLVE ONE TABLET UNDER THE TONGUE DAILY (Patient not taking: Reported on 07/20/2022), Disp: 100 lozenge, Rfl: 1   Semaglutide (RYBELSUS) 7 MG TABS, Take 7 mg by mouth daily. (Patient not taking: Reported on 07/20/2022), Disp: 90 tablet, Rfl: 0  Allergies  Allergen Reactions   Metformin Diarrhea    I personally reviewed active problem list, medication list, allergies, family history, social history, health maintenance with the patient/caregiver today.   ROS  Constitutional: Negative for fever or weight change.  Respiratory: Negative for cough and shortness of breath.   Cardiovascular: Negative for chest pain or palpitations.  Gastrointestinal: Negative for abdominal pain, no bowel changes.  Musculoskeletal: Negative for gait problem or joint swelling.  Skin: Negative for rash.  Neurological: Negative for dizziness or headache.  No other specific complaints in a complete review of systems (except as listed in HPI above).   Objective  Vitals:   07/20/22 1151  BP: 128/76  Pulse: (!) 113  Resp: 16  Temp: 97.8 F (36.6 C)  TempSrc: Oral  SpO2: 96%  Weight: 220 lb 9.6 oz (100.1 kg)  Height: 5\' 5"  (1.651 m)    Body mass index is 36.71 kg/m.  Physical Exam  Constitutional: Patient appears well-developed and well-nourished. Obese  No distress.  HEENT: head atraumatic, normocephalic, pupils equal and reactive to light, ears normal TM,, neck supple, throat within normal limits Cardiovascular: Normal rate, regular rhythm and normal heart sounds.  No murmur heard. No BLE edema. Pulmonary/Chest: Effort normal and breath sounds normal. No respiratory distress. Abdominal: Soft.  There is no tenderness. Psychiatric: Patient has a normal mood and affect. behavior is normal. Judgment and thought content normal.   Recent Results (from the past 2160 hour(s))  POCT HgB A1C     Status: Abnormal   Collection Time: 04/24/22  8:40 AM  Result Value Ref Range   Hemoglobin A1C 6.7 (A)  4.0 - 5.6 %   HbA1c POC (<> result, manual entry)     HbA1c, POC (prediabetic range)     HbA1c, POC (controlled diabetic range)    POCT HgB A1C     Status: Abnormal   Collection Time: 07/20/22 12:02 PM  Result Value Ref Range   Hemoglobin A1C 7.2 (A) 4.0 - 5.6 %   HbA1c POC (<> result, manual entry)     HbA1c, POC (prediabetic range)     HbA1c, POC (controlled diabetic range)      Diabetic Foot Exam: Diabetic Foot Exam - Simple   Simple Foot Form Visual Inspection See comments: Yes Sensation Testing Intact to touch and monofilament testing bilaterally: Yes Pulse Check Posterior Tibialis and Dorsalis pulse intact bilaterally: Yes Comments Corn and callus formation       PHQ2/9:    07/20/2022   11:51 AM 04/24/2022    8:37 AM 12/20/2021    8:17 AM 12/11/2021  8:14 AM 07/25/2021    9:05 AM  Depression screen PHQ 2/9  Decreased Interest 0 0 0 0 0  Down, Depressed, Hopeless 0 0 0 0 0  PHQ - 2 Score 0 0 0 0 0  Altered sleeping 0 0 0 0 0  Tired, decreased energy 0 0 0 0 0  Change in appetite 0 0 0 0 0  Feeling bad or failure about yourself  0 0 0 0 0  Trouble concentrating 0 0 0 0 0  Moving slowly or fidgety/restless 0 0 0 0 0  Suicidal thoughts 0 0 0 0 0  PHQ-9 Score 0 0 0 0 0  Difficult doing work/chores Not difficult at all   Not difficult at all     phq 9 is negative   Fall Risk:    07/20/2022   11:51 AM 04/24/2022    8:37 AM 12/20/2021    8:17 AM 12/11/2021    8:14 AM 07/25/2021    9:04 AM  Fall Risk   Falls in the past year? 0 0 0 0 0  Number falls in past yr: 0 0 0 0 0  Injury with Fall? 0 0 0 0 0  Risk for fall due to : No Fall Risks No Fall Risks No Fall Risks  No Fall Risks  Follow up Falls prevention discussed;Education provided;Falls evaluation completed Falls prevention discussed Falls prevention discussed  Falls prevention discussed      Functional Status Survey: Is the patient deaf or have difficulty hearing?: No Does the patient have  difficulty seeing, even when wearing glasses/contacts?: No Does the patient have difficulty concentrating, remembering, or making decisions?: No Does the patient have difficulty walking or climbing stairs?: No Does the patient have difficulty dressing or bathing?: No Does the patient have difficulty doing errands alone such as visiting a doctor's office or shopping?: No    Assessment & Plan    1. Dyslipidemia due to type 2 diabetes mellitus (HCC)  - POCT HgB A1C - HM Diabetes Foot Exam - Urine Microalbumin w/creat. ratio - COMPLETE METABOLIC PANEL WITH GFR  2. Low HDL (under 40)  - Lipid Profile  3. Sinus tachycardia  - CBC with Differential/Platelet - TSH  4. Morbid obesity (HCC)  Discussed with the patient the risk posed by an increased BMI. Discussed importance of portion control, calorie counting and at least 150 minutes of physical activity weekly. Avoid sweet beverages and drink more water. Eat at least 6 servings of fruit and vegetables daily    5. Vitamin D deficiency   6. Hidradenitis suppurativa   7. Vitamin B12 deficiency   8. Hypertension, benign  At goal , continue medication

## 2022-07-20 ENCOUNTER — Encounter: Payer: Self-pay | Admitting: Family Medicine

## 2022-07-20 ENCOUNTER — Ambulatory Visit: Payer: BC Managed Care – PPO | Admitting: Family Medicine

## 2022-07-20 ENCOUNTER — Other Ambulatory Visit: Payer: Self-pay | Admitting: Family Medicine

## 2022-07-20 VITALS — BP 128/76 | HR 113 | Temp 97.8°F | Resp 16 | Ht 65.0 in | Wt 220.6 lb

## 2022-07-20 DIAGNOSIS — E786 Lipoprotein deficiency: Secondary | ICD-10-CM | POA: Diagnosis not present

## 2022-07-20 DIAGNOSIS — E785 Hyperlipidemia, unspecified: Secondary | ICD-10-CM | POA: Diagnosis not present

## 2022-07-20 DIAGNOSIS — E538 Deficiency of other specified B group vitamins: Secondary | ICD-10-CM

## 2022-07-20 DIAGNOSIS — R000 Tachycardia, unspecified: Secondary | ICD-10-CM

## 2022-07-20 DIAGNOSIS — L732 Hidradenitis suppurativa: Secondary | ICD-10-CM

## 2022-07-20 DIAGNOSIS — E1169 Type 2 diabetes mellitus with other specified complication: Secondary | ICD-10-CM | POA: Diagnosis not present

## 2022-07-20 DIAGNOSIS — E559 Vitamin D deficiency, unspecified: Secondary | ICD-10-CM

## 2022-07-20 DIAGNOSIS — I10 Essential (primary) hypertension: Secondary | ICD-10-CM

## 2022-07-20 LAB — POCT GLYCOSYLATED HEMOGLOBIN (HGB A1C): Hemoglobin A1C: 7.2 % — AB (ref 4.0–5.6)

## 2022-07-21 LAB — COMPLETE METABOLIC PANEL WITH GFR
AG Ratio: 1 (calc) (ref 1.0–2.5)
ALT: 17 U/L (ref 6–29)
AST: 14 U/L (ref 10–35)
Albumin: 3.9 g/dL (ref 3.6–5.1)
Alkaline phosphatase (APISO): 135 U/L (ref 37–153)
BUN: 12 mg/dL (ref 7–25)
CO2: 25 mmol/L (ref 20–32)
Calcium: 9.1 mg/dL (ref 8.6–10.4)
Chloride: 103 mmol/L (ref 98–110)
Creat: 0.75 mg/dL (ref 0.50–1.03)
Globulin: 3.8 g/dL (calc) — ABNORMAL HIGH (ref 1.9–3.7)
Glucose, Bld: 121 mg/dL — ABNORMAL HIGH (ref 65–99)
Potassium: 3.8 mmol/L (ref 3.5–5.3)
Sodium: 138 mmol/L (ref 135–146)
Total Bilirubin: 0.5 mg/dL (ref 0.2–1.2)
Total Protein: 7.7 g/dL (ref 6.1–8.1)
eGFR: 95 mL/min/{1.73_m2} (ref >=60)

## 2022-07-21 LAB — CBC WITH DIFFERENTIAL/PLATELET
Absolute Monocytes: 456 cells/uL (ref 200–950)
Basophils Absolute: 20 cells/uL (ref 0–200)
Basophils Relative: 0.3 %
Eosinophils Absolute: 136 cells/uL (ref 15–500)
Eosinophils Relative: 2 %
HCT: 40 % (ref 35.0–45.0)
Hemoglobin: 13.2 g/dL (ref 11.7–15.5)
Lymphs Abs: 1047 cells/uL (ref 850–3900)
MCH: 26.8 pg — ABNORMAL LOW (ref 27.0–33.0)
MCHC: 33 g/dL (ref 32.0–36.0)
MCV: 81.1 fL (ref 80.0–100.0)
MPV: 10.8 fL (ref 7.5–12.5)
Monocytes Relative: 6.7 %
Neutro Abs: 5141 cells/uL (ref 1500–7800)
Neutrophils Relative %: 75.6 %
Platelets: 275 10*3/uL (ref 140–400)
RBC: 4.93 10*6/uL (ref 3.80–5.10)
RDW: 13.2 % (ref 11.0–15.0)
Total Lymphocyte: 15.4 %
WBC: 6.8 10*3/uL (ref 3.8–10.8)

## 2022-07-21 LAB — TSH: TSH: 1.71 mIU/L

## 2022-07-21 LAB — MICROALBUMIN / CREATININE URINE RATIO
Creatinine, Urine: 227 mg/dL (ref 20–275)
Microalb Creat Ratio: 5 mcg/mg creat (ref <30)
Microalb, Ur: 1.2 mg/dL

## 2022-07-21 LAB — LIPID PANEL
Cholesterol: 92 mg/dL (ref <200)
HDL: 31 mg/dL — ABNORMAL LOW (ref >=50)
LDL Cholesterol (Calc): 45 mg/dL (calc)
Non-HDL Cholesterol (Calc): 61 mg/dL (calc) (ref <130)
Total CHOL/HDL Ratio: 3 (calc) (ref <5.0)
Triglycerides: 76 mg/dL (ref <150)

## 2022-07-31 NOTE — Patient Instructions (Signed)
Preventive Care 40-55 Years Old, Female Preventive care refers to lifestyle choices and visits with your health care provider that can promote health and wellness. Preventive care visits are also called wellness exams. What can I expect for my preventive care visit? Counseling Your health care provider may ask you questions about your: Medical history, including: Past medical problems. Family medical history. Pregnancy history. Current health, including: Menstrual cycle. Method of birth control. Emotional well-being. Home life and relationship well-being. Sexual activity and sexual health. Lifestyle, including: Alcohol, nicotine or tobacco, and drug use. Access to firearms. Diet, exercise, and sleep habits. Work and work environment. Sunscreen use. Safety issues such as seatbelt and bike helmet use. Physical exam Your health care provider will check your: Height and weight. These may be used to calculate your BMI (body mass index). BMI is a measurement that tells if you are at a healthy weight. Waist circumference. This measures the distance around your waistline. This measurement also tells if you are at a healthy weight and may help predict your risk of certain diseases, such as type 2 diabetes and high blood pressure. Heart rate and blood pressure. Body temperature. Skin for abnormal spots. What immunizations do I need?  Vaccines are usually given at various ages, according to a schedule. Your health care provider will recommend vaccines for you based on your age, medical history, and lifestyle or other factors, such as travel or where you work. What tests do I need? Screening Your health care provider may recommend screening tests for certain conditions. This may include: Lipid and cholesterol levels. Diabetes screening. This is done by checking your blood sugar (glucose) after you have not eaten for a while (fasting). Pelvic exam and Pap test. Hepatitis B test. Hepatitis C  test. HIV (human immunodeficiency virus) test. STI (sexually transmitted infection) testing, if you are at risk. Lung cancer screening. Colorectal cancer screening. Mammogram. Talk with your health care provider about when you should start having regular mammograms. This may depend on whether you have a family history of breast cancer. BRCA-related cancer screening. This may be done if you have a family history of breast, ovarian, tubal, or peritoneal cancers. Bone density scan. This is done to screen for osteoporosis. Talk with your health care provider about your test results, treatment options, and if necessary, the need for more tests. Follow these instructions at home: Eating and drinking  Eat a diet that includes fresh fruits and vegetables, whole grains, lean protein, and low-fat dairy products. Take vitamin and mineral supplements as recommended by your health care provider. Do not drink alcohol if: Your health care provider tells you not to drink. You are pregnant, may be pregnant, or are planning to become pregnant. If you drink alcohol: Limit how much you have to 0-1 drink a day. Know how much alcohol is in your drink. In the [place] one drink equals one 12 oz bottle of beer (355 mL), one 5 oz glass of wine (148 mL), or one 1 oz glass of hard liquor (44 mL). Lifestyle Brush your teeth every morning and night with fluoride toothpaste. Floss one time each day. Exercise for at least 30 minutes 5 or more days each week. Do not use any products that contain nicotine or tobacco. These products include cigarettes, chewing tobacco, and vaping devices, such as e-cigarettes. If you need help quitting, ask your health care provider. Do not use drugs. If you are sexually active, practice safe sex. Use a condom or other form of protection to   prevent STIs. If you do not wish to become pregnant, use a form of birth control. If you plan to become pregnant, see your health care provider for a  prepregnancy visit. Take aspirin only as told by your health care provider. Make sure that you understand how much to take and what form to take. Work with your health care provider to find out whether it is safe and beneficial for you to take aspirin daily. Find healthy ways to manage stress, such as: Meditation, yoga, or listening to music. Journaling. Talking to a trusted person. Spending time with friends and family. Minimize exposure to UV radiation to reduce your risk of skin cancer. Safety Always wear your seat belt while driving or riding in a vehicle. Do not drive: If you have been drinking alcohol. Do not ride with someone who has been drinking. When you are tired or distracted. While texting. If you have been using any mind-altering substances or drugs. Wear a helmet and other protective equipment during sports activities. If you have firearms in your house, make sure you follow all gun safety procedures. Seek help if you have been physically or sexually abused. What's next? Visit your health care provider once a year for an annual wellness visit. Ask your health care provider how often you should have your eyes and teeth checked. Stay up to date on all vaccines. This information is not intended to replace advice given to you by your health care provider. Make sure you discuss any questions you have with your health care provider. Document Revised: 01/11/2021 Document Reviewed: 01/11/2021 Elsevier Patient Education  [place].

## 2022-07-31 NOTE — Progress Notes (Unsigned)
Name: Sara Foley   MRN: [id number]    DOB: Nov 09, 1965   Date:08/01/2022       Progress Note  Subjective  Chief Complaint  Annual Exam  HPI  Patient presents for annual CPE.  Diet: she needs to stop drinking sweet beverages  Exercise: discussed regular physical activity   Last Eye Exam: up to date  Last Dental Exam: due for an exam   [place] Office Visit from 08/01/2022 in [facility]  AUDIT-C Score 2      Depression: Phq 9 is  negative    08/01/2022    8:31 AM 07/20/2022   11:51 AM 04/24/2022    8:37 AM 12/20/2021    8:17 AM 12/11/2021    8:14 AM  Depression screen PHQ 2/9  Decreased Interest 0 0 0 0 0  Down, Depressed, Hopeless 0 0 0 0 0  PHQ - 2 Score 0 0 0 0 0  Altered sleeping 0 0 0 0 0  Tired, decreased energy 0 0 0 0 0  Change in appetite 0 0 0 0 0  Feeling bad or failure about yourself  0 0 0 0 0  Trouble concentrating 0 0 0 0 0  Moving slowly or fidgety/restless 0 0 0 0 0  Suicidal thoughts 0 0 0 0 0  PHQ-9 Score 0 0 0 0 0  Difficult doing work/chores  Not difficult at all   Not difficult at all   Hypertension: BP Readings from Last 3 Encounters:  08/01/22 124/76  07/20/22 128/76  04/24/22 126/74   Obesity: Wt Readings from Last 3 Encounters:  08/01/22 219 lb (99.3 kg)  07/20/22 220 lb 9.6 oz (100.1 kg)  04/24/22 219 lb (99.3 kg)   BMI Readings from Last 3 Encounters:  08/01/22 36.44 kg/m  07/20/22 36.71 kg/m  04/24/22 36.44 kg/m     Vaccines:   Tdap: up to date Shingrix: up to date Pneumonia: N/A Flu: refused  COVID-19: up to date   Hep C Screening: 02/24/20 STD testing and prevention (HIV/chl/gon/syphilis): 01/17/16 Intimate partner violence: negative screen  Sexual History :not sexually active since she divorced her husband 10 years ago  Menstrual History/LMP/Abnormal Bleeding: supra cervix hysterectomy  Discussed importance of follow up if any post-menopausal bleeding: yes  Incontinence Symptoms: negative  for symptoms   Breast cancer:  - Last Mammogram: 02/22/22 - BRCA gene screening: N/A  Osteoporosis Prevention : Discussed high calcium and vitamin D supplementation, weight bearing exercises Bone density: N/A  Cervical cancer screening: 01/28/19  Skin cancer: Discussed monitoring for atypical lesions  Colorectal cancer: 12/10/21   Lung cancer:  Low Dose CT Chest recommended if Age 17-80 years, 20 pack-year currently smoking OR have quit w/in 15years. Patient does not qualify for screen   ECG: 07/20/22  Advanced Care Planning: A voluntary discussion about advance care planning including the explanation and discussion of advance directives.  Discussed health care proxy and Living will, and the patient was able to identify a health care proxy as oldest daughter Sara Foley .  Patient does not have a living will and power of attorney of health care   Lipids: Lab Results  Component Value Date   CHOL 92 07/20/2022   CHOL 113 06/21/2021   CHOL 124 07/19/2020   Lab Results  Component Value Date   HDL 31 (L) 07/20/2022   HDL 32 (L) 06/21/2021   HDL 39 (L) 07/19/2020   Lab Results  Component Value Date   LDLCALC 45  07/20/2022   LDLCALC 66 06/21/2021   LDLCALC 71 07/19/2020   Lab Results  Component Value Date   TRIG 76 07/20/2022   TRIG 70 06/21/2021   TRIG 67 07/19/2020   Lab Results  Component Value Date   CHOLHDL 3.0 07/20/2022   CHOLHDL 3.5 06/21/2021   CHOLHDL 3.2 07/19/2020   No results found for: "LDLDIRECT"  Glucose: Glucose, Bld  Date Value Ref Range Status  07/20/2022 121 (H) 65 - 99 mg/dL Final    Comment:    .            Fasting reference interval . For someone without known diabetes, a glucose value between 100 and 125 mg/dL is consistent with prediabetes and should be confirmed with a follow-up test. .   06/21/2021 104 (H) 65 - 99 mg/dL Final    Comment:    .            Fasting reference interval . For someone without known diabetes, a glucose  value between 100 and 125 mg/dL is consistent with prediabetes and should be confirmed with a follow-up test. .   07/19/2020 111 (H) 65 - 99 mg/dL Final    Comment:    .            Fasting reference interval . For someone without known diabetes, a glucose value between 100 and 125 mg/dL is consistent with prediabetes and should be confirmed with a follow-up test. .     Patient Active Problem List   Diagnosis Date Noted   Morbid obesity ([place]) 12/20/2021   Dyslipidemia due to type 2 diabetes mellitus ([place]) 37/10/8887   Metabolic syndrome 16/94/5038   Low HDL (under 40) 02/28/2016   History of hysterectomy leaving cervix intact 01/19/2016   Vitamin D deficiency 01/18/2016   Vitamin B12 deficiency 01/18/2016   History of gestational diabetes 01/17/2016   Hypertension, benign 01/17/2016   Hidradenitis suppurativa 01/17/2016   History of iron deficiency anemia 01/17/2016    Past Surgical History:  Procedure Laterality Date   BREAST BIOPSY Left 04/14/2012   left breast stereotactic biopsy; fibroadenoma   BREAST REDUCTION SURGERY  1999   HYDRADENITIS EXCISION Left 06/05/2019   Procedure: EXCISION HIDRADENITIS GROIN;  Surgeon: Fredirick Maudlin, MD;  Location: [facility] ORS;  Service: General;  Laterality: Left;   REDUCTION MAMMAPLASTY Bilateral 2000   SUPRACERVICAL ABDOMINAL HYSTERECTOMY  01/2012   due to fibroid tumors    Family History  Problem Relation Age of Onset   Prostate cancer Other    Colon cancer Maternal Uncle    Diabetes Mother    Arthritis/Rheumatoid Mother    Leukemia Father     Social History   Socioeconomic History   Marital status: Divorced    Spouse name: Not on file   Number of children: 3   Years of education: Not on file   Highest education level: Not on file  Occupational History   Not on file  Tobacco Use   Smoking status: Never   Smokeless tobacco: Never  Vaping Use   Vaping Use: Never used  Substance and Sexual Activity   Alcohol use:  Yes    Alcohol/week: 3.0 standard drinks of alcohol    Types: 3 Standard drinks or equivalent per week   Drug use: No   Sexual activity: Not Currently  Other Topics Concern   Not on file  Social History Narrative   Not on file   Social Determinants of Health   Financial Resource Strain: Low  Risk  (08/01/2022)   Overall Financial Resource Strain (CARDIA)    Difficulty of Paying Living Expenses: Not hard at all  Food Insecurity: No Food Insecurity (08/01/2022)   Hunger Vital Sign    Worried About Running Out of Food in the Last Year: Never true    Ran Out of Food in the Last Year: Never true  Transportation Needs: No Transportation Needs (08/01/2022)   PRAPARE - Hydrologist (Medical): No    Lack of Transportation (Non-Medical): No  Physical Activity: Insufficiently Active (08/01/2022)   Exercise Vital Sign    Days of Exercise per Week: 1 day    Minutes of Exercise per Session: 10 min  Stress: No Stress Concern Present (08/01/2022)   [place]    Feeling of Stress : Only a little  Social Connections: Moderately Integrated (08/01/2022)   Social Connection and Isolation Panel [NHANES]    Frequency of Communication with Friends and Family: More than three times a week    Frequency of Social Gatherings with Friends and Family: Twice a week    Attends Religious Services: More than 4 times per year    Active Member of [employer] or Organizations: Yes    Attends Music therapist: More than 4 times per year    Marital Status: Divorced  Intimate Partner Violence: Not At Risk (08/01/2022)   Humiliation, Afraid, Rape, and Kick questionnaire    Fear of Current or Ex-Partner: No    Emotionally Abused: No    Physically Abused: No    Sexually Abused: No     Current Outpatient Medications:    atorvastatin (LIPITOR) 10 MG tablet, Take 1 tablet (10 mg total) by mouth daily., Disp: 90 tablet, Rfl: 1    ISOtretinoin (ACCUTANE) 30 MG capsule, Take 1 capsule (30 mg total) by mouth daily. Take with fatty meal, Disp: 30 capsule, Rfl: 0   losartan (COZAAR) 100 MG tablet, Take 1 tablet (100 mg total) by mouth daily., Disp: 90 tablet, Rfl: 1   Semaglutide (RYBELSUS) 7 MG TABS, Take 7 mg by mouth daily., Disp: 90 tablet, Rfl: 0   Cyanocobalamin (B-12) 1000 MCG LOZG, Place 1 tablet under the tongue daily at 12 noon. DISSOLVE ONE TABLET UNDER THE TONGUE DAILY (Patient not taking: Reported on 07/20/2022), Disp: 100 lozenge, Rfl: 1  Allergies  Allergen Reactions   Metformin Diarrhea     ROS  Constitutional: Negative for fever or weight change.  Respiratory: Negative for cough and shortness of breath.   Cardiovascular: Negative for chest pain or palpitations.  Gastrointestinal: Negative for abdominal pain, no bowel changes.  Musculoskeletal: Negative for gait problem or joint swelling.  Skin: Negative for rash.  Neurological: Negative for dizziness or headache.  No other specific complaints in a complete review of systems (except as listed in HPI above).   Objective  Vitals:   08/01/22 0832  BP: 124/76  Pulse: 91  Resp: 16  SpO2: 98%  Weight: 219 lb (99.3 kg)  Height: _0  (1.651 m)    Body mass index is 36.44 kg/m.  Physical Exam  Constitutional: Patient appears well-developed and well-nourished.Obese  No distress.  HENT: Head: Normocephalic and atraumatic. Ears: B TMs ok, no erythema or effusion; Nose: Nose normal. Mouth/Throat: Oropharynx is clear and moist. No oropharyngeal exudate.  Eyes: Conjunctivae and EOM are normal. Pupils are equal, round, and reactive to light. No scleral icterus.  Neck: Normal range of motion. Neck  supple. No JVD present. No thyromegaly present.  Cardiovascular: Normal rate, regular rhythm and normal heart sounds.  No murmur heard. No BLE edema. Pulmonary/Chest: Effort normal and breath sounds normal. No respiratory distress. Abdominal: Soft. Bowel  sounds are normal, no distension. There is no tenderness. no masses Breast: lumpy breast,  no nipple discharge or rashes, scars from breast reduction surgery  FEMALE GENITALIA:  Not done  RECTAL: not done  Musculoskeletal: Normal range of motion, no joint effusions. No gross deformities Neurological: he is alert and oriented to person, place, and time. No cranial nerve deficit. Coordination, balance, strength, speech and gait are normal.  Skin: Skin is warm and dry. No rash noted. No erythema.  Psychiatric: Patient has a normal mood and affect. behavior is normal. Judgment and thought content normal.   Recent Results (from the past 2160 hour(s))  Urine Microalbumin w/creat. ratio     Status: None   Collection Time: 07/20/22 11:57 AM  Result Value Ref Range   Creatinine, Urine 227 20 - 275 mg/dL   Microalb, Ur 1.2 mg/dL    Comment: Reference Range Not established    Microalb Creat Ratio 5 <30 mcg/mg creat    Comment: . The ADA defines abnormalities in albumin excretion as follows: Marland Kitchen Albuminuria Category        Result (mcg/mg creatinine) . Normal to Mildly increased   <30 Moderately increased         30-299  Severely increased           > OR = 300 . The ADA recommends that at least two of three specimens collected within a 3-6 month period be abnormal before considering a patient to be within a diagnostic category.   COMPLETE METABOLIC PANEL WITH GFR     Status: Abnormal   Collection Time: 07/20/22 11:57 AM  Result Value Ref Range   Glucose, Bld 121 (H) 65 - 99 mg/dL    Comment: .            Fasting reference interval . For someone without known diabetes, a glucose value between 100 and 125 mg/dL is consistent with prediabetes and should be confirmed with a follow-up test. .    BUN 12 7 - 25 mg/dL   Creat 0.75 0.50 - 1.03 mg/dL   eGFR 95 > OR = 60 mL/min/1.54m   BUN/Creatinine Ratio SEE NOTE: 6 - 22 (calc)    Comment:    Not Reported: BUN and Creatinine are within     reference range. .    Sodium 138 135 - 146 mmol/L   Potassium 3.8 3.5 - 5.3 mmol/L   Chloride 103 98 - 110 mmol/L   CO2 25 20 - 32 mmol/L   Calcium 9.1 8.6 - 10.4 mg/dL   Total Protein 7.7 6.1 - 8.1 g/dL   Albumin 3.9 3.6 - 5.1 g/dL   Globulin 3.8 (H) 1.9 - 3.7 g/dL (calc)   AG Ratio 1.0 1.0 - 2.5 (calc)   Total Bilirubin 0.5 0.2 - 1.2 mg/dL   Alkaline phosphatase (APISO) 135 37 - 153 U/L   AST 14 10 - 35 U/L   ALT 17 6 - 29 U/L  Lipid Profile     Status: Abnormal   Collection Time: 07/20/22 11:57 AM  Result Value Ref Range   Cholesterol 92 <200 mg/dL   HDL 31 (L) > OR = 50 mg/dL   Triglycerides 76 <150 mg/dL   LDL Cholesterol (Calc) 45 mg/dL (calc)    Comment: Reference  range: <100 . Desirable range <100 mg/dL for primary prevention;   <70 mg/dL for patients with CHD or diabetic patients  with > or = 2 CHD risk factors. Marland Kitchen LDL-C is now calculated using the Martin-Hopkins  calculation, which is a validated novel method providing  better accuracy than the Friedewald equation in the  estimation of LDL-C.  Cresenciano Genre et al. Annamaria Helling. 5681;275(17): 2061-2068  (http://education.[facility].com/faq/FAQ164)    Total CHOL/HDL Ratio 3.0 <5.0 (calc)   Non-HDL Cholesterol (Calc) 61 <130 mg/dL (calc)    Comment: For patients with diabetes plus 1 major ASCVD risk  factor, treating to a non-HDL-C goal of <100 mg/dL  (LDL-C of <70 mg/dL) is considered a therapeutic  option.   POCT HgB A1C     Status: Abnormal   Collection Time: 07/20/22 12:02 PM  Result Value Ref Range   Hemoglobin A1C 7.2 (A) 4.0 - 5.6 %   HbA1c POC (<> result, manual entry)     HbA1c, POC (prediabetic range)     HbA1c, POC (controlled diabetic range)    CBC with Differential/Platelet     Status: Abnormal   Collection Time: 07/20/22 12:20 PM  Result Value Ref Range   WBC 6.8 3.8 - 10.8 Thousand/uL   RBC 4.93 3.80 - 5.10 Million/uL   Hemoglobin 13.2 11.7 - 15.5 g/dL   HCT 40.0 35.0 - 45.0 %   MCV 81.1 80.0  - 100.0 fL   MCH 26.8 (L) 27.0 - 33.0 pg   MCHC 33.0 32.0 - 36.0 g/dL   RDW 13.2 11.0 - 15.0 %   Platelets 275 140 - 400 Thousand/uL   MPV 10.8 7.5 - 12.5 fL   Neutro Abs 5,141 1,500 - 7,800 cells/uL   Lymphs Abs 1,047 850 - 3,900 cells/uL   Absolute Monocytes 456 200 - 950 cells/uL   Eosinophils Absolute 136 15 - 500 cells/uL   Basophils Absolute 20 0 - 200 cells/uL   Neutrophils Relative % 75.6 %   Total Lymphocyte 15.4 %   Monocytes Relative 6.7 %   Eosinophils Relative 2.0 %   Basophils Relative 0.3 %  TSH     Status: None   Collection Time: 07/20/22 12:20 PM  Result Value Ref Range   TSH 1.71 mIU/L    Comment:           Reference Range .           > or = 20 Years  0.40-4.50 .                Pregnancy Ranges           First trimester    0.26-2.66           Second trimester   0.55-2.73           Third trimester    0.43-2.91      Fall Risk:    08/01/2022    8:31 AM 07/20/2022   11:51 AM 04/24/2022    8:37 AM 12/20/2021    8:17 AM 12/11/2021    8:14 AM  Fall Risk   Falls in the past year? 0 0 0 0 0  Number falls in past yr: 0 0 0 0 0  Injury with Fall? 0 0 0 0 0  Risk for fall due to : No Fall Risks No Fall Risks No Fall Risks No Fall Risks   Follow up Falls prevention discussed Falls prevention discussed;Education provided;Falls evaluation completed Falls prevention discussed Falls prevention discussed  Functional Status Survey: Is the patient deaf or have difficulty hearing?: No Does the patient have difficulty seeing, even when wearing glasses/contacts?: No Does the patient have difficulty concentrating, remembering, or making decisions?: No Does the patient have difficulty walking or climbing stairs?: No Does the patient have difficulty dressing or bathing?: No Does the patient have difficulty doing errands alone such as visiting a doctor's office or shopping?: No   Assessment & Plan  1. Well adult exam   2. Breast cancer screening by  mammogram  Mammogram     -USPSTF grade A and B recommendations reviewed with patient; age-appropriate recommendations, preventive care, screening tests, etc discussed and encouraged; healthy living encouraged; see AVS for patient education given to patient -Discussed importance of 150 minutes of physical activity weekly, eat two servings of fish weekly, eat one serving of tree nuts ( cashews, pistachios, pecans, almonds.Marland Kitchen) every other day, eat 6 servings of fruit/vegetables daily and drink plenty of water and avoid sweet beverages.   -Reviewed Health Maintenance: Yes.

## 2022-08-01 ENCOUNTER — Encounter: Payer: Self-pay | Admitting: Family Medicine

## 2022-08-01 ENCOUNTER — Ambulatory Visit (INDEPENDENT_AMBULATORY_CARE_PROVIDER_SITE_OTHER): Payer: BC Managed Care – PPO | Admitting: Family Medicine

## 2022-08-01 VITALS — BP 124/76 | HR 91 | Resp 16 | Ht 65.0 in | Wt 219.0 lb

## 2022-08-01 DIAGNOSIS — Z1231 Encounter for screening mammogram for malignant neoplasm of breast: Secondary | ICD-10-CM

## 2022-08-01 DIAGNOSIS — Z0000 Encounter for general adult medical examination without abnormal findings: Secondary | ICD-10-CM | POA: Diagnosis not present

## 2022-10-30 NOTE — Progress Notes (Deleted)
Name: Sara Foley   MRN: [id number]    DOB: Jul 11, 1966   Date:10/30/2022       Progress Note  Subjective  Chief Complaint  Follow Up  HPI  HTN: bp is controlled with Losartan , she denies side effects of medication, continue current medications. No chest pain, palpitation, dizziness  or sob.   Tachycardia: heart rate remained elevated throughout her visit, denies palpitation, she had coffee before she came in, EKG sinus tachy, we will check TSH and CBC, advised to cut down on caffeine intake and we will consider beta blockers if heart rate remains elevated without a cause    Hydradenitis suppurative: she had surgery done by Dr. Celine Ahr Nov 2020.  She was seen for Dr. Vianne Bulls due to increase in pain due to a lesion on her vulva, it was drained and took antibiotics. She is seeing Dermatologist now and she was  taking Accutane but states it is costly to see him monthly due to high co-pay plus the cost of medication. Currently only taking medication prn   Diabetes type II: she had gestational diabetes , followed by many years of pre-diabetes, in July of 2021 A1C spiked to 6.6 % , last visit it was 6.8 % and today is up to 7.2 %   She has DM associated HTN, dyslipidemia, and obesity. She could not tolerate Metformin - it caused diarrhea, I gave her Rybelsus months ago but she has not been taking. Discussed with patient importance of glucose control to decrease risk of diabetic associated complications.    Vitamin D and B12 deficiency: she is taking supplements more often    Dyslipidemia: Low HDL, discussed increasing fish and tree nuts to her diet ,tolerating Atorvastatin and we will recheck labs today   Morbid obesity: she has a BMI over 35, with co-morbidities, HTN, dyslipidemia and metabolic syndrome. She states she thinks she has Rybelsus at home and will try taking it now   Post-nasal drainage: started yesterday, no fever, chills, change in appetite, but has a cough , advised rapid covid  test since we will be closed and we will not see results until Tuesday   Patient Active Problem List   Diagnosis Date Noted   Morbid obesity 12/20/2021   Dyslipidemia due to type 2 diabetes mellitus [id number]   Metabolic syndrome [id number]   Low HDL (under 40) 02/28/2016   History of hysterectomy leaving cervix intact 01/19/2016   Vitamin D deficiency 01/18/2016   Vitamin B12 deficiency 01/18/2016   History of gestational diabetes 01/17/2016   Hypertension, benign 01/17/2016   Hidradenitis suppurativa 01/17/2016   History of iron deficiency anemia 01/17/2016    Past Surgical History:  Procedure Laterality Date   BREAST BIOPSY Left 04/14/2012   left breast stereotactic biopsy; fibroadenoma   BREAST REDUCTION SURGERY  1999   HYDRADENITIS EXCISION Left 06/05/2019   Procedure: EXCISION HIDRADENITIS GROIN;  Surgeon: Fredirick Maudlin, MD;  Location: [facility] ORS;  Service: General;  Laterality: Left;   REDUCTION MAMMAPLASTY Bilateral 2000   SUPRACERVICAL ABDOMINAL HYSTERECTOMY  01/2012   due to fibroid tumors    Family History  Problem Relation Age of Onset   Prostate cancer Other    Colon cancer Maternal Uncle    Diabetes Mother    Arthritis/Rheumatoid Mother    Leukemia Father     Social History   Tobacco Use   Smoking status: Never   Smokeless tobacco: Never  Substance Use Topics   Alcohol use: Yes    Alcohol/week:  3.0 standard drinks of alcohol    Types: 3 Standard drinks or equivalent per week     Current Outpatient Medications:    atorvastatin (LIPITOR) 10 MG tablet, Take 1 tablet (10 mg total) by mouth daily., Disp: 90 tablet, Rfl: 1   Cyanocobalamin (B-12) 1000 MCG LOZG, Place 1 tablet under the tongue daily at 12 noon. DISSOLVE ONE TABLET UNDER THE TONGUE DAILY (Patient not taking: Reported on 07/20/2022), Disp: 100 lozenge, Rfl: 1   ISOtretinoin (ACCUTANE) 30 MG capsule, Take 1 capsule (30 mg total) by mouth daily. Take with fatty meal, Disp: 30 capsule, Rfl:  0   losartan (COZAAR) 100 MG tablet, Take 1 tablet (100 mg total) by mouth daily., Disp: 90 tablet, Rfl: 1   Semaglutide (RYBELSUS) 7 MG TABS, Take 7 mg by mouth daily., Disp: 90 tablet, Rfl: 0  Allergies  Allergen Reactions   Metformin Diarrhea    I personally reviewed active problem list, medication list, allergies, family history, social history, health maintenance with the patient/caregiver today.   ROS  ***  Objective  There were no vitals filed for this visit.  There is no height or weight on file to calculate BMI.  Physical Exam ***  No results found for this or any previous visit (from the past 2160 hour(s)).   PHQ2/9:    08/01/2022    8:31 AM 07/20/2022   11:51 AM 04/24/2022    8:37 AM 12/20/2021    8:17 AM 12/11/2021    8:14 AM  Depression screen PHQ 2/9  Decreased Interest 0 0 0 0 0  Down, Depressed, Hopeless 0 0 0 0 0  PHQ - 2 Score 0 0 0 0 0  Altered sleeping 0 0 0 0 0  Tired, decreased energy 0 0 0 0 0  Change in appetite 0 0 0 0 0  Feeling bad or failure about yourself  0 0 0 0 0  Trouble concentrating 0 0 0 0 0  Moving slowly or fidgety/restless 0 0 0 0 0  Suicidal thoughts 0 0 0 0 0  PHQ-9 Score 0 0 0 0 0  Difficult doing work/chores  Not difficult at all   Not difficult at all    phq 9 is {gen pos [id number]   Fall Risk:    08/01/2022    8:31 AM 07/20/2022   11:51 AM 04/24/2022    8:37 AM 12/20/2021    8:17 AM 12/11/2021    8:14 AM  Fall Risk   Falls in the past year? 0 0 0 0 0  Number falls in past yr: 0 0 0 0 0  Injury with Fall? 0 0 0 0 0  Risk for fall due to : No Fall Risks No Fall Risks No Fall Risks No Fall Risks   Follow up Falls prevention discussed Falls prevention discussed;Education provided;Falls evaluation completed Falls prevention discussed Falls prevention discussed       Functional Status Survey:      Assessment & Plan  *** There are no diagnoses linked to this encounter.

## 2022-10-31 ENCOUNTER — Ambulatory Visit: Payer: BC Managed Care – PPO | Admitting: Family Medicine

## 2023-01-23 ENCOUNTER — Other Ambulatory Visit: Payer: Self-pay | Admitting: Family Medicine

## 2023-01-23 DIAGNOSIS — E785 Hyperlipidemia, unspecified: Secondary | ICD-10-CM

## 2023-01-23 DIAGNOSIS — I10 Essential (primary) hypertension: Secondary | ICD-10-CM

## 2023-01-23 DIAGNOSIS — E786 Lipoprotein deficiency: Secondary | ICD-10-CM

## 2023-01-23 NOTE — Telephone Encounter (Signed)
Medication Refill - Medication: atorvastatin (LIPITOR) 10 MG tablet [[id number]] losartan (COZAAR) 100 MG tablet [[id number]]   Has the patient contacted their pharmacy? Yes.    (Agent: If yes, when and what did the pharmacy advise?) Contact PCP, no refills   Preferred Pharmacy (with phone number or street name): [facility] [address]   Has the patient been seen for an appointment in the last year OR does the patient have an upcoming appointment? Yes.    Agent: Please be advised that RX refills may take up to 3 business days. We ask that you follow-up with your pharmacy.

## 2023-01-24 MED ORDER — ATORVASTATIN CALCIUM 10 MG PO TABS: 10.0000 mg | ORAL_TABLET | Freq: Every day | ORAL | 0 refills | Status: DC

## 2023-01-24 MED ORDER — LOSARTAN POTASSIUM 100 MG PO TABS: 100.0000 mg | ORAL_TABLET | Freq: Every day | ORAL | 0 refills | Status: DC

## 2023-01-24 NOTE — Telephone Encounter (Signed)
Requested Prescriptions  Pending Prescriptions Disp Refills   atorvastatin (LIPITOR) 10 MG tablet 90 tablet 0    Sig: Take 1 tablet (10 mg total) by mouth daily.     Cardiovascular:  Antilipid - Statins Failed - 01/23/2023  2:11 PM      Failed - Lipid Panel in normal range within the last 12 months    Cholesterol  Date Value Ref Range Status  07/20/2022 92 <200 mg/dL Final   LDL Cholesterol (Calc)  Date Value Ref Range Status  07/20/2022 45 mg/dL (calc) Final    Comment:    Reference range: <100 . Desirable range <100 mg/dL for primary prevention;   <70 mg/dL for patients with CHD or diabetic patients  with > or = 2 CHD risk factors. Marland Kitchen LDL-C is now calculated using the Martin-Hopkins  calculation, which is a validated novel method providing  better accuracy than the Friedewald equation in the  estimation of LDL-C.  Horald Pollen et al. Lenox Ahr. 2440;102(72): 2061-2068  (http://education.[facility].com/faq/FAQ164)    HDL  Date Value Ref Range Status  07/20/2022 31 (L) > OR = 50 mg/dL Final   Triglycerides  Date Value Ref Range Status  07/20/2022 76 <150 mg/dL Final         Passed - Patient is not pregnant      Passed - Valid encounter within last 12 months    Recent Outpatient Visits           5 months ago Well adult exam   [facility] Alba Cory, MD   6 months ago Dyslipidemia due to type 2 diabetes mellitus [facility])   [place] [facility] [place], Danna Hefty, MD   9 months ago Dyslipidemia due to type 2 diabetes mellitus [facility])   [place] [facility] [place], Danna Hefty, MD   1 year ago Dyslipidemia due to type 2 diabetes mellitus [facility])   [place] [facility] Alba Cory, MD   1 year ago Abscess   [facility] Margarita Mail, DO               losartan (COZAAR) 100 MG tablet 90 tablet 0    Sig: Take 1 tablet (100 mg total) by mouth daily.      Cardiovascular:  Angiotensin Receptor Blockers Failed - 01/23/2023  2:11 PM      Failed - Cr in normal range and within 180 days    Creat  Date Value Ref Range Status  07/20/2022 0.75 0.50 - 1.03 mg/dL Final   Creatinine, Urine  Date Value Ref Range Status  07/20/2022 227 20 - 275 mg/dL Final         Failed - K in normal range and within 180 days    Potassium  Date Value Ref Range Status  07/20/2022 3.8 3.5 - 5.3 mmol/L Final         Passed - Patient is not pregnant      Passed - Last BP in normal range    BP Readings from Last 1 Encounters:  08/01/22 124/76         Passed - Valid encounter within last 6 months    Recent Outpatient Visits           5 months ago Well adult exam   [facility] Alba Cory, MD   6 months ago Dyslipidemia due to type 2 diabetes mellitus [facility])   [facility] Alba Cory, MD  9 months ago 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

## 2023-02-25 ENCOUNTER — Ambulatory Visit
Admission: RE | Admit: 2023-02-25 | Discharge: 2023-02-25 | Disposition: A | Discharge status: Home / Self Care | Payer: BC Managed Care – PPO | Source: Ambulatory Visit | Attending: Family Medicine | Admitting: Family Medicine

## 2023-02-25 DIAGNOSIS — Z1231 Encounter for screening mammogram for malignant neoplasm of breast: Secondary | ICD-10-CM | POA: Diagnosis present

## 2023-04-24 ENCOUNTER — Other Ambulatory Visit: Payer: Self-pay | Admitting: Family Medicine

## 2023-04-24 DIAGNOSIS — I10 Essential (primary) hypertension: Secondary | ICD-10-CM

## 2023-04-24 DIAGNOSIS — E786 Lipoprotein deficiency: Secondary | ICD-10-CM

## 2023-04-24 DIAGNOSIS — E1169 Type 2 diabetes mellitus with other specified complication: Secondary | ICD-10-CM

## 2023-04-30 ENCOUNTER — Other Ambulatory Visit: Payer: Self-pay | Admitting: Family Medicine

## 2023-04-30 DIAGNOSIS — E786 Lipoprotein deficiency: Secondary | ICD-10-CM

## 2023-04-30 DIAGNOSIS — E785 Hyperlipidemia, unspecified: Secondary | ICD-10-CM

## 2023-04-30 DIAGNOSIS — I10 Essential (primary) hypertension: Secondary | ICD-10-CM

## 2023-05-06 NOTE — Progress Notes (Unsigned)
Name: Sara Foley   MRN: [id number]    DOB: 04-11-1966   Date:05/07/2023       Progress Note  Subjective  Chief Complaint  Medication Refill  HPI  HTN: bp is controlled with Losartan , she has been out of medication for two weeks, so we will decrease dose to 50 mg daily and return for bp check, she denies side effects of medication, continue current medications. No chest pain, palpitation, dizziness  or sob.   Hydradenitis suppurative: she had surgery done by Dr. Lady Gary Nov 2020.  She was seen for Dr. Reola Mosher due to increase in pain due to a lesion on her vulva, it was drained and took antibiotics. She was  seeing Dermatologist now and she was  taking Accutane but stopped due to cost but is doing well.   Diabetes type II: she had gestational diabetes , followed by many years of pre-diabetes, in July of 2021 A1C spiked to 6.6 % ,today A1C is  7.1  %   She has DM associated HTN, dyslipidemia, and obesity. She could not tolerate Metformin - it caused diarrhea. I reminded her importance of being seen at least every 6 months. She needs to get an eye exam.    Vitamin D and B12 deficiency: she is taking supplements more often, we will recheck labs   Dyslipidemia: Low HDL, discussed increasing fish and tree nuts to her diet ,tolerating Atorvastatin - she ran out about 2 weeks ago.   Morbid obesity: she has a BMI over 35, with co-morbidities, HTN, dyslipidemia and metabolic syndrome.     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 2 diabetes mellitus (HCC) 06/21/2021   Metabolic syndrome 02/28/2016   Low HDL (under 40) 02/28/2016   History of hysterectomy leaving cervix intact 01/19/2016   Vitamin D deficiency 01/18/2016   Vitamin B12 deficiency 01/18/2016   History of gestational diabetes 01/17/2016   Hypertension, benign 01/17/2016   Hidradenitis suppurativa 01/17/2016   History of iron deficiency anemia 01/17/2016     Past Surgical History:  Procedure Laterality Date   BREAST BIOPSY Left 04/14/2012   left breast stereotactic biopsy; fibroadenoma   BREAST REDUCTION SURGERY  1999   HYDRADENITIS EXCISION Left 06/05/2019   Procedure: EXCISION HIDRADENITIS GROIN;  Surgeon: Duanne Guess, MD;  Location: [facility] ORS;  Service: General;  Laterality: Left;   REDUCTION MAMMAPLASTY Bilateral 2000   SUPRACERVICAL ABDOMINAL HYSTERECTOMY  01/2012   due to fibroid tumors    Family History  Problem Relation Age of Onset   Prostate cancer Other    Colon cancer Maternal Uncle    Diabetes Mother    Arthritis/Rheumatoid Mother    Leukemia Father     Social History   Tobacco Use   Smoking status: Never   Smokeless tobacco: Never  Substance Use Topics   Alcohol use: Yes    Alcohol/week: 3.0 standard drinks of alcohol    Types: 3 Standard drinks or equivalent per week     Current Outpatient Medications:    atorvastatin (LIPITOR) 10 MG tablet, Take 1 tablet (10 mg total) by mouth daily., Disp: 90 tablet, Rfl: 0   Cyanocobalamin (B-12) 1000 MCG LOZG, Place 1 tablet under the tongue daily at 12 noon. DISSOLVE ONE TABLET UNDER THE TONGUE DAILY, Disp: 100 lozenge, Rfl: 1   ISOtretinoin (ACCUTANE) 30 MG capsule, Take 1 capsule (30 mg total) by mouth daily. Take with fatty meal,  Disp: 30 capsule, Rfl: 0   losartan (COZAAR) 100 MG tablet, Take 1 tablet (100 mg total) by mouth daily., Disp: 90 tablet, Rfl: 0   Semaglutide (RYBELSUS) 7 MG TABS, Take 7 mg by mouth daily., Disp: 90 tablet, Rfl: 0  Allergies  Allergen Reactions   Metformin Diarrhea    I personally reviewed active problem list, medication list, allergies, family history, social history, health maintenance with the patient/caregiver today.   ROS  Constitutional: Negative for fever or weight change.  Respiratory: Negative for cough and shortness of breath.   Cardiovascular: Negative for chest pain or palpitations.  Gastrointestinal: Negative  for abdominal pain, no bowel changes.  Musculoskeletal: Negative for gait problem or joint swelling.  Skin: Negative for rash.  Neurological: Negative for dizziness or headache.  No other specific complaints in a complete review of systems (except as listed in HPI above).   Objective  Vitals:   05/07/23 0755  BP: 120/86  Pulse: 81  Resp: 16  SpO2: 98%  Weight: 228 lb (103.4 kg)  Height: 5\' 5"  (1.651 m)    Body mass index is 37.94 kg/m.  Physical Exam  Constitutional: Patient appears well-developed and well-nourished. Obese  No distress.  HEENT: head atraumatic, normocephalic, pupils equal and reactive to light, neck supple Cardiovascular: Normal rate, regular rhythm and normal heart sounds.  No murmur heard. No BLE edema. Pulmonary/Chest: Effort normal and breath sounds normal. No respiratory distress. Abdominal: Soft.  There is no tenderness. Psychiatric: Patient has a normal mood and affect. behavior is normal. Judgment and thought content normal.    PHQ2/9:    05/07/2023    7:56 AM 08/01/2022    8:31 AM 07/20/2022   11:51 AM 04/24/2022    8:37 AM 12/20/2021    8:17 AM  Depression screen PHQ 2/9  Decreased Interest 0 0 0 0 0  Down, Depressed, Hopeless 0 0 0 0 0  PHQ - 2 Score 0 0 0 0 0  Altered sleeping 0 0 0 0 0  Tired, decreased energy 0 0 0 0 0  Change in appetite 0 0 0 0 0  Feeling bad or failure about yourself  0 0 0 0 0  Trouble concentrating 0 0 0 0 0  Moving slowly or fidgety/restless 0 0 0 0 0  Suicidal thoughts 0 0 0 0 0  PHQ-9 Score 0 0 0 0 0  Difficult doing work/chores   Not difficult at all      phq 9 is negative   Fall Risk:    05/07/2023    7:56 AM 08/01/2022    8:31 AM 07/20/2022   11:51 AM 04/24/2022    8:37 AM 12/20/2021    8:17 AM  Fall Risk   Falls in the past year? 0 0 0 0 0  Number falls in past yr: 0 0 0 0 0  Injury with Fall? 0 0 0 0 0  Risk for fall due to : No Fall Risks No Fall Risks No Fall Risks No Fall Risks No Fall Risks   Follow up Falls prevention discussed Falls prevention discussed Falls prevention discussed;Education provided;Falls evaluation completed Falls prevention discussed Falls prevention discussed      Functional Status Survey: Is the patient deaf or have difficulty hearing?: No Does the patient have difficulty seeing, even when wearing glasses/contacts?: No Does the patient have difficulty concentrating, remembering, or making decisions?: No Does the patient have difficulty walking or climbing stairs?: No Does the patient have difficulty dressing  or bathing?: No Does the patient have difficulty doing errands alone such as visiting a doctor's office or shopping?: No    Assessment & Plan  1. Dyslipidemia due to type 2 diabetes mellitus (HCC)  - POCT HgB A1C - atorvastatin (LIPITOR) 10 MG tablet; Take 1 tablet (10 mg total) by mouth daily.  Dispense: 90 tablet; Refill: 1 - Semaglutide (RYBELSUS) 7 MG TABS; Take 1 tablet (7 mg total) by mouth daily.  Dispense: 90 tablet; Refill: 1 - Semaglutide (RYBELSUS) 3 MG TABS; Take 1 tablet (3 mg total) by mouth daily. - Lipid panel - Microalbumin / creatinine urine ratio  2. Hypertension, benign  - losartan (COZAAR) 50 MG tablet; Take 1 tablet (50 mg total) by mouth daily.  Dispense: 90 tablet; Refill: 1 - CBC with Differential/Platelet - COMPLETE METABOLIC PANEL WITH GFR  3. Low HDL (under 40)  - atorvastatin (LIPITOR) 10 MG tablet; Take 1 tablet (10 mg total) by mouth daily.  Dispense: 90 tablet; Refill: 1  4. Morbid obesity (HCC)  Discussed with the patient the risk posed by an increased BMI. Discussed importance of portion control, calorie counting and at least 150 minutes of physical activity weekly. Avoid sweet beverages and drink more water. Eat at least 6 servings of fruit and vegetables daily    5. Vitamin D deficiency  - VITAMIN D 25 Hydroxy (Vit-D Deficiency, Fractures)  6. Hidradenitis suppurativa  Doing well   7. Vitamin  B12 deficiency  - B12 and Folate Panel

## 2023-05-07 ENCOUNTER — Encounter: Payer: Self-pay | Admitting: Family Medicine

## 2023-05-07 ENCOUNTER — Ambulatory Visit: Payer: BC Managed Care – PPO | Admitting: Family Medicine

## 2023-05-07 VITALS — BP 120/86 | HR 81 | Resp 16 | Ht 65.0 in | Wt 228.0 lb

## 2023-05-07 DIAGNOSIS — E538 Deficiency of other specified B group vitamins: Secondary | ICD-10-CM

## 2023-05-07 DIAGNOSIS — E1169 Type 2 diabetes mellitus with other specified complication: Secondary | ICD-10-CM

## 2023-05-07 DIAGNOSIS — L732 Hidradenitis suppurativa: Secondary | ICD-10-CM

## 2023-05-07 DIAGNOSIS — I10 Essential (primary) hypertension: Secondary | ICD-10-CM

## 2023-05-07 DIAGNOSIS — E785 Hyperlipidemia, unspecified: Secondary | ICD-10-CM | POA: Diagnosis not present

## 2023-05-07 DIAGNOSIS — E786 Lipoprotein deficiency: Secondary | ICD-10-CM

## 2023-05-07 DIAGNOSIS — E559 Vitamin D deficiency, unspecified: Secondary | ICD-10-CM

## 2023-05-07 LAB — POCT GLYCOSYLATED HEMOGLOBIN (HGB A1C): Hemoglobin A1C: 7.1 % — AB (ref 4.0–5.6)

## 2023-05-07 MED ORDER — RYBELSUS 3 MG PO TABS
3.0000 mg | ORAL_TABLET | Freq: Every day | ORAL | Status: DC
Start: 2023-05-07 — End: 2023-10-11

## 2023-05-07 MED ORDER — ATORVASTATIN CALCIUM 10 MG PO TABS
10.0000 mg | ORAL_TABLET | Freq: Every day | ORAL | 1 refills | Status: DC
Start: 2023-05-07 — End: 2023-10-11

## 2023-05-07 MED ORDER — RYBELSUS 7 MG PO TABS
7.0000 mg | ORAL_TABLET | Freq: Every day | ORAL | 1 refills | Status: DC
Start: 2023-05-07 — End: 2023-10-11

## 2023-05-07 MED ORDER — LOSARTAN POTASSIUM 50 MG PO TABS
50.0000 mg | ORAL_TABLET | Freq: Every day | ORAL | 1 refills | Status: DC
Start: 2023-05-07 — End: 2023-10-11

## 2023-06-05 ENCOUNTER — Other Ambulatory Visit: Payer: Self-pay | Admitting: Family Medicine

## 2023-06-05 ENCOUNTER — Encounter: Payer: Self-pay | Admitting: Family Medicine

## 2023-06-05 DIAGNOSIS — R1013 Epigastric pain: Secondary | ICD-10-CM

## 2023-06-07 ENCOUNTER — Ambulatory Visit: Payer: BC Managed Care – PPO

## 2023-06-07 VITALS — BP 122/70

## 2023-06-07 DIAGNOSIS — Z0130 Encounter for examination of blood pressure without abnormal findings: Secondary | ICD-10-CM

## 2023-06-12 LAB — COMPLETE METABOLIC PANEL WITH GFR
AG Ratio: 1.1 (calc) (ref 1.0–2.5)
ALT: 17 U/L (ref 6–29)
AST: 16 U/L (ref 10–35)
Albumin: 3.9 g/dL (ref 3.6–5.1)
Alkaline phosphatase (APISO): 126 U/L (ref 37–153)
BUN: 17 mg/dL (ref 7–25)
CO2: 26 mmol/L (ref 20–32)
Calcium: 9.1 mg/dL (ref 8.6–10.4)
Chloride: 105 mmol/L (ref 98–110)
Creat: 0.64 mg/dL (ref 0.50–1.03)
Globulin: 3.7 g/dL (ref 1.9–3.7)
Glucose, Bld: 102 mg/dL — ABNORMAL HIGH (ref 65–99)
Potassium: 4.6 mmol/L (ref 3.5–5.3)
Sodium: 140 mmol/L (ref 135–146)
Total Bilirubin: 0.4 mg/dL (ref 0.2–1.2)
Total Protein: 7.6 g/dL (ref 6.1–8.1)
eGFR: 104 mL/min/{1.73_m2} (ref >=60)

## 2023-06-12 LAB — LIPID PANEL
Cholesterol: 101 mg/dL (ref <200)
HDL: 39 mg/dL — ABNORMAL LOW (ref >=50)
LDL Cholesterol (Calc): 48 mg/dL
Non-HDL Cholesterol (Calc): 62 mg/dL (ref <130)
Total CHOL/HDL Ratio: 2.6 (calc) (ref <5.0)
Triglycerides: 64 mg/dL (ref <150)

## 2023-06-12 LAB — CBC WITH DIFFERENTIAL/PLATELET
Absolute Lymphocytes: 1139 {cells}/uL (ref 850–3900)
Absolute Monocytes: 372 {cells}/uL (ref 200–950)
Basophils Absolute: 12 {cells}/uL (ref 0–200)
Basophils Relative: 0.2 %
Eosinophils Absolute: 130 {cells}/uL (ref 15–500)
Eosinophils Relative: 2.2 %
HCT: 43.7 % (ref 35.0–45.0)
Hemoglobin: 13.8 g/dL (ref 11.7–15.5)
MCH: 26.3 pg — ABNORMAL LOW (ref 27.0–33.0)
MCHC: 31.6 g/dL — ABNORMAL LOW (ref 32.0–36.0)
MCV: 83.2 fL (ref 80.0–100.0)
MPV: 10.7 fL (ref 7.5–12.5)
Monocytes Relative: 6.3 %
Neutro Abs: 4248 {cells}/uL (ref 1500–7800)
Neutrophils Relative %: 72 %
Platelets: 305 10*3/uL (ref 140–400)
RBC: 5.25 10*6/uL — ABNORMAL HIGH (ref 3.80–5.10)
RDW: 13.1 % (ref 11.0–15.0)
Total Lymphocyte: 19.3 %
WBC: 5.9 10*3/uL (ref 3.8–10.8)

## 2023-06-12 LAB — B12 AND FOLATE PANEL
Folate: 6.8 ng/mL
Vitamin B-12: 368 pg/mL (ref 200–1100)

## 2023-06-12 LAB — MICROALBUMIN / CREATININE URINE RATIO
Creatinine, Urine: 143 mg/dL (ref 20–275)
Microalb Creat Ratio: 5 mg/g{creat} (ref <30)
Microalb, Ur: 0.7 mg/dL

## 2023-06-12 LAB — H. PYLORI BREATH TEST: H. pylori Breath Test: DETECTED — AB

## 2023-06-12 LAB — VITAMIN D 25 HYDROXY (VIT D DEFICIENCY, FRACTURES): Vit D, 25-Hydroxy: 19 ng/mL — ABNORMAL LOW (ref 30–100)

## 2023-06-13 ENCOUNTER — Encounter: Payer: Self-pay | Admitting: Family Medicine

## 2023-06-13 ENCOUNTER — Other Ambulatory Visit: Payer: Self-pay | Admitting: Family Medicine

## 2023-06-13 DIAGNOSIS — A048 Other specified bacterial intestinal infections: Secondary | ICD-10-CM

## 2023-06-13 MED ORDER — METRONIDAZOLE 500 MG PO TABS
500.0000 mg | ORAL_TABLET | Freq: Two times a day (BID) | ORAL | 0 refills | Status: DC
Start: 2023-06-13 — End: 2023-10-11

## 2023-06-13 MED ORDER — CLARITHROMYCIN 500 MG PO TABS
500.0000 mg | ORAL_TABLET | Freq: Two times a day (BID) | ORAL | 0 refills | Status: DC
Start: 2023-06-13 — End: 2023-10-11

## 2023-06-13 MED ORDER — AMOXICILLIN 500 MG PO TABS
1000.0000 mg | ORAL_TABLET | Freq: Two times a day (BID) | ORAL | 0 refills | Status: DC
Start: 2023-06-13 — End: 2023-10-11

## 2023-06-13 MED ORDER — OMEPRAZOLE 20 MG PO CPDR
20.0000 mg | DELAYED_RELEASE_CAPSULE | Freq: Two times a day (BID) | ORAL | 0 refills | Status: DC
Start: 2023-06-13 — End: 2023-10-11

## 2023-10-11 ENCOUNTER — Ambulatory Visit: Payer: BC Managed Care – PPO | Admitting: Family Medicine

## 2023-10-11 ENCOUNTER — Encounter: Payer: Self-pay | Admitting: Family Medicine

## 2023-10-11 VITALS — BP 136/86 | HR 95 | Temp 98.3°F | Resp 16 | Ht 65.0 in | Wt 222.8 lb

## 2023-10-11 DIAGNOSIS — E559 Vitamin D deficiency, unspecified: Secondary | ICD-10-CM

## 2023-10-11 DIAGNOSIS — E785 Hyperlipidemia, unspecified: Secondary | ICD-10-CM

## 2023-10-11 DIAGNOSIS — E1169 Type 2 diabetes mellitus with other specified complication: Secondary | ICD-10-CM | POA: Diagnosis not present

## 2023-10-11 DIAGNOSIS — L732 Hidradenitis suppurativa: Secondary | ICD-10-CM

## 2023-10-11 DIAGNOSIS — E538 Deficiency of other specified B group vitamins: Secondary | ICD-10-CM

## 2023-10-11 DIAGNOSIS — I10 Essential (primary) hypertension: Secondary | ICD-10-CM

## 2023-10-11 DIAGNOSIS — A048 Other specified bacterial intestinal infections: Secondary | ICD-10-CM

## 2023-10-11 DIAGNOSIS — E786 Lipoprotein deficiency: Secondary | ICD-10-CM

## 2023-10-11 LAB — POCT GLYCOSYLATED HEMOGLOBIN (HGB A1C): Hemoglobin A1C: 6.9 % — AB (ref 4.0–5.6)

## 2023-10-11 MED ORDER — ATORVASTATIN CALCIUM 10 MG PO TABS
10.0000 mg | ORAL_TABLET | Freq: Every day | ORAL | 1 refills | Status: AC
Start: 2023-10-11 — End: ?

## 2023-10-11 MED ORDER — AMOXICILLIN 500 MG PO TABS: 1000.0000 mg | ORAL_TABLET | Freq: Two times a day (BID) | ORAL | 0 refills | Status: DC

## 2023-10-11 MED ORDER — LOSARTAN POTASSIUM 50 MG PO TABS: 50.0000 mg | ORAL_TABLET | Freq: Every day | ORAL | 1 refills | Status: DC

## 2023-10-11 MED ORDER — OMEPRAZOLE 20 MG PO CPDR
20.0000 mg | DELAYED_RELEASE_CAPSULE | Freq: Two times a day (BID) | ORAL | 0 refills | Status: AC
Start: 2023-10-11 — End: ?

## 2023-10-11 MED ORDER — METRONIDAZOLE 500 MG PO TABS
500.0000 mg | ORAL_TABLET | Freq: Two times a day (BID) | ORAL | 0 refills | Status: AC
Start: 2023-10-11 — End: ?

## 2023-10-11 MED ORDER — CLARITHROMYCIN 500 MG PO TABS
500.0000 mg | ORAL_TABLET | Freq: Two times a day (BID) | ORAL | 0 refills | Status: AC
Start: 2023-10-11 — End: ?

## 2023-10-11 NOTE — Patient Instructions (Signed)
Helicobacter Pylori Infection Helicobacter pylori infection is a bacterial infection in the stomach. Long-term (chronic) infection can cause stomach irritation (gastritis), ulcers in the stomach (gastric ulcers), and ulcers in the upper part of the intestine (duodenal ulcers). Having this infection may also increase your risk of stomach cancer and a type of white blood cell cancer (lymphoma) that affects the stomach. What are the causes? This infection is caused by the Helicobacter pylori (H. pylori) bacteria. Many healthy people have this bacteria in their stomach lining. The bacteria may also spread from person to person through contact with stool (feces) or saliva. It is not known why some people develop ulcers, gastritis, or cancer from the bacteria. What increases the risk? You are more likely to develop this condition if you: Have family members with the infection. Live with many other people, such as in a dormitory. What are the signs or symptoms? Most people with this infection do not have any symptoms. If you do have symptoms, they may include: Heartburn. Stomach pain. Nausea. Vomiting. The vomit may be bloody because of ulcers. Loss of appetite. Bad breath. How is this diagnosed? This condition may be diagnosed based on: Your symptoms and medical history. A physical exam. Blood tests. Stool tests. A breath test. A procedure that involves placing a tube with a camera on the end of it down your throat to examine your stomach and upper intestine (upper endoscopy). Removing and testing a tissue sample from the stomach lining (biopsy). A biopsy may be taken during an upper endoscopy. How is this treated?  This condition is treated by taking a combination of medicines (triple therapy) for several weeks. Triple therapy includes one medicine to reduce the amount of acid in your stomach and two types of antibiotic medicines. This treatment may reduce your risk of cancer. You may need to  be tested for H. pylori again after treatment. In some cases, the treatment may need to be repeated if your treatment did not get rid of all the bacteria. Follow these instructions at home:  Take over-the-counter and prescription medicines only as told by your health care provider. Take your antibiotic medicine as told by your health care provider. Do not stop taking the antibiotics even if you start to feel better. Return to your normal activities as told by your health care provider. Ask your health care provider what activities are safe for you. Take steps to prevent future infections: Wash your hands often with soap and water for at least 20 seconds. If soap and water are not available, use hand sanitizer. Do not eat food or drink water that may have had contact with stool or saliva. Keep all follow-up visits. This is important. You may need tests to make sure your treatment worked. Contact a health care provider if your symptoms: Do not get better with treatment. Return after treatment. Summary Helicobacter pylori infection is a stomach infection caused by the Helicobacter pylori (H. pylori) bacteria. This infection can cause stomach irritation (gastritis), ulcers in the stomach (gastric ulcers), and ulcers in the upper part of the intestine (duodenal ulcers). This condition is treated by taking a combination of medicines (triple therapy) for several weeks. Take your antibiotic medicine as told by your health care provider. Do not stop taking the antibiotics even if you start to feel better. This information is not intended to replace advice given to you by your health care provider. Make sure you discuss any questions you have with your health care provider. Document Revised:   02/01/2021 Document Reviewed: 02/01/2021 Elsevier Patient Education  2024 [employer]  

## 2023-10-11 NOTE — Progress Notes (Signed)
 Name: Sara Foley   MRN: [id number]    DOB: 12-18-1965   Date:10/11/2023       Progress Note  Subjective  Chief Complaint  Chief Complaint  Patient presents with   Medical Management of Chronic Issues   HPI   HTN: bp is high today, she has been out of medication again .    Hydradenitis suppurative: she had surgery done by Dr. Lady Gary Nov 2020.  She was seen for Dr. Reola Mosher due to increase in pain due to a lesion on her vulva, it was drained and took antibiotics. She was  seeing Dermatologist now and she was  taking Accutane but stopped due to cost but no recent flares   H. Pylori: tested positive in Nov when she was having indigestion, I sent rx for amoxicillin, metronidazole and clarithromycin  and Omeprazole but she never picked it up. She states she has been feeling well, explained she should be treated    Diabetes type II: she had gestational diabetes , followed by many years of pre-diabetes, in July of 2021 A1C spiked to 6.6 % it was  A1C is  7.1  % last visit we tried Rybelsus but she only took one month and has been off medication, A1C is 6.8 % today and she prefers not taking medications for diabetes at this time.  She has DM associated HTN, dyslipidemia, and obesity. She could not tolerate Metformin - it caused diarrhea. She has been out of ARB and statin but usually takes it    Vitamin D and B12 deficiency: she is taking supplements more often   Dyslipidemia: Low HDL, discussed increasing fish and tree nuts to her diet ,tolerating Atorvastatin - she ran out about 2 weeks ago but will resume it today .    Morbid obesity: she has a BMI over 35, with co-morbidities, HTN, dyslipidemia and metabolic syndrome. Discussed life style modification   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 2 diabetes mellitus (HCC) 06/21/2021   Metabolic syndrome 02/28/2016   Low HDL (under 40) 02/28/2016   History of  hysterectomy leaving cervix intact 01/19/2016   Vitamin D deficiency 01/18/2016   Vitamin B12 deficiency 01/18/2016   History of gestational diabetes 01/17/2016   Hypertension, benign 01/17/2016   Hidradenitis suppurativa 01/17/2016   History of iron deficiency anemia 01/17/2016    Past Surgical History:  Procedure Laterality Date   BREAST BIOPSY Left 04/14/2012   left breast stereotactic biopsy; fibroadenoma   BREAST REDUCTION SURGERY  1999   HYDRADENITIS EXCISION Left 06/05/2019   Procedure: EXCISION HIDRADENITIS GROIN;  Surgeon: Duanne Guess, MD;  Location: [facility] ORS;  Service: General;  Laterality: Left;   REDUCTION MAMMAPLASTY Bilateral 2000   SUPRACERVICAL ABDOMINAL HYSTERECTOMY  01/2012   due to fibroid tumors    Family History  Problem Relation Age of Onset   Prostate cancer Other    Colon cancer Maternal Uncle    Diabetes Mother    Arthritis/Rheumatoid Mother    Leukemia Father     Social History   Tobacco Use   Smoking status: Never   Smokeless tobacco: Never  Substance Use Topics   Alcohol use: Yes    Alcohol/week: 3.0 standard drinks of alcohol    Types: 3 Standard drinks or equivalent per week     Current Outpatient Medications:    amoxicillin (AMOXIL) 500 MG tablet, Take 2 tablets (1,000 mg total) by mouth 2 (  two) times daily., Disp: 56 tablet, Rfl: 0   atorvastatin (LIPITOR) 10 MG tablet, Take 1 tablet (10 mg total) by mouth daily., Disp: 90 tablet, Rfl: 1   clarithromycin (BIAXIN) 500 MG tablet, Take 1 tablet (500 mg total) by mouth 2 (two) times daily., Disp: 28 tablet, Rfl: 0   Cyanocobalamin (B-12) 1000 MCG LOZG, Place 1 tablet under the tongue daily at 12 noon. DISSOLVE ONE TABLET UNDER THE TONGUE DAILY (Patient not taking: Reported on 10/11/2023), Disp: 100 lozenge, Rfl: 1   losartan (COZAAR) 50 MG tablet, Take 1 tablet (50 mg total) by mouth daily., Disp: 90 tablet, Rfl: 1   metroNIDAZOLE (FLAGYL) 500 MG tablet, Take 1 tablet (500 mg total) by  mouth 2 (two) times daily., Disp: 28 tablet, Rfl: 0   omeprazole (PRILOSEC) 20 MG capsule, Take 1 capsule (20 mg total) by mouth 2 (two) times daily before a meal., Disp: 28 capsule, Rfl: 0  Allergies  Allergen Reactions   Metformin Diarrhea    I personally reviewed active problem list, medication list, allergies, family history with the patient/caregiver today.   ROS  Ten systems reviewed and is negative except as mentioned in HPI    Objective  Vitals:   10/11/23 0818  BP: (!) 146/88  Pulse: 95  Resp: 16  Temp: 98.3 F (36.8 C)  TempSrc: Oral  SpO2: 97%  Weight: 222 lb 12.8 oz (101.1 kg)  Height: 5\' 5"  (1.651 m)    Body mass index is 37.08 kg/m.  Physical Exam  Constitutional: Patient appears well-developed and well-nourished. Obese  No distress.  HEENT: head atraumatic, normocephalic, pupils equal and reactive to light, neck supple Cardiovascular: Normal rate, regular rhythm and normal heart sounds.  No murmur heard. No BLE edema. Pulmonary/Chest: Effort normal and breath sounds normal. No respiratory distress. Abdominal: Soft.  There is no tenderness. Psychiatric: Patient has a normal mood and affect. behavior is normal. Judgment and thought content normal.   Recent Results (from the past 2160 hours)  POCT glycosylated hemoglobin (Hb A1C)     Status: Abnormal   Collection Time: 10/11/23  8:23 AM  Result Value Ref Range   Hemoglobin A1C 6.9 (A) 4.0 - 5.6 %   HbA1c POC (<> result, manual entry)     HbA1c, POC (prediabetic range)     HbA1c, POC (controlled diabetic range)      Diabetic Foot Exam:  Diabetic foot exam was performed with the following findings:   Normal sensation of 10g monofilament Intact posterior tibialis and dorsalis pedis pulses Thick toenails and corn formation       PHQ2/9:    10/11/2023    8:18 AM 05/07/2023    7:56 AM 08/01/2022    8:31 AM 07/20/2022   11:51 AM 04/24/2022    8:37 AM  Depression screen PHQ 2/9  Decreased Interest  0 0 0 0 0  Down, Depressed, Hopeless 0 0 0 0 0  PHQ - 2 Score 0 0 0 0 0  Altered sleeping 0 0 0 0 0  Tired, decreased energy 0 0 0 0 0  Change in appetite 0 0 0 0 0  Feeling bad or failure about yourself  0 0 0 0 0  Trouble concentrating 0 0 0 0 0  Moving slowly or fidgety/restless 0 0 0 0 0  Suicidal thoughts 0 0 0 0 0  PHQ-9 Score 0 0 0 0 0  Difficult doing work/chores Not difficult at all   Not difficult at all  phq 9 is negative  Fall Risk:    05/07/2023    7:56 AM 08/01/2022    8:31 AM 07/20/2022   11:51 AM 04/24/2022    8:37 AM 12/20/2021    8:17 AM  Fall Risk   Falls in the past year? 0 0 0 0 0  Number falls in past yr: 0 0 0 0 0  Injury with Fall? 0 0 0 0 0  Risk for fall due to : No Fall Risks No Fall Risks No Fall Risks No Fall Risks No Fall Risks  Follow up Falls prevention discussed Falls prevention discussed Falls prevention discussed;Education provided;Falls evaluation completed Falls prevention discussed Falls prevention discussed     Assessment & Plan  1. Dyslipidemia due to type 2 diabetes mellitus (HCC) (Primary)  - POCT glycosylated hemoglobin (Hb A1C) - HM Diabetes Foot Exam - atorvastatin (LIPITOR) 10 MG tablet; Take 1 tablet (10 mg total) by mouth daily.  Dispense: 90 tablet; Refill: 1  2. Morbid obesity (HCC)  Discussed with the patient the risk posed by an increased BMI. Discussed importance of portion control, calorie counting and at least 150 minutes of physical activity weekly. Avoid sweet beverages and drink more water. Eat at least 6 servings of fruit and vegetables daily    3. Hidradenitis suppurativa  Stopped Accutane but doing well at this time  4. Vitamin D deficiency  Continue vitamin D supplementation  5. Low HDL (under 40)  - atorvastatin (LIPITOR) 10 MG tablet; Take 1 tablet (10 mg total) by mouth daily.  Dispense: 90 tablet; Refill: 1  6. Hypertension, benign  - losartan (COZAAR) 50 MG tablet; Take 1 tablet (50 mg total) by  mouth daily.  Dispense: 90 tablet; Refill: 1  7. Vitamin B12 deficiency  Continue supplements  8. H. pylori infection  - metroNIDAZOLE (FLAGYL) 500 MG tablet; Take 1 tablet (500 mg total) by mouth 2 (two) times daily.  Dispense: 28 tablet; Refill: 0 - omeprazole (PRILOSEC) 20 MG capsule; Take 1 capsule (20 mg total) by mouth 2 (two) times daily before a meal.  Dispense: 28 capsule; Refill: 0 - clarithromycin (BIAXIN) 500 MG tablet; Take 1 tablet (500 mg total) by mouth 2 (two) times daily.  Dispense: 28 tablet; Refill: 0 - amoxicillin (AMOXIL) 500 MG tablet; Take 2 tablets (1,000 mg total) by mouth 2 (two) times daily.  Dispense: 56 tablet; Refill: 0

## 2023-10-30 LAB — HM DIABETES EYE EXAM

## 2023-12-23 ENCOUNTER — Encounter: Payer: Self-pay | Admitting: Family Medicine

## 2023-12-26 ENCOUNTER — Other Ambulatory Visit: Payer: Self-pay | Admitting: Family Medicine

## 2023-12-26 MED ORDER — BEET ROOT 500 MG PO CAPS: 1.0000 | ORAL_CAPSULE | Freq: Every day | ORAL | 3 refills | Status: AC

## 2024-01-13 ENCOUNTER — Ambulatory Visit: Admitting: Family Medicine

## 2024-01-13 ENCOUNTER — Encounter: Payer: Self-pay | Admitting: Family Medicine

## 2024-01-13 VITALS — BP 134/80 | HR 99 | Resp 16 | Ht 65.0 in | Wt 223.5 lb

## 2024-01-13 DIAGNOSIS — E538 Deficiency of other specified B group vitamins: Secondary | ICD-10-CM

## 2024-01-13 DIAGNOSIS — I152 Hypertension secondary to endocrine disorders: Secondary | ICD-10-CM

## 2024-01-13 DIAGNOSIS — E1159 Type 2 diabetes mellitus with other circulatory complications: Secondary | ICD-10-CM

## 2024-01-13 DIAGNOSIS — E559 Vitamin D deficiency, unspecified: Secondary | ICD-10-CM | POA: Diagnosis not present

## 2024-01-13 DIAGNOSIS — E785 Hyperlipidemia, unspecified: Secondary | ICD-10-CM

## 2024-01-13 DIAGNOSIS — E1169 Type 2 diabetes mellitus with other specified complication: Secondary | ICD-10-CM

## 2024-01-13 DIAGNOSIS — Z8619 Personal history of other infectious and parasitic diseases: Secondary | ICD-10-CM | POA: Diagnosis not present

## 2024-01-13 DIAGNOSIS — Z7984 Long term (current) use of oral hypoglycemic drugs: Secondary | ICD-10-CM

## 2024-01-13 DIAGNOSIS — E669 Obesity, unspecified: Secondary | ICD-10-CM

## 2024-01-13 LAB — POCT GLYCOSYLATED HEMOGLOBIN (HGB A1C): Hemoglobin A1C: 7.8 % — AB (ref 4.0–5.6)

## 2024-01-13 MED ORDER — FLUCONAZOLE 150 MG PO TABS: 150.0000 mg | ORAL_TABLET | ORAL | 0 refills | Status: DC

## 2024-01-13 MED ORDER — EMPAGLIFLOZIN 25 MG PO TABS: 25.0000 mg | ORAL_TABLET | Freq: Every day | ORAL | 0 refills | Status: DC

## 2024-01-13 MED ORDER — VITAMIN D 50 MCG (2000 UT) PO CAPS: 1.0000 | ORAL_CAPSULE | Freq: Every day | ORAL | 1 refills | Status: AC

## 2024-01-13 MED ORDER — LOSARTAN POTASSIUM 50 MG PO TABS: 50.0000 mg | ORAL_TABLET | Freq: Every day | ORAL | 1 refills | Status: DC

## 2024-01-13 MED ORDER — B-12 1000 MCG PO LOZG: 1.0000 | LOZENGE | Freq: Every day | ORAL | 1 refills | Status: AC

## 2024-01-13 MED ORDER — ATORVASTATIN CALCIUM 10 MG PO TABS: 10.0000 mg | ORAL_TABLET | Freq: Every day | ORAL | 1 refills | Status: DC

## 2024-01-13 NOTE — Progress Notes (Signed)
 Name: Sara Foley   MRN: [id number]    DOB: 04-Jan-1966   Date:01/13/2024       Progress Note  Subjective  Chief Complaint  Chief Complaint  Patient presents with   Medical Management of Chronic Issues   Discussed the use of AI scribe software for clinical note transcription with the patient, who gave verbal consent to proceed.  History of Present Illness Sara Foley is a 58 year old female with hypertension and diabetes who presents for follow-up of her blood pressure and diabetes management.  Her blood pressure has improved since March, with a current reading of 134/80 compared to 146/88 previously. She has not been taking any antihypertensive medication, focusing instead on lifestyle changes. She is somewhat attentive to her diet but acknowledges it is still not optimal. She does not monitor her blood pressure at home due to issues with her machine.  Her A1c has increased from 6.9% in March to 7.8% currently, indicating worsening glycemic control. Her A1c has been elevated since 2022, with values of 6.5% in 2022, 6.6%, 6.7%, 7.2%, 7.1%, 6.9%, and 7.8% in subsequent visits. She attributes this to snacking and consuming [place] wine, which she describes as very sweet.  She is not taking B12 supplements despite a history of deficiency.  She has a history of H. pylori infection, for which she was treated with antibiotics including , biaxin , amoxicillin  and metronidazole . She does not recall if she completed the course of antibiotics. She reports no current symptoms of heartburn or indigestion and feels well overall.  She takes atorvastatin  and losartan  regularly. No chest pain, palpitations, muscle aches, excessive hunger, thirst, or frequent urination. She drinks water, has reduced soda intake, and uses a flavored creamer in her coffee without adding sugar.    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 2 diabetes  mellitus (HCC) 06/21/2021   Metabolic syndrome 02/28/2016   Low HDL (under 40) 02/28/2016   History of hysterectomy leaving cervix intact 01/19/2016   Vitamin D  deficiency 01/18/2016   Vitamin B12 deficiency 01/18/2016   History of gestational diabetes 01/17/2016   Hypertension, benign 01/17/2016   Hidradenitis suppurativa 01/17/2016   History of iron deficiency anemia 01/17/2016    Past Surgical History:  Procedure Laterality Date   BREAST BIOPSY Left 04/14/2012   left breast stereotactic biopsy; fibroadenoma   BREAST REDUCTION SURGERY  1999   HYDRADENITIS EXCISION Left 06/05/2019   Procedure: EXCISION HIDRADENITIS GROIN;  Surgeon: Mercy Stall, MD;  Location: [facility] ORS;  Service: General;  Laterality: Left;   REDUCTION MAMMAPLASTY Bilateral 2000   SUPRACERVICAL ABDOMINAL HYSTERECTOMY  01/2012   due to fibroid tumors    Family History  Problem Relation Age of Onset   Prostate cancer Other    Colon cancer Maternal Uncle    Diabetes Mother    Arthritis/Rheumatoid Mother    Leukemia Father     Social History   Tobacco Use   Smoking status: Never   Smokeless tobacco: Never  Substance Use Topics   Alcohol use: Yes    Alcohol/week: 3.0 standard drinks of alcohol    Types: 3 Standard drinks or equivalent per week     Current Outpatient Medications:    atorvastatin  (LIPITOR) 10 MG tablet, Take 1 tablet (10 mg total) by mouth daily., Disp: 90 tablet, Rfl: 1   losartan  (COZAAR ) 50 MG tablet, Take 1 tablet (50 mg total) by mouth daily., Disp:  90 tablet, Rfl: 1   Misc Natural Products (BEET ROOT) 500 MG CAPS, Take 1 each by mouth daily after breakfast., Disp: 100 capsule, Rfl: 3   amoxicillin  (AMOXIL ) 500 MG tablet, Take 2 tablets (1,000 mg total) by mouth 2 (two) times daily. (Patient not taking: Reported on 01/13/2024), Disp: 56 tablet, Rfl: 0   clarithromycin  (BIAXIN ) 500 MG tablet, Take 1 tablet (500 mg total) by mouth 2 (two) times daily. (Patient not taking: Reported on  01/13/2024), Disp: 28 tablet, Rfl: 0   Cyanocobalamin  (B-12) 1000 MCG LOZG, Place 1 tablet under the tongue daily at 12 noon. DISSOLVE ONE TABLET UNDER THE TONGUE DAILY (Patient not taking: Reported on 01/13/2024), Disp: 100 lozenge, Rfl: 1   metroNIDAZOLE  (FLAGYL ) 500 MG tablet, Take 1 tablet (500 mg total) by mouth 2 (two) times daily. (Patient not taking: Reported on 01/13/2024), Disp: 28 tablet, Rfl: 0   omeprazole  (PRILOSEC) 20 MG capsule, Take 1 capsule (20 mg total) by mouth 2 (two) times daily before a meal. (Patient not taking: Reported on 01/13/2024), Disp: 28 capsule, Rfl: 0  Allergies  Allergen Reactions   Metformin  Diarrhea    I personally reviewed active problem list, medication list, allergies, family history with the patient/caregiver today.   ROS  Ten systems reviewed and is negative except as mentioned in HPI    Objective Physical Exam Constitutional: Patient appears well-developed and well-nourished. Obese  No distress.  HEENT: head atraumatic, normocephalic, pupils equal and reactive to light, neck supple Cardiovascular: Normal rate, regular rhythm and normal heart sounds.  No murmur heard. No BLE edema. Pulmonary/Chest: Effort normal and breath sounds normal. No respiratory distress. Abdominal: Soft.  There is no tenderness. Psychiatric: Patient has a normal mood and affect. behavior is normal. Judgment and thought content normal.     Vitals:   01/13/24 0940  BP: 134/80  Pulse: 99  Resp: 16  SpO2: 97%  Weight: 223 lb 8 oz (101.4 kg)  Height: 5' 5 (1.651 m)    Body mass index is 37.19 kg/m.  Recent Results (from the past 2160 hours)  HM DIABETES EYE EXAM     Status: None   Collection Time: 10/30/23  3:34 PM  Result Value Ref Range   HM Diabetic Eye Exam No Retinopathy No Retinopathy    Comment: ABS BY HIM  POCT glycosylated hemoglobin (Hb A1C)     Status: Abnormal   Collection Time: 01/13/24  9:51 AM  Result Value Ref Range   Hemoglobin A1C 7.8 (A)  4.0 - 5.6 %   HbA1c POC (<> result, manual entry)     HbA1c, POC (prediabetic range)     HbA1c, POC (controlled diabetic range)      Diabetic Foot Exam:     PHQ2/9:    01/13/2024    9:35 AM 10/11/2023    8:18 AM 05/07/2023    7:56 AM 08/01/2022    8:31 AM 07/20/2022   11:51 AM  Depression screen PHQ 2/9  Decreased Interest 0 0 0 0 0  Down, Depressed, Hopeless 0 0 0 0 0  PHQ - 2 Score 0 0 0 0 0  Altered sleeping  0 0 0 0  Tired, decreased energy  0 0 0 0  Change in appetite  0 0 0 0  Feeling bad or failure about yourself   0 0 0 0  Trouble concentrating  0 0 0 0  Moving slowly or fidgety/restless  0 0 0 0  Suicidal thoughts  0 0  0 0  PHQ-9 Score  0 0 0 0  Difficult doing work/chores  Not difficult at all   Not difficult at all    phq 9 is negative  Fall Risk:    01/13/2024    9:34 AM 05/07/2023    7:56 AM 08/01/2022    8:31 AM 07/20/2022   11:51 AM 04/24/2022    8:37 AM  Fall Risk   Falls in the past year? 0 0 0 0 0  Number falls in past yr: 0 0 0 0 0  Injury with Fall? 0 0 0 0 0  Risk for fall due to : No Fall Risks No Fall Risks No Fall Risks No Fall Risks No Fall Risks  Follow up Falls prevention discussed;Education provided;Falls evaluation completed Falls prevention discussed Falls prevention discussed  Falls prevention discussed;Education provided;Falls evaluation completed  Falls prevention discussed      Data saved with a previous flowsheet row definition     Assessment & Plan Type 2 diabetes mellitus with associated HTN, obesity and dyslipidemia  Diabetes is uncontrolled with A1c at 7.8%, worsened by diet, inactivity, and possible insulin  resistance. Sweet wine affects glucose levels. Jardiance preferred for its benefits and low side effects. - Initiate Jardiance 25 mg once daily. - Encourage dietary changes to reduce sugar intake, including avoiding sweet wines. - Increase physical activity, especially post-meal walks.  Essential hypertension Blood pressure  improved to 134/80 mmHg but not at target. Maintaining levels in the 120s is crucial. - Continue losartan . - Encourage bringing home blood pressure machine to next visit for calibration.  Vitamin B12 deficiency Low B12 levels may cause fatigue. - Initiate sublingual B12 supplementation. - Send B12 prescription to pharmacy.  Helicobacter pylori infection Previously treated, currently asymptomatic. Future testing if symptoms recur.  General Health Maintenance Routine health maintenance including Pap smear is important. - Schedule Pap smear.

## 2024-02-26 ENCOUNTER — Encounter: Payer: Self-pay | Admitting: Family Medicine

## 2024-02-26 LAB — HEMOGLOBIN A1C: Hemoglobin A1C: 6.6

## 2024-03-12 ENCOUNTER — Ambulatory Visit (INDEPENDENT_AMBULATORY_CARE_PROVIDER_SITE_OTHER): Admitting: Family Medicine

## 2024-03-12 ENCOUNTER — Other Ambulatory Visit (HOSPITAL_COMMUNITY)
Admission: RE | Admit: 2024-03-12 | Discharge: 2024-03-12 | Disposition: A | Discharge status: Home / Self Care | Source: Ambulatory Visit | Attending: Family Medicine | Admitting: Family Medicine

## 2024-03-12 ENCOUNTER — Encounter: Payer: Self-pay | Admitting: Family Medicine

## 2024-03-12 VITALS — BP 128/76 | HR 97 | Resp 16 | Ht 62.5 in | Wt 220.3 lb

## 2024-03-12 DIAGNOSIS — Z13820 Encounter for screening for osteoporosis: Secondary | ICD-10-CM | POA: Diagnosis not present

## 2024-03-12 DIAGNOSIS — Z1231 Encounter for screening mammogram for malignant neoplasm of breast: Secondary | ICD-10-CM

## 2024-03-12 DIAGNOSIS — Z0000 Encounter for general adult medical examination without abnormal findings: Secondary | ICD-10-CM | POA: Diagnosis not present

## 2024-03-12 DIAGNOSIS — Z124 Encounter for screening for malignant neoplasm of cervix: Secondary | ICD-10-CM | POA: Diagnosis present

## 2024-03-12 NOTE — Progress Notes (Signed)
 Name: Sara Foley   MRN: [id number]    DOB: 1965-09-15   Date:03/12/2024       Progress Note  Subjective  Chief Complaint  Chief Complaint  Patient presents with   Annual Exam    HPI  Patient presents for annual CPE.  Diet: eating healthier, drinking more water, better breakfast ( had avocado toast this morning) eating tree nuts for snacks. Cooking at home and eating smaller portions and balanced meals Exercise: she has been walking and is going to resume her stationary bike   Last Eye Exam: completed Last Dental Exam: completed  Flowsheet Row Office Visit from 03/12/2024 in [facility]  AUDIT-C Score 2   Depression: Phq 9 is  negative    03/12/2024   10:46 AM 01/13/2024    9:35 AM 10/11/2023    8:18 AM 05/07/2023    7:56 AM 08/01/2022    8:31 AM  Depression screen PHQ 2/9  Decreased Interest 0 0 0 0 0  Down, Depressed, Hopeless 0 0 0 0 0  PHQ - 2 Score 0 0 0 0 0  Altered sleeping   0 0 0  Tired, decreased energy   0 0 0  Change in appetite   0 0 0  Feeling bad or failure about yourself    0 0 0  Trouble concentrating   0 0 0  Moving slowly or fidgety/restless   0 0 0  Suicidal thoughts   0 0 0  PHQ-9 Score   0 0 0  Difficult doing work/chores   Not difficult at all     Hypertension: BP Readings from Last 3 Encounters:  03/12/24 128/76  01/13/24 134/80  10/11/23 136/86   Obesity: Wt Readings from Last 3 Encounters:  03/12/24 220 lb 4.8 oz (99.9 kg)  01/13/24 223 lb 8 oz (101.4 kg)  10/11/23 222 lb 12.8 oz (101.1 kg)   BMI Readings from Last 3 Encounters:  03/12/24 39.65 kg/m  01/13/24 37.19 kg/m  10/11/23 37.08 kg/m     Vaccines: reviewed with the patient.   Hep C Screening: completed STD testing and prevention (HIV/chl/gon/syphilis): N/A Intimate partner violence: negative screen  Sexual History : not sexually active for years  Menstrual History/LMP/Abnormal Bleeding: s/p hysterectomy  Discussed importance of follow up if  any post-menopausal bleeding: yes  Incontinence Symptoms: negative for symptoms   Breast cancer:  - Last Mammogram: needs to schedule  - BRCA gene screening: N/A  Osteoporosis Prevention : Discussed high calcium and vitamin D supplementation, weight bearing exercises Bone density :discussed with patient to check with insurance    Cervical cancer screening: performing today  Skin cancer: Discussed monitoring for atypical lesions  Colorectal cancer: repeat in 2026    Lung cancer:  Low Dose CT Chest recommended if Age 51-80 years, 20 pack-year currently smoking OR have quit w/in 15years. Patient does not qualify for screen   ECG: 2023  Advanced Care Planning: A voluntary discussion about advance care planning including the explanation and discussion of advance directives.  Discussed health care proxy and Living will, and the patient was able to identify a health care proxy as Izell Daring.  Patient does not have a living will and power of attorney of health care   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 2 diabetes mellitus (HCC) 06/21/2021   Metabolic syndrome 02/28/2016   Low HDL (under 40) 02/28/2016  History of hysterectomy leaving cervix intact 01/19/2016   Vitamin D  deficiency 01/18/2016   Vitamin B12 deficiency 01/18/2016   History of gestational diabetes 01/17/2016   Hypertension, benign 01/17/2016   Hidradenitis suppurativa 01/17/2016   History of iron deficiency anemia 01/17/2016    Past Surgical History:  Procedure Laterality Date   BREAST BIOPSY Left 04/14/2012   left breast stereotactic biopsy; fibroadenoma   BREAST REDUCTION SURGERY  1999   HYDRADENITIS EXCISION Left 06/05/2019   Procedure: EXCISION HIDRADENITIS GROIN;  Surgeon: Marolyn Nest, MD;  Location: [facility] ORS;  Service: General;  Laterality: Left;   REDUCTION MAMMAPLASTY Bilateral 2000   SUPRACERVICAL ABDOMINAL HYSTERECTOMY  01/2012    due to fibroid tumors    Family History  Problem Relation Age of Onset   Prostate cancer Other    Colon cancer Maternal Uncle    Diabetes Mother    Arthritis/Rheumatoid Mother    Leukemia Father     Social History   Socioeconomic History   Marital status: Divorced    Spouse name: Not on file   Number of children: 3   Years of education: Not on file   Highest education level: Not on file  Occupational History   Not on file  Tobacco Use   Smoking status: Never   Smokeless tobacco: Never  Vaping Use   Vaping status: Never Used  Substance and Sexual Activity   Alcohol use: Yes    Alcohol/week: 3.0 standard drinks of alcohol    Types: 3 Standard drinks or equivalent per week   Drug use: No   Sexual activity: Not Currently  Other Topics Concern   Not on file  Social History Narrative   Not on file   Social Drivers of Health   Financial Resource Strain: Low Risk  (03/12/2024)   Overall Financial Resource Strain (CARDIA)    Difficulty of Paying Living Expenses: Not hard at all  Food Insecurity: No Food Insecurity (03/12/2024)   Hunger Vital Sign    Worried About Running Out of Food in the Last Year: Never true    Ran Out of Food in the Last Year: Never true  Transportation Needs: No Transportation Needs (03/12/2024)   PRAPARE - Administrator, Civil Service (Medical): No    Lack of Transportation (Non-Medical): No  Physical Activity: Insufficiently Active (03/12/2024)   Exercise Vital Sign    Days of Exercise per Week: 2 days    Minutes of Exercise per Session: 30 min  Stress: No Stress Concern Present (03/12/2024)   [employer] of Occupational Health - Occupational Stress Questionnaire    Feeling of Stress: Only a little  Social Connections: Moderately Integrated (03/12/2024)   Social Connection and Isolation Panel    Frequency of Communication with Friends and Family: More than three times a week    Frequency of Social Gatherings with Friends and  Family: Twice a week    Attends Religious Services: More than 4 times per year    Active Member of [employer] or Organizations: Yes    Attends Engineer, structural: More than 4 times per year    Marital Status: Divorced  Intimate Partner Violence: Not At Risk (03/12/2024)   Humiliation, Afraid, Rape, and Kick questionnaire    Fear of Current or Ex-Partner: No    Emotionally Abused: No    Physically Abused: No    Sexually Abused: No     Current Outpatient Medications:    atorvastatin  (LIPITOR) 10 MG tablet, Take 1 tablet (  10 mg total) by mouth daily., Disp: 90 tablet, Rfl: 1   Cyanocobalamin (B-12) 1000 MCG LOZG, Place 1 tablet under the tongue daily at 12 noon. DISSOLVE ONE TABLET UNDER THE TONGUE DAILY, Disp: 100 lozenge, Rfl: 1   empagliflozin (JARDIANCE) 25 MG TABS tablet, Take 1 tablet (25 mg total) by mouth daily before breakfast., Disp: 90 tablet, Rfl: 0   Cholecalciferol (VITAMIN D) 50 MCG (2000 UT) CAPS, Take 1 capsule (2,000 Units total) by mouth daily after breakfast. (Patient not taking: Reported on 03/12/2024), Disp: 100 capsule, Rfl: 1   fluconazole (DIFLUCAN) 150 MG tablet, Take 1 tablet (150 mg total) by mouth every other day. (Patient not taking: Reported on 03/12/2024), Disp: 1 tablet, Rfl: 0   losartan (COZAAR) 50 MG tablet, Take 1 tablet (50 mg total) by mouth daily., Disp: 90 tablet, Rfl: 1   Misc Natural Products (BEET ROOT) 500 MG CAPS, Take 1 each by mouth daily after breakfast., Disp: 100 capsule, Rfl: 3  Allergies  Allergen Reactions   Metformin Diarrhea     ROS  Constitutional: Negative for fever or weight change.  Respiratory: Negative for cough and shortness of breath.   Cardiovascular: Negative for chest pain or palpitations.  Gastrointestinal: Negative for abdominal pain, no bowel changes.  Musculoskeletal: Negative for gait problem or joint swelling.  Skin: Negative for rash.  Neurological: Negative for dizziness or headache.  No other specific  complaints in a complete review of systems (except as listed in HPI above).   Objective  Vitals:   03/12/24 1048  BP: 128/76  Pulse: 97  Resp: 16  SpO2: 99%  Weight: 220 lb 4.8 oz (99.9 kg)  Height: 5' 2.5 (1.588 m)    Body mass index is 39.65 kg/m.  Physical Exam  Constitutional: Patient appears well-developed and well-nourished. No distress.  HENT: Head: Normocephalic and atraumatic. Ears: B TMs ok, no erythema or effusion; Nose: Nose normal. Mouth/Throat: Oropharynx is clear and moist. No oropharyngeal exudate.  Eyes: Conjunctivae and EOM are normal. Pupils are equal, round, and reactive to light. No scleral icterus.  Neck: Normal range of motion. Neck supple. No JVD present. No thyromegaly present.  Cardiovascular: Normal rate, regular rhythm and normal heart sounds.  No murmur heard. No BLE edema. Pulmonary/Chest: Effort normal and breath sounds normal. No respiratory distress. Abdominal: Soft. Bowel sounds are normal, no distension. There is no tenderness. no masses Breast: no lumps or masses, no nipple discharge , small pustula on right outer upper breast - states noticed it yesterday  FEMALE GENITALIA:  External genitalia has scaring from HS External urethra normal Vaginal vault normal without discharge or lesions Cervix normal without discharge or lesions Bimanual exam normal without masses RECTAL: not done  Musculoskeletal: Normal range of motion, no joint effusions. No gross deformities Neurological: he is alert and oriented to person, place, and time. No cranial nerve deficit. Coordination, balance, strength, speech and gait are normal.  Skin: Skin is warm and dry. HS scars on axilla and vulva   Psychiatric: Patient has a normal mood and affect. behavior is normal. Judgment and thought content normal.     Assessment & Plan  1. Well adult exam (Primary)  - Cytology - PAP - MM 3D SCREENING MAMMOGRAM BILATERAL BREAST; Future - DG Bone Density; Future  2.  Screening for malignant neoplasm of cervix  - Cytology - PAP  3. Encounter for screening mammogram for malignant neoplasm of breast  - MM 3D SCREENING MAMMOGRAM BILATERAL BREAST; Future  4.  Osteoporosis screening  - DG Bone Density; Future   She will monitor pustule on breast and contact us  if needed.   -USPSTF grade A and B recommendations reviewed with patient; age-appropriate recommendations, preventive care, screening tests, etc discussed and encouraged; healthy living encouraged; see AVS for patient education given to patient -Discussed importance of 150 minutes of physical activity weekly, eat two servings of fish weekly, eat one serving of tree nuts ( cashews, pistachios, pecans, almonds.[name]) every other day, eat 6 servings of fruit/vegetables daily and drink plenty of water and avoid sweet beverages.   -Reviewed Health Maintenance: Yes.

## 2024-03-12 NOTE — Patient Instructions (Signed)
 Preventive Care 58-56 Years Old, Female  Preventive care refers to lifestyle choices and visits with your health care provider that can promote health and wellness. Preventive care visits are also called wellness exams.  What can I expect for my preventive care visit?  Counseling  Your health care provider may ask you questions about your:  Medical history, including:  Past medical problems.  Family medical history.  Pregnancy history.  Current health, including:  Menstrual cycle.  Method of birth control.  Emotional well-being.  Home life and relationship well-being.  Sexual activity and sexual health.  Lifestyle, including:  Alcohol, nicotine or tobacco, and drug use.  Access to firearms.  Diet, exercise, and sleep habits.  Work and work Astronomer.  Sunscreen use.  Safety issues such as seatbelt and bike helmet use.  Physical exam  Your health care provider will check your:  Height and weight. These may be used to calculate your BMI (body mass index). BMI is a measurement that tells if you are at a healthy weight.  Waist circumference. This measures the distance around your waistline. This measurement also tells if you are at a healthy weight and may help predict your risk of certain diseases, such as type 2 diabetes and high blood pressure.  Heart rate and blood pressure.  Body temperature.  Skin for abnormal spots.  What immunizations do I need?    Vaccines are usually given at various ages, according to a schedule. Your health care provider will recommend vaccines for you based on your age, medical history, and lifestyle or other factors, such as travel or where you work.  What tests do I need?  Screening  Your health care provider may recommend screening tests for certain conditions. This may include:  Lipid and cholesterol levels.  Diabetes screening. This is done by checking your blood sugar (glucose) after you have not eaten for a while (fasting).  Pelvic exam and Pap test.  Hepatitis B test.  Hepatitis C  test.  HIV (human immunodeficiency virus) test.  STI (sexually transmitted infection) testing, if you are at risk.  Lung cancer screening.  Colorectal cancer screening.  Mammogram. Talk with your health care provider about when you should start having regular mammograms. This may depend on whether you have a family history of breast cancer.  BRCA-related cancer screening. This may be done if you have a family history of breast, ovarian, tubal, or peritoneal cancers.  Bone density scan. This is done to screen for osteoporosis.  Talk with your health care provider about your test results, treatment options, and if necessary, the need for more tests.  Follow these instructions at home:  Eating and drinking    Eat a diet that includes fresh fruits and vegetables, whole grains, lean protein, and low-fat dairy products.  Take vitamin and mineral supplements as recommended by your health care provider.  Do not drink alcohol if:  Your health care provider tells you not to drink.  You are pregnant, may be pregnant, or are planning to become pregnant.  If you drink alcohol:  Limit how much you have to 0-1 drink a day.  Know how much alcohol is in your drink. In the [place] one drink equals one 12 oz bottle of beer (355 mL), one 5 oz glass of wine (148 mL), or one 1 oz glass of hard liquor (44 mL).  Lifestyle  Brush your teeth every morning and night with fluoride toothpaste. Floss one time each day.  Exercise for at least  30 minutes 5 or more days each week.  Do not use any products that contain nicotine or tobacco. These products include cigarettes, chewing tobacco, and vaping devices, such as e-cigarettes. If you need help quitting, ask your health care provider.  Do not use drugs.  If you are sexually active, practice safe sex. Use a condom or other form of protection to prevent STIs.  If you do not wish to become pregnant, use a form of birth control. If you plan to become pregnant, see your health care provider for a  prepregnancy visit.  Take aspirin only as told by your health care provider. Make sure that you understand how much to take and what form to take. Work with your health care provider to find out whether it is safe and beneficial for you to take aspirin daily.  Find healthy ways to manage stress, such as:  Meditation, yoga, or listening to music.  Journaling.  Talking to a trusted person.  Spending time with friends and family.  Minimize exposure to UV radiation to reduce your risk of skin cancer.  Safety  Always wear your seat belt while driving or riding in a vehicle.  Do not drive:  If you have been drinking alcohol. Do not ride with someone who has been drinking.  When you are tired or distracted.  While texting.  If you have been using any mind-altering substances or drugs.  Wear a helmet and other protective equipment during sports activities.  If you have firearms in your house, make sure you follow all gun safety procedures.  Seek help if you have been physically or sexually abused.  What's next?  Visit your health care provider once a year for an annual wellness visit.  Ask your health care provider how often you should have your eyes and teeth checked.  Stay up to date on all vaccines.  This information is not intended to replace advice given to you by your health care provider. Make sure you discuss any questions you have with your health care provider.  Document Revised: 01/11/2021 Document Reviewed: 01/11/2021  Elsevier Patient Education  2024 [employer].

## 2024-03-17 LAB — CYTOLOGY - PAP
Comment: NEGATIVE
Diagnosis: UNDETERMINED — AB
High risk HPV: NEGATIVE

## 2024-03-18 ENCOUNTER — Ambulatory Visit: Payer: Self-pay | Admitting: Family Medicine

## 2024-03-19 ENCOUNTER — Encounter: Payer: Self-pay | Admitting: Family Medicine

## 2024-03-21 ENCOUNTER — Ambulatory Visit: Admission: EM | Admit: 2024-03-21 | Discharge: 2024-03-21 | Disposition: A | Discharge status: Home / Self Care

## 2024-03-21 ENCOUNTER — Other Ambulatory Visit: Payer: Self-pay

## 2024-03-21 ENCOUNTER — Emergency Department
Admission: EM | Admit: 2024-03-21 | Discharge: 2024-03-21 | Disposition: A | Discharge status: Home / Self Care | Attending: Emergency Medicine | Admitting: Emergency Medicine

## 2024-03-21 ENCOUNTER — Encounter: Payer: Self-pay | Admitting: Emergency Medicine

## 2024-03-21 ENCOUNTER — Emergency Department

## 2024-03-21 DIAGNOSIS — N764 Abscess of vulva: Secondary | ICD-10-CM | POA: Insufficient documentation

## 2024-03-21 DIAGNOSIS — N762 Acute vulvitis: Secondary | ICD-10-CM | POA: Insufficient documentation

## 2024-03-21 DIAGNOSIS — I10 Essential (primary) hypertension: Secondary | ICD-10-CM | POA: Insufficient documentation

## 2024-03-21 DIAGNOSIS — R000 Tachycardia, unspecified: Secondary | ICD-10-CM

## 2024-03-21 LAB — CBC WITH DIFFERENTIAL/PLATELET
Abs Immature Granulocytes: 0.04 K/uL (ref 0.00–0.07)
Basophils Absolute: 0 K/uL (ref 0.0–0.1)
Basophils Relative: 0 %
Eosinophils Absolute: 0.1 K/uL (ref 0.0–0.5)
Eosinophils Relative: 1 %
HCT: 44.4 % (ref 36.0–46.0)
Hemoglobin: 13.7 g/dL (ref 12.0–15.0)
Immature Granulocytes: 0 %
Lymphocytes Relative: 9 %
Lymphs Abs: 1 K/uL (ref 0.7–4.0)
MCH: 26.3 pg (ref 26.0–34.0)
MCHC: 30.9 g/dL (ref 30.0–36.0)
MCV: 85.2 fL (ref 80.0–100.0)
Monocytes Absolute: 0.5 K/uL (ref 0.1–1.0)
Monocytes Relative: 5 %
Neutro Abs: 9.5 K/uL — ABNORMAL HIGH (ref 1.7–7.7)
Neutrophils Relative %: 85 %
Platelets: 282 K/uL (ref 150–400)
RBC: 5.21 MIL/uL — ABNORMAL HIGH (ref 3.87–5.11)
RDW: 14.5 % (ref 11.5–15.5)
WBC: 11.1 K/uL — ABNORMAL HIGH (ref 4.0–10.5)
nRBC: 0 % (ref 0.0–0.2)

## 2024-03-21 LAB — COMPREHENSIVE METABOLIC PANEL WITH GFR
ALT: 16 U/L (ref 0–44)
AST: 21 U/L (ref 15–41)
Albumin: 3.6 g/dL (ref 3.5–5.0)
Alkaline Phosphatase: 104 U/L (ref 38–126)
Anion gap: 10 (ref 5–15)
BUN: 11 mg/dL (ref 6–20)
CO2: 23 mmol/L (ref 22–32)
Calcium: 9.1 mg/dL (ref 8.9–10.3)
Chloride: 107 mmol/L (ref 98–111)
Creatinine, Ser: 0.57 mg/dL (ref 0.44–1.00)
GFR, Estimated: >60 mL/min (ref >60)
Glucose, Bld: 192 mg/dL — ABNORMAL HIGH (ref 70–99)
Potassium: 3.3 mmol/L — ABNORMAL LOW (ref 3.5–5.1)
Sodium: 140 mmol/L (ref 135–145)
Total Bilirubin: 0.5 mg/dL (ref 0.0–1.2)
Total Protein: 8.2 g/dL — ABNORMAL HIGH (ref 6.5–8.1)

## 2024-03-21 LAB — LACTIC ACID, PLASMA: Lactic Acid, Venous: 1.3 mmol/L (ref 0.5–1.9)

## 2024-03-21 MED ORDER — SODIUM CHLORIDE 0.9 % IV SOLN
1.0000 g | Freq: Once | INTRAVENOUS | Status: AC
  Administered 2024-03-21: 1 g via INTRAVENOUS
  Filled 2024-03-21: qty 10

## 2024-03-21 MED ORDER — MORPHINE SULFATE (PF) 4 MG/ML IV SOLN
4.0000 mg | Freq: Once | INTRAVENOUS | Status: AC
  Administered 2024-03-21: 4 mg via INTRAVENOUS
  Filled 2024-03-21: qty 1

## 2024-03-21 MED ORDER — LIDOCAINE HCL (PF) 1 % IJ SOLN
5.0000 mL | Freq: Once | INTRAMUSCULAR | Status: AC
  Administered 2024-03-21: 5 mL
  Filled 2024-03-21: qty 5

## 2024-03-21 MED ORDER — SULFAMETHOXAZOLE-TRIMETHOPRIM 800-160 MG PO TABS
1.0000 | ORAL_TABLET | Freq: Once | ORAL | Status: AC
  Administered 2024-03-21: 1 via ORAL
  Filled 2024-03-21: qty 1

## 2024-03-21 MED ORDER — HYDROMORPHONE HCL 1 MG/ML IJ SOLN
1.0000 mg | Freq: Once | INTRAMUSCULAR | Status: AC
  Administered 2024-03-21: 1 mg via INTRAVENOUS
  Filled 2024-03-21: qty 1

## 2024-03-21 MED ORDER — BENZOCAINE 20 % MT SOLN
Freq: Once | OROMUCOSAL | Status: AC
  Administered 2024-03-21: 1 via OROMUCOSAL
  Filled 2024-03-21: qty 1

## 2024-03-21 MED ORDER — BUPIVACAINE HCL (PF) 0.5 % IJ SOLN
10.0000 mL | Freq: Once | INTRAMUSCULAR | Status: AC
  Administered 2024-03-21: 10 mL
  Filled 2024-03-21: qty 10

## 2024-03-21 MED ORDER — OXYCODONE-ACETAMINOPHEN 5-325 MG PO TABS
1.0000 | ORAL_TABLET | Freq: Three times a day (TID) | ORAL | 0 refills | Status: AC | PRN
Start: 2024-03-21 — End: 2024-03-24

## 2024-03-21 MED ORDER — ONDANSETRON HCL 4 MG/2ML IJ SOLN
4.0000 mg | Freq: Once | INTRAMUSCULAR | Status: AC
  Administered 2024-03-21: 4 mg via INTRAVENOUS
  Filled 2024-03-21: qty 2

## 2024-03-21 MED ORDER — SODIUM CHLORIDE 0.9 % IV BOLUS
1000.0000 mL | Freq: Once | INTRAVENOUS | Status: AC
  Administered 2024-03-21: 1000 mL via INTRAVENOUS

## 2024-03-21 MED ORDER — IOHEXOL 300 MG/ML  SOLN
100.0000 mL | Freq: Once | INTRAMUSCULAR | Status: AC | PRN
  Administered 2024-03-21: 100 mL via INTRAVENOUS

## 2024-03-21 MED ORDER — LORAZEPAM 1 MG PO TABS
1.0000 mg | ORAL_TABLET | Freq: Once | ORAL | Status: AC
  Administered 2024-03-21: 1 mg via ORAL
  Filled 2024-03-21: qty 1

## 2024-03-21 MED ORDER — SULFAMETHOXAZOLE-TRIMETHOPRIM 800-160 MG PO TABS: 1.0000 | ORAL_TABLET | Freq: Two times a day (BID) | ORAL | 0 refills | Status: DC

## 2024-03-21 NOTE — ED Notes (Signed)
 Patient denies pain and is resting comfortably.

## 2024-03-21 NOTE — Discharge Instructions (Signed)
 You have been treated for a vulvar abscess and cellulitis.  The abscess has been drained and a ribbon packing has been placed.  Remove the ribbon packing in 3 days.  Apply warm compress to help promote healing.  Take the prescription antibiotic twice daily as directed.  Pain medicine as needed.  Follow-up with your PCP for wound check and packing removal as discussed.  Return to the ED if necessary.

## 2024-03-21 NOTE — ED Provider Notes (Signed)
 [facility] Provider Note    Event Date/Time   First MD Initiated Contact with Patient 03/21/24 1343     (approximate)   History   Abscess   HPI  Miosotis SHARNI NEGRON is a 58 y.o. female history of duodenitis, gestational diabetes, hypertension presents emergency department with an abscess on the left labia.  Patient states been there for at least 3 to 4 days.  Had been putting a warm compress on the area.  States went to urgent care and they told her it was so bad that might need surgery.  Sent her here to the ED.  She denies having a fever.      Physical Exam   Triage Vital Signs: ED Triage Vitals  Encounter Vitals Group     BP 03/21/24 1259 (!) 143/82     Girls Systolic BP Percentile --      Girls Diastolic BP Percentile --      Boys Systolic BP Percentile --      Boys Diastolic BP Percentile --      Pulse Rate 03/21/24 1259 (!) 109     Resp 03/21/24 1259 18     Temp 03/21/24 1259 99 F (37.2 C)     Temp Source 03/21/24 1259 Oral     SpO2 03/21/24 1259 94 %     Weight 03/21/24 1300 192 lb (87.1 kg)     Height 03/21/24 1300 5' 5 (1.651 m)     Head Circumference --      Peak Flow --      Pain Score 03/21/24 1302 7     Pain Loc --      Pain Education --      Exclude from Growth Chart --     Most recent vital signs: Vitals:   03/21/24 1259 03/21/24 1430  BP: (!) 143/82 103/68  Pulse: (!) 109 (!) 114  Resp: 18 17  Temp: 99 F (37.2 C)   SpO2: 94% 96%     General: Awake, no distress.   CV:  Good peripheral perfusion.  Resp:  Normal effort Abd:  No distention.   Other:  Extremely large left labial abscess extending into the pubis, area is very tender to palpation, no active draining noted   ED Results / Procedures / Treatments   Labs (all labs ordered are listed, but only abnormal results are displayed) Labs Reviewed  CBC WITH DIFFERENTIAL/PLATELET - Abnormal; Notable for the following components:      Result Value   WBC 11.1  (*)    RBC 5.21 (*)    Neutro Abs 9.5 (*)    All other components within normal limits  LACTIC ACID, PLASMA  COMPREHENSIVE METABOLIC PANEL WITH GFR     EKG     RADIOLOGY CT abdomen pelvis IV contrast    PROCEDURES:   Procedures  Critical Care:   Chief Complaint  Patient presents with   Abscess      MEDICATIONS ORDERED IN ED: Medications  ondansetron  (ZOFRAN ) injection 4 mg (4 mg Intravenous Given 03/21/24 1424)  morphine  (PF) 4 MG/ML injection 4 mg (4 mg Intravenous Given 03/21/24 1424)  cefTRIAXone  (ROCEPHIN ) 1 g in sodium chloride  0.9 % 100 mL IVPB (1 g Intravenous New Bag/Given 03/21/24 1425)  sodium chloride  0.9 % bolus 1,000 mL (1,000 mLs Intravenous New Bag/Given 03/21/24 1425)     IMPRESSION / MDM / ASSESSMENT AND PLAN / ED COURSE  I reviewed the triage vital signs and the nursing  notes.                              Differential diagnosis includes, but is not limited to, Fournier's gangrene, Bartholin cyst, pelvic abscess, sepsis, cellulitis  Patient's presentation is most consistent with acute illness / injury with system symptoms.    Medications given: Morphine  4 mg IV, Zofran  4 mg IV, Rocephin  1 g IV normal saline 1 L IV  Patient is a little tachycardic.  Concerns for sepsis criteria at this time.  Will start undifferentiated sepsis criteria.  Labs ordered, fluids ordered, antibiotics started, CT ordered   Care transferred to [facility], PA-C at shift change   FINAL CLINICAL IMPRESSION(S) / ED DIAGNOSES   Final diagnoses:  Abscess     Rx / DC Orders   ED Discharge Orders     None        Note:  This document was prepared using Dragon voice recognition software and may include unintentional dictation errors.    Gasper Devere ORN, PA-C 03/21/24 1506    Dorothyann Drivers, MD 03/21/24 [phone]

## 2024-03-21 NOTE — ED Provider Notes (Signed)
 ----------------------------------------- 8:23 PM on 03/21/2024 -----------------------------------------  Blood pressure 133/83, pulse (!) 110, temperature 99 F (37.2 C), temperature source Oral, resp. rate 18, height 5' 5 (1.651 m), weight 87.1 kg, SpO2 94%.  Assuming care from Dr. ***, PA-C/NP-C.  In short, Sara Foley is a 58 y.o. female with a chief complaint of Abscess .  Refer to the original H&P for additional details.  The current plan of care is to ***.  ____________________________________________    ED Results / Procedures / Treatments   Labs (all labs ordered are listed, but only abnormal results are displayed) Labs Reviewed  COMPREHENSIVE METABOLIC PANEL WITH GFR - Abnormal; Notable for the following components:      Result Value   Potassium 3.3 (*)    Glucose, Bld 192 (*)    Total Protein 8.2 (*)    All other components within normal limits  CBC WITH DIFFERENTIAL/PLATELET - Abnormal; Notable for the following components:   WBC 11.1 (*)    RBC 5.21 (*)    Neutro Abs 9.5 (*)    All other components within normal limits  LACTIC ACID, PLASMA     EKG  ***   RADIOLOGY  {**I personally viewed and evaluated these images as part of my medical decision making, as well as reviewing the written report by the radiologist.  ED Provider Interpretation: ***}  CT ABDOMEN PELVIS W CONTRAST Result Date: 03/21/2024 CLINICAL DATA:  Pelvic abscess.  Suspicion for Fournier gangrene. EXAM: CT ABDOMEN AND PELVIS WITH CONTRAST TECHNIQUE: Multidetector CT imaging of the abdomen and pelvis was performed using the standard protocol following bolus administration of intravenous contrast. RADIATION DOSE REDUCTION: This exam was performed according to the departmental dose-optimization program which includes automated exposure control, adjustment of the mA and/or kV according to patient size and/or use of iterative reconstruction technique. CONTRAST:  OMNIPAQUE  IOHEXOL  300  MG/ML  SOLN COMPARISON:  None Available. FINDINGS: Lower Chest: No acute findings. Hepatobiliary: No suspicious hepatic masses identified. Moderate diffuse hepatic steatosis. Gallbladder is unremarkable. No evidence of biliary ductal dilatation. Pancreas:  No mass or inflammatory changes. Spleen: Within normal limits in size and appearance. Adrenals/Urinary Tract: No suspicious masses identified. No evidence of ureteral calculi or hydronephrosis. Unremarkable unopacified urinary bladder. Stomach/Bowel: No evidence of obstruction, inflammatory process or abnormal fluid collections. Normal appendix visualized. Diverticulosis is seen mainly involving the descending and sigmoid colon, however there is no evidence of diverticulitis. Vascular/Lymphatic: No pathologically enlarged lymph nodes. No acute vascular findings. Reproductive: Prior supracervical hysterectomy noted. No pelvic mass or free fluid identified. Soft tissue stranding is seen within the left labia, consistent with cellulitis. A complex rim enhancing fluid collection is seen in the inferior left labia which measures 4.6 x 3.2 cm, consistent with abscess. No soft tissue gas is seen. Other:  None. Musculoskeletal:  No suspicious bone lesions identified. IMPRESSION: Left labial cellulitis, with 4.6 cm abscess. No evidence of soft tissue gas. Colonic diverticulosis, without radiographic evidence of diverticulitis. Moderate hepatic steatosis. Electronically Signed   By: Norleen DELENA Kil M.D.   On: 03/21/2024 17:49     PROCEDURES:  Critical Care performed: {CriticalCareYesNo:19197::Yes, see critical care procedure note(s),No}  Procedures   MEDICATIONS ORDERED IN ED: Medications  ondansetron  (ZOFRAN ) injection 4 mg (4 mg Intravenous Given 03/21/24 1424)  morphine  (PF) 4 MG/ML injection 4 mg (4 mg Intravenous Given 03/21/24 1424)  cefTRIAXone  (ROCEPHIN ) 1 g in sodium chloride  0.9 % 100 mL IVPB (0 g Intravenous Stopped 03/21/24 1525)  sodium chloride   0.9 % bolus 1,000 mL (0 mLs Intravenous Stopped 03/21/24 1537)  iohexol  (OMNIPAQUE ) 300 MG/ML solution 100 mL (100 mLs Intravenous Contrast Given 03/21/24 1530)  benzocaine  (HURRICAINE) 20 % mouth spray (1 Application Mouth/Throat Given 03/21/24 1928)  lidocaine  (PF) (XYLOCAINE ) 1 % injection 5 mL (5 mLs Infiltration Given 03/21/24 1928)  bupivacaine (PF) (MARCAINE ) 0.5 % injection 10 mL (10 mLs Infiltration Given 03/21/24 1928)  LORazepam  (ATIVAN ) tablet 1 mg (1 mg Oral Given 03/21/24 1854)  sulfamethoxazole -trimethoprim  (BACTRIM  DS) 800-160 MG per tablet 1 tablet (1 tablet Oral Given 03/21/24 1854)  HYDROmorphone  (DILAUDID ) injection 1 mg (1 mg Intravenous Given 03/21/24 1924)     IMPRESSION / MDM / ASSESSMENT AND PLAN / ED COURSE  I reviewed the triage vital signs and the nursing notes.                              Differential diagnosis includes, but is not limited to, ***  Patient's presentation is most consistent with acute complicated illness / injury requiring diagnostic workup.   Patient's diagnosis is consistent with ***. Patient will be discharged home with prescriptions for ***. Patient is to follow up with *** as needed or otherwise directed. Patient is given ED precautions to return to the ED for any worsening or new symptoms.     FINAL CLINICAL IMPRESSION(S) / ED DIAGNOSES   Final diagnoses:  Vulvar abscess  Vulvar cellulitis     Rx / DC Orders   ED Discharge Orders          Ordered    sulfamethoxazole -trimethoprim  (BACTRIM  DS) 800-160 MG tablet  2 times daily        03/21/24 2024    oxyCODONE -acetaminophen  (PERCOCET) 5-325 MG tablet  Every 8 hours PRN        03/21/24 2024             Note:  This document was prepared using Dragon voice recognition software and may include unintentional dictation errors.

## 2024-03-21 NOTE — ED Triage Notes (Signed)
 Patient c/o an abscess on her vagina x 3 days.  Patient has been applying hot compresses and tub baths.  The area is very tender to touch, Tylenol  for pain.

## 2024-03-21 NOTE — Discharge Instructions (Signed)
 Go to the emergency department for evaluation of the very large labial abscess.

## 2024-03-21 NOTE — ED Triage Notes (Signed)
 Pt to ED from UC for abscess to labia x 3 days. Denies fever.

## 2024-03-21 NOTE — ED Notes (Signed)
 EDP given benzocaine  lidocaine  and bupivicaine

## 2024-03-21 NOTE — ED Provider Notes (Signed)
 Sara Foley    CSN: [id number] Arrival date & time: 03/21/24  1125      History   Chief Complaint Chief Complaint  Patient presents with   Abscess    HPI Sara Foley is a 58 y.o. female.  Patient presents with 3-day history of painful swelling of her left labia.  She has been treating it with warm compresses and Tylenol .  She began to have malodorous drainage from the area this morning.  She denies fever or chills.  The history is provided by the patient and medical records.    Past Medical History:  Diagnosis Date   Abnormal mammogram, unspecified 2013   illdefined left inferior density; ultrasound was normal   Gestational diabetes    1996   Hidradenitis 2013   hidradentitis suppurativa   Obesity, unspecified    Unspecified essential hypertension     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 2 diabetes mellitus (HCC) 06/21/2021   Metabolic syndrome 02/28/2016   Low HDL (under 40) 02/28/2016   History of hysterectomy leaving cervix intact 01/19/2016   Vitamin D  deficiency 01/18/2016   Vitamin B12 deficiency 01/18/2016   History of gestational diabetes 01/17/2016   Hypertension, benign 01/17/2016   Hidradenitis suppurativa 01/17/2016   History of iron deficiency anemia 01/17/2016    Past Surgical History:  Procedure Laterality Date   BREAST BIOPSY Left 04/14/2012   left breast stereotactic biopsy; fibroadenoma   BREAST REDUCTION SURGERY  1999   HYDRADENITIS EXCISION Left 06/05/2019   Procedure: EXCISION HIDRADENITIS GROIN;  Surgeon: Marolyn Nest, MD;  Location: [facility] ORS;  Service: General;  Laterality: Left;   REDUCTION MAMMAPLASTY Bilateral 2000   SUPRACERVICAL ABDOMINAL HYSTERECTOMY  01/2012   due to fibroid tumors    OB History     Gravida  3   Para      Term      Preterm      AB      Living  3      SAB      IAB      Ectopic      Multiple       Live Births           Obstetric Comments  Age first menstrual cycle 18 Age first pregnancy 88          Home Medications    Prior to Admission medications   Medication Sig Start Date End Date Taking? Authorizing Provider  atorvastatin  (LIPITOR) 10 MG tablet Take 1 tablet (10 mg total) by mouth daily. 01/13/24  Yes Sowles, Krichna, MD  Cyanocobalamin  (B-12) 1000 MCG LOZG Place 1 tablet under the tongue daily at 12 noon. DISSOLVE ONE TABLET UNDER THE TONGUE DAILY 01/13/24  Yes Sowles, Krichna, MD  empagliflozin  (JARDIANCE ) 25 MG TABS tablet Take 1 tablet (25 mg total) by mouth daily before breakfast. 01/13/24  Yes Sowles, Krichna, MD  losartan  (COZAAR ) 50 MG tablet Take 1 tablet (50 mg total) by mouth daily. 01/13/24  Yes Sowles, Krichna, MD  Misc Natural Products (BEET ROOT) 500 MG CAPS Take 1 each by mouth daily after breakfast. 12/26/23  Yes Sowles, Krichna, MD  Cholecalciferol (VITAMIN D ) 50 MCG (2000 UT) CAPS Take 1 capsule (2,000 Units total) by mouth daily after breakfast. Patient not taking: Reported on 03/12/2024 01/13/24   Sowles, Krichna, MD    Family History Family History  Problem Relation Age of Onset  Prostate cancer Other    Colon cancer Maternal Uncle    Diabetes Mother    Arthritis/Rheumatoid Mother    Leukemia Father     Social History Social History   Tobacco Use   Smoking status: Never   Smokeless tobacco: Never  Vaping Use   Vaping status: Never Used  Substance Use Topics   Alcohol use: Yes    Alcohol/week: 3.0 standard drinks of alcohol    Types: 3 Standard drinks or equivalent per week   Drug use: No     Allergies   Metformin    Review of Systems Review of Systems  Constitutional:  Negative for chills and fever.  Gastrointestinal:  Negative for abdominal pain.  Genitourinary:  Negative for dysuria and hematuria.       Labial abscess with pus drainage.  Skin:  Positive for color change and wound.     Physical Exam Triage Vital Signs ED  Triage Vitals  Encounter Vitals Group     BP 03/21/24 1136 124/86     Girls Systolic BP Percentile --      Girls Diastolic BP Percentile --      Boys Systolic BP Percentile --      Boys Diastolic BP Percentile --      Pulse Rate 03/21/24 1136 (!) 107     Resp --      Temp 03/21/24 1136 98.1 F (36.7 C)     Temp Source 03/21/24 1136 Oral     SpO2 03/21/24 1136 98 %     Weight 03/21/24 1138 192 lb (87.1 kg)     Height 03/21/24 1138 5' 5 (1.651 m)     Head Circumference --      Peak Flow --      Pain Score 03/21/24 1138 8     Pain Loc --      Pain Education --      Exclude from Growth Chart --    No data found.  Updated Vital Signs BP 124/86 (BP Location: Left Arm)   Pulse (!) 107   Temp 98.1 F (36.7 C) (Oral)   Ht 5' 5 (1.651 m)   Wt 192 lb (87.1 kg)   SpO2 98%   BMI 31.95 kg/m   Visual Acuity Right Eye Distance:   Left Eye Distance:   Bilateral Distance:    Right Eye Near:   Left Eye Near:    Bilateral Near:     Physical Exam Exam conducted with a chaperone present Harlan, CMA).  Constitutional:      General: She is not in acute distress. HENT:     Mouth/Throat:     Mouth: Mucous membranes are moist.  Cardiovascular:     Rate and Rhythm: Regular rhythm. Tachycardia present.     Heart sounds: Normal heart sounds.  Pulmonary:     Effort: Pulmonary effort is normal. No respiratory distress.     Breath sounds: Normal breath sounds.  Genitourinary:     Comments: Very large, tender labial abscess with three open skin breaks in skin fold between labia and thigh with purulent drainage.  Neurological:     Mental Status: She is alert.      UC Treatments / Results  Labs (all labs ordered are listed, but only abnormal results are displayed) Labs Reviewed - No data to display  EKG   Radiology No results found.  Procedures Procedures (including critical care time)  Medications Ordered in UC Medications - No data to display  Initial Impression /  Assessment and Plan / UC Course  I have reviewed the triage vital signs and the nursing notes.  Pertinent labs & imaging results that were available during my care of the patient were reviewed by me and considered in my medical decision making (see chart for details).    Large left labial abscess, tachycardia.  Based on the size of the abscess and patient's tachycardia, sending her to the ED for evaluation.  I am concerned that she may need stat lab work, possible IV antibiotics, possible surgical consult.  She is agreeable to this and will go to [facility] ED now.  Final Clinical Impressions(s) / UC Diagnoses   Final diagnoses:  Left genital labial abscess  Tachycardia     Discharge Instructions      Go to the emergency department for evaluation of the very large labial abscess.     ED Prescriptions   None    PDMP not reviewed this encounter.   Corlis Burnard DEL, NP 03/21/24 [phone]

## 2024-03-21 NOTE — ED Notes (Signed)
 Patient is being discharged from the Urgent Care and sent to the Emergency Department via private vehicle . Per Sara Foley patient is in need of higher level of care due to further evaluation of vaginal abscess. Patient is aware and verbalizes understanding of plan of care.  Vitals:   03/21/24 1136  BP: 124/86  Pulse: (!) 107  Temp: 98.1 F (36.7 C)  SpO2: 98%

## 2024-03-24 ENCOUNTER — Encounter: Payer: Self-pay | Admitting: Family Medicine

## 2024-03-25 ENCOUNTER — Ambulatory Visit: Admitting: Family Medicine

## 2024-03-25 ENCOUNTER — Encounter: Payer: Self-pay | Admitting: Family Medicine

## 2024-03-25 VITALS — BP 124/76 | HR 92 | Resp 16 | Ht 65.0 in | Wt 217.3 lb

## 2024-03-25 DIAGNOSIS — N764 Abscess of vulva: Secondary | ICD-10-CM

## 2024-03-25 DIAGNOSIS — L732 Hidradenitis suppurativa: Secondary | ICD-10-CM | POA: Diagnosis not present

## 2024-03-25 NOTE — Progress Notes (Signed)
 Name: Sara Foley   MRN: [id number]    DOB: [birth year]   Date:03/25/2024       Progress Note  Subjective  Chief Complaint  Chief Complaint  Patient presents with   Abscess    On L labia    Discussed the use of AI scribe software for clinical note transcription with the patient, who gave verbal consent to proceed.  History of Present Illness Sara Foley is a 58 year old female with hidradenitis suppurativa who presents with a vulvar abscess and cellulitis.  She initially sought care at urgent care on March 21, 2024, and was referred to the emergency room due to the size and depth of the abscess. She reports that a CT scan was performed in the ER, and she was told there was a large fluid collection in the left labia, described as 'like a golf ball'.  The abscess developed rapidly, with initial soreness on a Wednesday, escalating to severe pain by Friday, making it difficult to walk. Despite the pain, she attempted to continue working, as the first day of school was approaching. The abscess was drained in the ER, and she was diagnosed with left labia cellulitis. She received IV morphine  for pain and a Rocephin  shot.  She was prescribed Septra  DS, which she started on March 22, 2024. There is minimal drainage from the abscess site currently. She has been keeping the area covered with gauze and is concerned about the proper care post-removal.  No fever or chills. She experienced severe pain and difficulty walking due to the abscess. She was excused from work on Monday and Tuesday but chose to attend due to the importance of the first day of school, despite significant discomfort.    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 2 diabetes mellitus (HCC) 06/21/2021   Metabolic syndrome 02/28/2016   Low HDL (under 40) 02/28/2016   History of hysterectomy leaving cervix intact 01/19/2016   Vitamin D  deficiency  01/18/2016   Vitamin B12 deficiency 01/18/2016   History of gestational diabetes 01/17/2016   Hypertension, benign 01/17/2016   Hidradenitis suppurativa 01/17/2016   History of iron deficiency anemia 01/17/2016    Social History   Tobacco Use   Smoking status: Never   Smokeless tobacco: Never  Substance Use Topics   Alcohol use: Yes    Alcohol/week: 3.0 standard drinks of alcohol    Types: 3 Standard drinks or equivalent per week     Current Outpatient Medications:    atorvastatin  (LIPITOR) 10 MG tablet, Take 1 tablet (10 mg total) by mouth daily., Disp: 90 tablet, Rfl: 1   Cyanocobalamin  (B-12) 1000 MCG LOZG, Place 1 tablet under the tongue daily at 12 noon. DISSOLVE ONE TABLET UNDER THE TONGUE DAILY, Disp: 100 lozenge, Rfl: 1   empagliflozin  (JARDIANCE ) 25 MG TABS tablet, Take 1 tablet (25 mg total) by mouth daily before breakfast., Disp: 90 tablet, Rfl: 0   losartan  (COZAAR ) 50 MG tablet, Take 1 tablet (50 mg total) by mouth daily., Disp: 90 tablet, Rfl: 1   Misc Natural Products (BEET ROOT) 500 MG CAPS, Take 1 each by mouth daily after breakfast., Disp: 100 capsule, Rfl: 3   sulfamethoxazole -trimethoprim  (BACTRIM  DS) 800-160 MG tablet, Take 1 tablet by mouth 2 (two) times daily., Disp: 20 tablet, Rfl: 0   Cholecalciferol (VITAMIN D ) 50 MCG (2000 UT) CAPS, Take 1 capsule (2,000 Units total) by mouth daily after  breakfast. (Patient not taking: Reported on 03/25/2024), Disp: 100 capsule, Rfl: 1  Allergies  Allergen Reactions   Metformin  Diarrhea    ROS  Ten systems reviewed and is negative except as mentioned in HPI    Objective  Vitals:   03/25/24 1402  BP: 124/76  Pulse: 92  Resp: 16  SpO2: 97%  Weight: 217 lb 4.8 oz (98.6 kg)  Height: 5' 5 (1.651 m)    Body mass index is 36.16 kg/m.    Physical Exam CONSTITUTIONAL: Patient appears well-developed and well-nourished. No distress. HEENT: Head atraumatic, normocephalic, neck supple. CARDIOVASCULAR: Normal  rate, regular rhythm and normal heart sounds. No murmur heard. No BLE edema. PULMONARY: Effort normal and breath sounds normal. No respiratory distress. PSYCHIATRIC: Patient has a normal mood and affect. Behavior is normal. Judgment and thought content normal. GENITOURINARY: Induration present in the left labia with minimal drainage, packing material removed with purulent discharge   Recent Results (from the past 2160 hours)  POCT glycosylated hemoglobin (Hb A1C)     Status: Abnormal   Collection Time: 01/13/24  9:51 AM  Result Value Ref Range   Hemoglobin A1C 7.8 (A) 4.0 - 5.6 %   HbA1c POC (<> result, manual entry)     HbA1c, POC (prediabetic range)     HbA1c, POC (controlled diabetic range)    Hemoglobin A1c     Status: None   Collection Time: 02/26/24 12:00 AM  Result Value Ref Range   Hemoglobin A1C 6.6   Cytology - PAP     Status: Abnormal   Collection Time: 03/12/24 11:11 AM  Result Value Ref Range   High risk HPV Negative    Adequacy Satisfactory for evaluation.    Diagnosis (A)     - Atypical squamous cells of undetermined significance (ASC-US )   Comment Normal Reference Range HPV - Negative   Comprehensive metabolic panel     Status: Abnormal   Collection Time: 03/21/24  2:17 PM  Result Value Ref Range   Sodium 140 135 - 145 mmol/L   Potassium 3.3 (L) 3.5 - 5.1 mmol/L   Chloride 107 98 - 111 mmol/L   CO2 23 22 - 32 mmol/L   Glucose, Bld 192 (H) 70 - 99 mg/dL    Comment: Glucose reference range applies only to samples taken after fasting for at least 8 hours.   BUN 11 6 - 20 mg/dL   Creatinine, Ser 9.42 0.44 - 1.00 mg/dL   Calcium  9.1 8.9 - 10.3 mg/dL   Total Protein 8.2 (H) 6.5 - 8.1 g/dL   Albumin 3.6 3.5 - 5.0 g/dL   AST 21 15 - 41 U/L   ALT 16 0 - 44 U/L   Alkaline Phosphatase 104 38 - 126 U/L   Total Bilirubin 0.5 0.0 - 1.2 mg/dL   GFR, Estimated >39 >39 mL/min    Comment: (NOTE) Calculated using the CKD-EPI Creatinine Equation (2021)    Anion gap 10 5 -  15    Comment: Performed at [facility], [address] [place], [place] [address]  CBC with Differential     Status: Abnormal   Collection Time: 03/21/24  2:17 PM  Result Value Ref Range   WBC 11.1 (H) 4.0 - 10.5 K/uL   RBC 5.21 (H) 3.87 - 5.11 MIL/uL   Hemoglobin 13.7 12.0 - 15.0 g/dL   HCT 55.5 63.9 - 53.9 %   MCV 85.2 80.0 - 100.0 fL   MCH 26.3 26.0 - 34.0 pg  MCHC 30.9 30.0 - 36.0 g/dL   RDW 85.4 88.4 - 84.4 %   Platelets 282 150 - 400 K/uL   nRBC 0.0 0.0 - 0.2 %   Neutrophils Relative % 85 %   Neutro Abs 9.5 (H) 1.7 - 7.7 K/uL   Lymphocytes Relative 9 %   Lymphs Abs 1.0 0.7 - 4.0 K/uL   Monocytes Relative 5 %   Monocytes Absolute 0.5 0.1 - 1.0 K/uL   Eosinophils Relative 1 %   Eosinophils Absolute 0.1 0.0 - 0.5 K/uL   Basophils Relative 0 %   Basophils Absolute 0.0 0.0 - 0.1 K/uL   Immature Granulocytes 0 %   Abs Immature Granulocytes 0.04 0.00 - 0.07 K/uL    Comment: Performed at [facility], [address] [place], [place] [address]  Lactic acid, plasma     Status: None   Collection Time: 03/21/24  2:17 PM  Result Value Ref Range   Lactic Acid, Venous 1.3 0.5 - 1.9 mmol/L    Comment: Performed at [facility], [address], [place], [place] [address]      Assessment & Plan Vulvar abscess and cellulitis of left labia in the setting of hidradenitis suppurativa Presented with a significant vulvar abscess and cellulitis due to hidradenitis suppurativa. CT confirmed superficial abscess, treated with IV Rocephin  and morphine . Currently on oral Septra  DS. Wound still draining slightly, no systemic infection signs, but area remains indurated. - Remove wound packing per ER instructions. - Advise warm compresses with warm water and Clorox bleach to promote drainage and reduce inflammation. 1 tsp in 24 oz of warm water - Continue Septra  DS as prescribed. - Advise against Betadine due to potential irritation. - Refer to general surgery for  follow-up and management of hidradenitis suppurativa. - Advise scheduling follow-up with general surgeon for ongoing care.

## 2024-04-06 ENCOUNTER — Ambulatory Visit: Payer: Self-pay | Admitting: Surgery

## 2024-04-06 ENCOUNTER — Telehealth: Payer: Self-pay

## 2024-04-06 NOTE — Telephone Encounter (Signed)
 Called patient and left a detailed message about this and asked that she call us  with any questions.

## 2024-04-06 NOTE — Telephone Encounter (Signed)
-----   Message from [facility] sent at 04/03/2024  1:38 PM EDT ----- Regarding: wrong follow up Hi,  This patient is on my schedule for Monday for follow up of a vulvar abscess.  She was seen in the ED for a left labial abscess and they did I&D of this area.  She needs to follow up with gynecology, not with general surgery for this.  Can you please contact the patient to let her know?  Appointment with me can be canceled.    Thanks,  [employer]

## 2024-04-08 ENCOUNTER — Other Ambulatory Visit: Payer: Self-pay | Admitting: Family Medicine

## 2024-04-08 DIAGNOSIS — E785 Hyperlipidemia, unspecified: Secondary | ICD-10-CM

## 2024-04-15 ENCOUNTER — Ambulatory Visit: Admitting: Family Medicine

## 2024-05-11 ENCOUNTER — Encounter: Payer: Self-pay | Admitting: Family Medicine

## 2024-05-12 ENCOUNTER — Ambulatory Visit: Admitting: Family Medicine

## 2024-05-12 ENCOUNTER — Encounter: Payer: Self-pay | Admitting: Family Medicine

## 2024-05-12 VITALS — BP 114/72 | HR 96 | Resp 16 | Ht 65.0 in | Wt 218.9 lb

## 2024-05-12 DIAGNOSIS — E538 Deficiency of other specified B group vitamins: Secondary | ICD-10-CM | POA: Diagnosis not present

## 2024-05-12 DIAGNOSIS — I10 Essential (primary) hypertension: Secondary | ICD-10-CM

## 2024-05-12 DIAGNOSIS — E119 Type 2 diabetes mellitus without complications: Secondary | ICD-10-CM

## 2024-05-12 DIAGNOSIS — E559 Vitamin D deficiency, unspecified: Secondary | ICD-10-CM

## 2024-05-12 DIAGNOSIS — E1169 Type 2 diabetes mellitus with other specified complication: Secondary | ICD-10-CM

## 2024-05-12 DIAGNOSIS — Z7984 Long term (current) use of oral hypoglycemic drugs: Secondary | ICD-10-CM

## 2024-05-12 DIAGNOSIS — L732 Hidradenitis suppurativa: Secondary | ICD-10-CM

## 2024-05-12 DIAGNOSIS — Z1159 Encounter for screening for other viral diseases: Secondary | ICD-10-CM

## 2024-05-12 DIAGNOSIS — E785 Hyperlipidemia, unspecified: Secondary | ICD-10-CM

## 2024-05-12 MED ORDER — EMPAGLIFLOZIN 25 MG PO TABS: 25.0000 mg | ORAL_TABLET | Freq: Every day | ORAL | 1 refills | Status: AC

## 2024-05-12 MED ORDER — LOSARTAN POTASSIUM 50 MG PO TABS: 50.0000 mg | ORAL_TABLET | Freq: Every day | ORAL | 1 refills | Status: AC

## 2024-05-12 MED ORDER — ATORVASTATIN CALCIUM 10 MG PO TABS: 10.0000 mg | ORAL_TABLET | Freq: Every day | ORAL | 1 refills | Status: AC

## 2024-05-12 NOTE — Progress Notes (Signed)
 Name: Sara Foley   MRN: [id number]    DOB: 06-08-1966   Date:05/12/2024       Progress Note  Subjective  Chief Complaint  Chief Complaint  Patient presents with   Medical Management of Chronic Issues   Discussed the use of AI scribe software for clinical note transcription with the patient, who gave verbal consent to proceed.  History of Present Illness Sara Foley is a 58 year old female with diabetes, hypertension, and dyslipidemia who presents for a regular follow-up visit.  She has been managing her diabetes with Jardiance , which has improved her A1c from 7.8% in March to 6.6% recently. She is actively participating in a 16-week diabetes management program, tracking her diet and physical activity. She achieves 3,000 to 5,000 steps daily and is more mindful of her diet, although she still consumes one Pepsi daily. No symptoms of uncontrolled diabetes such as excessive hunger, thirst, or frequent urination. She is more mindful of her water intake.  She takes losartan  50 mg daily for hypertension, and her recent blood pressure readings have been 114/72 mmHg.  She has been taking atorvastatin  for dyslipidemia   She has a history of hidradenitis suppurativa, previously inflamed and requiring a visit to the emergency room. The condition was located on the vulva and was treated with antibiotics, resolving the issue. Her white blood cell count was high, and potassium was low at that time, but she has not had a recurrence of symptoms.  She is not currently taking B12 or vitamin D  supplements, although her levels were low last year. She has not experienced any stomach issues since being treated for H. pylori, which may have improved her nutrient absorption.  Her BMI remains over 35, and her weight is stable at approximately 218 lbs, she is trying to be more active and eating more mindfully.   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 She is participating in a  diabetes management program through her church.    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 2 diabetes mellitus (HCC) 06/21/2021   Metabolic syndrome 02/28/2016   Low HDL (under 40) 02/28/2016   History of hysterectomy leaving cervix intact 01/19/2016   Vitamin D  deficiency 01/18/2016   Vitamin B12 deficiency 01/18/2016   History of gestational diabetes 01/17/2016   Hypertension, benign 01/17/2016   Hidradenitis suppurativa 01/17/2016   History of iron deficiency anemia 01/17/2016    Past Surgical History:  Procedure Laterality Date   BREAST BIOPSY Left 04/14/2012   left breast stereotactic biopsy; fibroadenoma   BREAST REDUCTION SURGERY  1999   HYDRADENITIS EXCISION Left 06/05/2019   Procedure: EXCISION HIDRADENITIS GROIN;  Surgeon: Marolyn Nest, MD;  Location: [facility] ORS;  Service: General;  Laterality: Left;   REDUCTION MAMMAPLASTY Bilateral 2000   SUPRACERVICAL ABDOMINAL HYSTERECTOMY  01/2012   due to fibroid tumors    Family History  Problem Relation Age of Onset   Prostate cancer Other    Colon cancer Maternal Uncle    Diabetes Mother    Arthritis/Rheumatoid Mother    Leukemia Father     Social History   Tobacco Use   Smoking status: Never   Smokeless tobacco: Never  Substance Use Topics   Alcohol use: Yes    Alcohol/week: 3.0 standard drinks of alcohol    Types: 3 Standard drinks or equivalent per week  Current Outpatient Medications:    atorvastatin  (LIPITOR) 10 MG tablet, Take 1 tablet (10 mg total) by mouth daily., Disp: 90 tablet, Rfl: 1   Cholecalciferol (VITAMIN D ) 50 MCG (2000 UT) CAPS, Take 1 capsule (2,000 Units total) by mouth daily after breakfast., Disp: 100 capsule, Rfl: 1   Cyanocobalamin  (B-12) 1000 MCG LOZG, Place 1 tablet under the tongue daily at 12 noon. DISSOLVE ONE TABLET UNDER THE TONGUE DAILY, Disp: 100 lozenge, Rfl: 1   empagliflozin  (JARDIANCE ) 25 MG TABS  tablet, TAKE 1 TABLET BY MOUTH ONCE DAILY BEFORE BREAKFAST, Disp: 30 tablet, Rfl: 0   losartan  (COZAAR ) 50 MG tablet, Take 1 tablet (50 mg total) by mouth daily., Disp: 90 tablet, Rfl: 1   Misc Natural Products (BEET ROOT) 500 MG CAPS, Take 1 each by mouth daily after breakfast., Disp: 100 capsule, Rfl: 3  Allergies  Allergen Reactions   Metformin  Diarrhea    I personally reviewed active problem list, medication list, allergies, family history with the patient/caregiver today.   ROS  Ten systems reviewed and is negative except as mentioned in HPI    Objective Physical Exam VITALS: BP- 114/72 MEASUREMENTS: Weight- 218, BMI- 35.0. CONSTITUTIONAL: Patient appears well-developed and well-nourished.  No distress. HEENT: Head atraumatic, normocephalic, neck supple. CARDIOVASCULAR: Normal rate, regular rhythm and normal heart sounds.  No murmur heard. No BLE edema. PULMONARY: Effort normal and breath sounds normal. No respiratory distress. ABDOMINAL: There is no tenderness or distention. MUSCULOSKELETAL: Normal gait. Without gross motor or sensory deficit. PSYCHIATRIC: Patient has a normal mood and affect. behavior is normal. Judgment and thought content normal.  Vitals:   05/12/24 1504  BP: 114/72  Pulse: 96  Resp: 16  SpO2: 97%  Weight: 218 lb 14.4 oz (99.3 kg)  Height: 5' 5 (1.651 m)    Body mass index is 36.43 kg/m.  Recent Results (from the past 2160 hours)  Hemoglobin A1c     Status: None   Collection Time: 02/26/24 12:00 AM  Result Value Ref Range   Hemoglobin A1C 6.6   Cytology - PAP     Status: Abnormal   Collection Time: 03/12/24 11:11 AM  Result Value Ref Range   High risk HPV Negative    Adequacy Satisfactory for evaluation.    Diagnosis (A)     - Atypical squamous cells of undetermined significance (ASC-US )   Comment Normal Reference Range HPV - Negative   Comprehensive metabolic panel     Status: Abnormal   Collection Time: 03/21/24  2:17 PM  Result  Value Ref Range   Sodium 140 135 - 145 mmol/L   Potassium 3.3 (L) 3.5 - 5.1 mmol/L   Chloride 107 98 - 111 mmol/L   CO2 23 22 - 32 mmol/L   Glucose, Bld 192 (H) 70 - 99 mg/dL    Comment: Glucose reference range applies only to samples taken after fasting for at least 8 hours.   BUN 11 6 - 20 mg/dL   Creatinine, Ser 9.42 0.44 - 1.00 mg/dL   Calcium  9.1 8.9 - 10.3 mg/dL   Total Protein 8.2 (H) 6.5 - 8.1 g/dL   Albumin 3.6 3.5 - 5.0 g/dL   AST 21 15 - 41 U/L   ALT 16 0 - 44 U/L   Alkaline Phosphatase 104 38 - 126 U/L   Total Bilirubin 0.5 0.0 - 1.2 mg/dL   GFR, Estimated >39 >39 mL/min    Comment: (NOTE) Calculated using the CKD-EPI Creatinine Equation (2021)    Anion  gap 10 5 - 15    Comment: Performed at [facility], [address] [place], [place] [address]  CBC with Differential     Status: Abnormal   Collection Time: 03/21/24  2:17 PM  Result Value Ref Range   WBC 11.1 (H) 4.0 - 10.5 K/uL   RBC 5.21 (H) 3.87 - 5.11 MIL/uL   Hemoglobin 13.7 12.0 - 15.0 g/dL   HCT 55.5 63.9 - 53.9 %   MCV 85.2 80.0 - 100.0 fL   MCH 26.3 26.0 - 34.0 pg   MCHC 30.9 30.0 - 36.0 g/dL   RDW 85.4 88.4 - 84.4 %   Platelets 282 150 - 400 K/uL   nRBC 0.0 0.0 - 0.2 %   Neutrophils Relative % 85 %   Neutro Abs 9.5 (H) 1.7 - 7.7 K/uL   Lymphocytes Relative 9 %   Lymphs Abs 1.0 0.7 - 4.0 K/uL   Monocytes Relative 5 %   Monocytes Absolute 0.5 0.1 - 1.0 K/uL   Eosinophils Relative 1 %   Eosinophils Absolute 0.1 0.0 - 0.5 K/uL   Basophils Relative 0 %   Basophils Absolute 0.0 0.0 - 0.1 K/uL   Immature Granulocytes 0 %   Abs Immature Granulocytes 0.04 0.00 - 0.07 K/uL    Comment: Performed at [facility], [address] [place], [place] [address]  Lactic acid, plasma     Status: None   Collection Time: 03/21/24  2:17 PM  Result Value Ref Range   Lactic Acid, Venous 1.3 0.5 - 1.9 mmol/L    Comment: Performed at [facility], [address] [place], [place]  [address]    Diabetic Foot Exam:     PHQ2/9:    05/12/2024    3:01 PM 03/25/2024    1:54 PM 03/12/2024   10:46 AM 01/13/2024    9:35 AM 10/11/2023    8:18 AM  Depression screen PHQ 2/9  Decreased Interest 0 0 0 0 0  Down, Depressed, Hopeless 0 0 0 0 0  PHQ - 2 Score 0 0 0 0 0  Altered sleeping     0  Tired, decreased energy     0  Change in appetite     0  Feeling bad or failure about yourself      0  Trouble concentrating     0  Moving slowly or fidgety/restless     0  Suicidal thoughts     0  PHQ-9 Score     0  Difficult doing work/chores     Not difficult at all    phq 9 is negative  Fall Risk:    05/12/2024    3:01 PM 03/25/2024    1:54 PM 03/12/2024   10:46 AM 01/13/2024    9:34 AM 05/07/2023    7:56 AM  Fall Risk   Falls in the past year? 0 0 0 0 0  Number falls in past yr: 0 0 0 0 0  Injury with Fall? 0 0 0 0 0  Risk for fall due to : No Fall Risks No Fall Risks No Fall Risks No Fall Risks No Fall Risks  Follow up Falls evaluation completed Falls evaluation completed Falls evaluation completed Falls prevention discussed;Education provided;Falls evaluation completed Falls prevention discussed      Assessment & Plan Type 2 diabetes mellitus with obesity, HTN syndrome and dyslipidemia  A1c improved from 7.8% to 6.6% with current management.  - Continue Jardiance . - Encourage participation  in diabetes management program. - Encourage dietary modifications and increased physical activity. - Order A1c test in November.  Hypertension Blood pressure controlled at 114/72 mmHg with losartan  and Jardiance . - Continue losartan  50 mg. - Consider reducing losartan  to 25 mg if blood pressure continues to improve.  Dyslipidemia Managed with atorvastatin . No recent lipid panel results. - Continue atorvastatin . - Order lipid panel in November.  Obesity Morbid due to BMI over 35 with co-morbidity such as DM BMI over 35, weight 218 lbs. Participating in [employer] management  program. - Encourage continued participation in weight management program. - Encourage dietary modifications and increased physical activity.  Vitamin D  deficiency Previous low levels, not on supplements. - Recommend starting over-the-counter vitamin D  supplementation.  Vitamin B12 deficiency Previous low levels, not on supplements. - Recommend starting over-the-counter B12 supplementation if levels are low.

## 2024-06-10 ENCOUNTER — Ambulatory Visit: Payer: Self-pay | Admitting: Family Medicine

## 2024-06-10 LAB — CBC WITH DIFFERENTIAL/PLATELET
Absolute Lymphocytes: 949 {cells}/uL (ref 850–3900)
Absolute Monocytes: 292 {cells}/uL (ref 200–950)
Basophils Absolute: 21 {cells}/uL (ref 0–200)
Basophils Relative: 0.4 %
Eosinophils Absolute: 122 {cells}/uL (ref 15–500)
Eosinophils Relative: 2.3 %
HCT: 44.7 % (ref 35.0–45.0)
Hemoglobin: 14 g/dL (ref 11.7–15.5)
MCH: 25.9 pg — ABNORMAL LOW (ref 27.0–33.0)
MCHC: 31.3 g/dL — ABNORMAL LOW (ref 32.0–36.0)
MCV: 82.6 fL (ref 80.0–100.0)
MPV: 10.8 fL (ref 7.5–12.5)
Monocytes Relative: 5.5 %
Neutro Abs: 3917 {cells}/uL (ref 1500–7800)
Neutrophils Relative %: 73.9 %
Platelets: 293 Thousand/uL (ref 140–400)
RBC: 5.41 Million/uL — ABNORMAL HIGH (ref 3.80–5.10)
RDW: 13.9 % (ref 11.0–15.0)
Total Lymphocyte: 17.9 %
WBC: 5.3 Thousand/uL (ref 3.8–10.8)

## 2024-06-10 LAB — HEMOGLOBIN A1C
Hgb A1c MFr Bld: 6.7 % — ABNORMAL HIGH (ref <5.7)
Mean Plasma Glucose: 146 mg/dL
eAG (mmol/L): 8.1 mmol/L

## 2024-06-10 LAB — COMPREHENSIVE METABOLIC PANEL WITH GFR
AG Ratio: 1.1 (calc) (ref 1.0–2.5)
ALT: 17 U/L (ref 6–29)
AST: 17 U/L (ref 10–35)
Albumin: 4 g/dL (ref 3.6–5.1)
Alkaline phosphatase (APISO): 122 U/L (ref 37–153)
BUN: 10 mg/dL (ref 7–25)
CO2: 26 mmol/L (ref 20–32)
Calcium: 9 mg/dL (ref 8.6–10.4)
Chloride: 105 mmol/L (ref 98–110)
Creat: 0.62 mg/dL (ref 0.50–1.03)
Globulin: 3.7 g/dL (ref 1.9–3.7)
Glucose, Bld: 111 mg/dL — ABNORMAL HIGH (ref 65–99)
Potassium: 4.3 mmol/L (ref 3.5–5.3)
Sodium: 140 mmol/L (ref 135–146)
Total Bilirubin: 0.5 mg/dL (ref 0.2–1.2)
Total Protein: 7.7 g/dL (ref 6.1–8.1)
eGFR: 104 mL/min/1.73m2 (ref >=60)

## 2024-06-10 LAB — B12 AND FOLATE PANEL
Folate: 10.1 ng/mL
Vitamin B-12: 361 pg/mL (ref 200–1100)

## 2024-06-10 LAB — HEPATITIS B SURFACE ANTIBODY,QUALITATIVE: Hep B S Ab: NONREACTIVE

## 2024-06-10 LAB — EXTRA

## 2024-06-10 LAB — LIPID PANEL
Cholesterol: 95 mg/dL (ref <200)
HDL: 33 mg/dL — ABNORMAL LOW (ref >=50)
LDL Cholesterol (Calc): 48 mg/dL
Non-HDL Cholesterol (Calc): 62 mg/dL (ref <130)
Total CHOL/HDL Ratio: 2.9 (calc) (ref <5.0)
Triglycerides: 64 mg/dL (ref <150)

## 2024-06-10 LAB — MICROALBUMIN / CREATININE URINE RATIO
Creatinine, Urine: 159 mg/dL (ref 20–275)
Microalb Creat Ratio: 10 mg/g{creat} (ref <30)
Microalb, Ur: 1.6 mg/dL

## 2024-06-10 LAB — VITAMIN D 25 HYDROXY (VIT D DEFICIENCY, FRACTURES): Vit D, 25-Hydroxy: 18 ng/mL — ABNORMAL LOW (ref 30–100)

## 2024-10-12 ENCOUNTER — Ambulatory Visit: Admitting: Family Medicine

## 2025-03-16 ENCOUNTER — Encounter: Admitting: Family Medicine
